# Patient Record
Sex: Female | Born: 1950
Health system: Southern US, Community
[De-identification: ages and names within clinical notes are randomized; demographics above are authoritative.]

## PROBLEM LIST (undated history)

## (undated) DIAGNOSIS — I10 Essential (primary) hypertension: Secondary | ICD-10-CM

## (undated) DIAGNOSIS — R0981 Nasal congestion: Secondary | ICD-10-CM

## (undated) DIAGNOSIS — E079 Disorder of thyroid, unspecified: Secondary | ICD-10-CM

## (undated) DIAGNOSIS — G20 Parkinson's disease: Secondary | ICD-10-CM

## (undated) DIAGNOSIS — G20A1 Parkinson's disease without dyskinesia, without mention of fluctuations: Secondary | ICD-10-CM

## (undated) DIAGNOSIS — G238 Other specified degenerative diseases of basal ganglia: Secondary | ICD-10-CM

## (undated) HISTORY — PX: TUBAL LIGATION: SHX77

## (undated) HISTORY — PX: CATARACT EXTRACTION, BILATERAL: SHX1313

## (undated) HISTORY — PX: NASAL SINUS SURGERY: SHX719

## (undated) HISTORY — DX: Nasal congestion: R09.81

---

## 1999-08-20 ENCOUNTER — Encounter: Payer: Self-pay | Admitting: Gynecology

## 1999-08-20 ENCOUNTER — Encounter: Admission: RE | Admit: 1999-08-20 | Discharge: 1999-08-20 | Payer: Self-pay | Admitting: Gynecology

## 1999-12-24 ENCOUNTER — Other Ambulatory Visit: Admission: RE | Admit: 1999-12-24 | Discharge: 1999-12-24 | Payer: Self-pay | Admitting: Gynecology

## 2000-11-08 ENCOUNTER — Encounter: Admission: RE | Admit: 2000-11-08 | Discharge: 2000-11-08 | Payer: Self-pay | Admitting: Gynecology

## 2000-11-08 ENCOUNTER — Encounter: Payer: Self-pay | Admitting: Gynecology

## 2001-01-04 ENCOUNTER — Other Ambulatory Visit: Admission: RE | Admit: 2001-01-04 | Discharge: 2001-01-04 | Payer: Self-pay | Admitting: Gynecology

## 2002-08-15 ENCOUNTER — Other Ambulatory Visit: Admission: RE | Admit: 2002-08-15 | Discharge: 2002-08-15 | Payer: Self-pay | Admitting: Gynecology

## 2003-08-01 ENCOUNTER — Encounter: Admission: RE | Admit: 2003-08-01 | Discharge: 2003-08-01 | Payer: Self-pay | Admitting: Gynecology

## 2003-08-19 ENCOUNTER — Other Ambulatory Visit: Admission: RE | Admit: 2003-08-19 | Discharge: 2003-08-19 | Payer: Self-pay | Admitting: Gynecology

## 2004-09-22 ENCOUNTER — Other Ambulatory Visit: Admission: RE | Admit: 2004-09-22 | Discharge: 2004-09-22 | Payer: Self-pay | Admitting: Gynecology

## 2005-07-27 ENCOUNTER — Encounter: Admission: RE | Admit: 2005-07-27 | Discharge: 2005-07-27 | Payer: Self-pay | Admitting: Gynecology

## 2005-08-12 ENCOUNTER — Encounter: Admission: RE | Admit: 2005-08-12 | Discharge: 2005-08-12 | Payer: Self-pay | Admitting: Gynecology

## 2005-08-25 ENCOUNTER — Encounter: Admission: RE | Admit: 2005-08-25 | Discharge: 2005-08-25 | Payer: Self-pay | Admitting: Gynecology

## 2005-10-05 ENCOUNTER — Other Ambulatory Visit: Admission: RE | Admit: 2005-10-05 | Discharge: 2005-10-05 | Payer: Self-pay | Admitting: Gynecology

## 2006-10-31 ENCOUNTER — Other Ambulatory Visit: Admission: RE | Admit: 2006-10-31 | Discharge: 2006-10-31 | Payer: Self-pay | Admitting: Gynecology

## 2007-11-03 ENCOUNTER — Encounter: Admission: RE | Admit: 2007-11-03 | Discharge: 2007-11-03 | Payer: Self-pay | Admitting: Gynecology

## 2009-01-17 ENCOUNTER — Encounter (INDEPENDENT_AMBULATORY_CARE_PROVIDER_SITE_OTHER): Payer: Self-pay | Admitting: *Deleted

## 2009-02-05 ENCOUNTER — Encounter: Admission: RE | Admit: 2009-02-05 | Discharge: 2009-02-05 | Payer: Self-pay | Admitting: Gynecology

## 2009-02-26 ENCOUNTER — Ambulatory Visit: Payer: Self-pay | Admitting: Internal Medicine

## 2009-02-26 DIAGNOSIS — R1031 Right lower quadrant pain: Secondary | ICD-10-CM | POA: Insufficient documentation

## 2009-02-26 DIAGNOSIS — J309 Allergic rhinitis, unspecified: Secondary | ICD-10-CM | POA: Insufficient documentation

## 2009-02-26 DIAGNOSIS — E559 Vitamin D deficiency, unspecified: Secondary | ICD-10-CM | POA: Insufficient documentation

## 2009-02-26 DIAGNOSIS — E785 Hyperlipidemia, unspecified: Secondary | ICD-10-CM | POA: Insufficient documentation

## 2009-02-26 DIAGNOSIS — M949 Disorder of cartilage, unspecified: Secondary | ICD-10-CM

## 2009-02-26 DIAGNOSIS — M899 Disorder of bone, unspecified: Secondary | ICD-10-CM | POA: Insufficient documentation

## 2009-03-03 ENCOUNTER — Ambulatory Visit: Payer: Self-pay | Admitting: Gastroenterology

## 2009-03-03 ENCOUNTER — Telehealth (INDEPENDENT_AMBULATORY_CARE_PROVIDER_SITE_OTHER): Payer: Self-pay | Admitting: *Deleted

## 2009-03-03 ENCOUNTER — Ambulatory Visit: Payer: Self-pay | Admitting: Internal Medicine

## 2009-03-03 ENCOUNTER — Encounter: Payer: Self-pay | Admitting: Family Medicine

## 2009-03-03 LAB — CONVERTED CEMR LAB
Ketones, urine, test strip: NEGATIVE
Protein, U semiquant: NEGATIVE
Urobilinogen, UA: 0.2
pH: 7.5

## 2009-03-05 ENCOUNTER — Encounter: Payer: Self-pay | Admitting: Internal Medicine

## 2009-03-05 LAB — CONVERTED CEMR LAB
BUN: 10 mg/dL (ref 6–23)
Basophils Relative: 0.3 % (ref 0.0–3.0)
Bilirubin, Direct: 0 mg/dL (ref 0.0–0.3)
Calcium: 9.2 mg/dL (ref 8.4–10.5)
Chloride: 100 meq/L (ref 96–112)
Eosinophils Absolute: 0.1 10*3/uL (ref 0.0–0.7)
GFR calc non Af Amer: 90.93 mL/min (ref >60)
Glucose, Bld: 81 mg/dL (ref 70–99)
HCT: 40.8 % (ref 36.0–46.0)
Lymphocytes Relative: 41.8 % (ref 12.0–46.0)
Lymphs Abs: 1.8 10*3/uL (ref 0.7–4.0)
MCHC: 33.7 g/dL (ref 30.0–36.0)
Monocytes Absolute: 0.4 10*3/uL (ref 0.1–1.0)
Neutro Abs: 2.1 10*3/uL (ref 1.4–7.7)
Neutrophils Relative %: 46.4 % (ref 43.0–77.0)
Platelets: 180 10*3/uL (ref 150.0–400.0)
Potassium: 3.8 meq/L (ref 3.5–5.1)
Total Bilirubin: 1 mg/dL (ref 0.3–1.2)
Total Protein: 7 g/dL (ref 6.0–8.3)
WBC: 4.4 10*3/uL — ABNORMAL LOW (ref 4.5–10.5)

## 2009-03-06 ENCOUNTER — Encounter (INDEPENDENT_AMBULATORY_CARE_PROVIDER_SITE_OTHER): Payer: Self-pay | Admitting: *Deleted

## 2009-03-13 ENCOUNTER — Telehealth (INDEPENDENT_AMBULATORY_CARE_PROVIDER_SITE_OTHER): Payer: Self-pay | Admitting: *Deleted

## 2009-03-19 ENCOUNTER — Ambulatory Visit: Payer: Self-pay | Admitting: Internal Medicine

## 2009-03-20 ENCOUNTER — Encounter: Payer: Self-pay | Admitting: Internal Medicine

## 2009-03-21 ENCOUNTER — Encounter (INDEPENDENT_AMBULATORY_CARE_PROVIDER_SITE_OTHER): Payer: Self-pay | Admitting: *Deleted

## 2009-03-21 LAB — CONVERTED CEMR LAB: Hep A IgM: NEGATIVE

## 2009-04-08 ENCOUNTER — Encounter (INDEPENDENT_AMBULATORY_CARE_PROVIDER_SITE_OTHER): Payer: Self-pay | Admitting: *Deleted

## 2009-04-09 ENCOUNTER — Ambulatory Visit: Payer: Self-pay | Admitting: Gastroenterology

## 2009-04-23 ENCOUNTER — Ambulatory Visit: Payer: Self-pay | Admitting: Gastroenterology

## 2009-06-17 ENCOUNTER — Encounter (INDEPENDENT_AMBULATORY_CARE_PROVIDER_SITE_OTHER): Payer: Self-pay | Admitting: *Deleted

## 2009-06-17 ENCOUNTER — Ambulatory Visit: Payer: Self-pay | Admitting: Gastroenterology

## 2009-06-23 ENCOUNTER — Ambulatory Visit (HOSPITAL_COMMUNITY): Admission: RE | Admit: 2009-06-23 | Discharge: 2009-06-23 | Payer: Self-pay | Admitting: Gastroenterology

## 2009-06-25 ENCOUNTER — Ambulatory Visit: Payer: Self-pay | Admitting: Gastroenterology

## 2009-06-25 DIAGNOSIS — K219 Gastro-esophageal reflux disease without esophagitis: Secondary | ICD-10-CM | POA: Insufficient documentation

## 2009-07-07 ENCOUNTER — Ambulatory Visit: Payer: Self-pay | Admitting: Internal Medicine

## 2009-07-07 DIAGNOSIS — J019 Acute sinusitis, unspecified: Secondary | ICD-10-CM | POA: Insufficient documentation

## 2009-07-16 ENCOUNTER — Telehealth: Payer: Self-pay | Admitting: Gastroenterology

## 2009-07-18 ENCOUNTER — Telehealth (INDEPENDENT_AMBULATORY_CARE_PROVIDER_SITE_OTHER): Payer: Self-pay | Admitting: *Deleted

## 2009-07-18 ENCOUNTER — Telehealth: Payer: Self-pay | Admitting: Family Medicine

## 2009-07-22 ENCOUNTER — Ambulatory Visit: Payer: Self-pay | Admitting: Gastroenterology

## 2009-07-22 ENCOUNTER — Telehealth: Payer: Self-pay | Admitting: Family Medicine

## 2009-07-28 ENCOUNTER — Telehealth: Payer: Self-pay | Admitting: Gastroenterology

## 2009-07-28 ENCOUNTER — Encounter: Payer: Self-pay | Admitting: Internal Medicine

## 2009-07-30 ENCOUNTER — Telehealth: Payer: Self-pay | Admitting: Gastroenterology

## 2009-08-07 ENCOUNTER — Telehealth: Payer: Self-pay | Admitting: Gastroenterology

## 2009-08-19 ENCOUNTER — Telehealth: Payer: Self-pay | Admitting: Gastroenterology

## 2009-09-11 ENCOUNTER — Encounter: Payer: Self-pay | Admitting: Internal Medicine

## 2010-06-14 ENCOUNTER — Encounter: Payer: Self-pay | Admitting: Gynecology

## 2010-06-23 NOTE — Assessment & Plan Note (Signed)
Summary: DRAINAGE IN HEAD/KDC   Vital Signs:  Patient profile:   60 year old female Weight:      157 pounds O2 Sat:      99 % on Room air Temp:     98.1 degrees F oral Pulse rate:   75 / minute Resp:     14 per minute BP sitting:   110 / 78  (left arm)  Vitals Entered By: Jeremy Johann CMA (July 07, 2009 12:51 PM)  O2 Flow:  Room air CC: head congestion,cough, greenish mucous, drainage x2weeks Comments REVIEWED MED LIST, PATIENT AGREED DOSE AND INSTRUCTION CORRECT    Primary Care Provider:  Marga Melnick, MD  CC:  head congestion, cough, greenish mucous, and drainage x2weeks.  History of Present Illness: Onset 06/27/2009 as head congestion with PNDr & ST. Cough with green secretions as of 02/11. Rx: Rx cough syrup (codeine / Robitussin), Mucinex D in am & Plain at bedtime   Allergies: 1)  ! Amoxicillin  Review of Systems General:  Denies chills, fever, and sweats; "Hot " @ night. ENT:  Complains of nasal congestion and sinus pressure; Frontal headache , facial pain & purulence. Resp:  Complains of sputum productive; denies chest pain with inspiration, shortness of breath, and wheezing.  Physical Exam  General:  well-nourished; alert,appropriate and cooperative throughout examination Ears:  External ear exam shows no significant lesions or deformities.  Otoscopic examination reveals clear canals, tympanic membranes are intact bilaterally without bulging, retraction, inflammation or discharge. Hearing is grossly normal bilaterally. Nose:  External nasal examination shows no deformity or inflammation. Nasal mucosa are pink and moist without lesions or exudates. Mouth:  Oral mucosa and oropharynx without lesions or exudates.  Teeth in good repair. Lungs:  Normal respiratory effort, chest expands symmetrically. Lungs are clear to auscultation, no crackles or wheezes. Skin:  Intact without suspicious lesions or rashes Cervical Nodes:  Shotty  LA on L Axillary Nodes:  No  palpable lymphadenopathy   Impression & Recommendations:  Problem # 1:  SINUSITIS- ACUTE-NOS (ICD-461.9)  Her updated medication list for this problem includes:    Clarithromycin 500 Mg Xr24h-tab (Clarithromycin) .Marland Kitchen... 2 once daily with food  Problem # 2:  BRONCHITIS-ACUTE (ICD-466.0)  Her updated medication list for this problem includes:    Clarithromycin 500 Mg Xr24h-tab (Clarithromycin) .Marland Kitchen... 2 once daily with food  Complete Medication List: 1)  Climara 0.0375 Mg/24hr Ptwk (Estradiol) .... Apply 1 patch weelky 2)  Prometrium 200 Mg Caps (Progesterone micronized) .Marland Kitchen.. 1 by mouth at bedtime days 1-2 of each month 3)  Multivitamins Tabs (Multiple vitamin) .Marland Kitchen.. 1 tablet by mouth once daily 4)  Vitamin D 2000 Unit Tabs (Cholecalciferol) .Marland Kitchen.. 1 tablet by mouth once daily 5)  Calcium 600 1500 Mg Tabs (Calcium carbonate) .Marland Kitchen.. 1 tablet by mouth two times a day 6)  Fish Oil 1000 Mg Caps (Omega-3 fatty acids) .Marland Kitchen.. 1 capsule by mouth two times a day 7)  Mastic Gum Tears Gum (Mastic) .Marland Kitchen.. 1 by mouth two times a day 8)  Golden Seal Root 500 Mg Caps (Goldenseal) .... Take with meals 9)  Zinc Gluconate 20 Mg Tabs (Zinc gluconate) .Marland Kitchen.. 1 by mouth once daily 10)  Niacin Cr 500 Mg Cr-caps (Niacin) .Marland Kitchen.. 1 tablet by mouth two times a day 11)  Dexilant 60 Mg Cpdr (Dexlansoprazole) .... Take 1 tab once daily 12)  Clarithromycin 500 Mg Xr24h-tab (Clarithromycin) .... 2 once daily with food  Patient Instructions: 1)  Avoid decongestants ; use Neti pot  once daily for congestion. Use "crossover technique" with nasal spray as discussed. 2)  Drink as much fluid as you can tolerate for the next few days. Prescriptions: CLARITHROMYCIN 500 MG XR24H-TAB (CLARITHROMYCIN) 2 once daily WITH FOOD  #20 x 0   Entered and Authorized by:   Marga Melnick MD   Signed by:   Marga Melnick MD on 07/07/2009   Method used:   Faxed to ...       [id number]       [address]       [place], [place]   [address]       Ph: [phone] or [phone]       Fax: [phone]   RxID:   [id number]

## 2010-06-23 NOTE — Procedures (Signed)
Summary: Upper Endoscopy  Patient: [name] Note: All result statuses are Final unless otherwise noted.  Tests: (1) Upper Endoscopy (EGD)   EGD Upper Endoscopy       DONE     [facility]     [address]     [place], [place]  [address]           ENDOSCOPY PROCEDURE REPORT           PATIENT:  Morgan, Reese  MR#:  [id number]     BIRTHDATE:  May 17, 1951, 58 yrs. old  GENDER:  female           ENDOSCOPIST:  Vania Rea. Jarold Motto, MD, [facility]     Referred by:           PROCEDURE DATE:  06/25/2009     PROCEDURE:  EGD with biopsy     ASA CLASS:  Class II     INDICATIONS:  abdominal pain           MEDICATIONS:   Fentanyl 50 mcg IV, Versed 4 mg IV     TOPICAL ANESTHETIC:  Exactacain Spray           DESCRIPTION OF PROCEDURE:   After the risks benefits and     alternatives of the procedure were thoroughly explained, informed     consent was obtained.  The LB GIF-H180 [id number] endoscope was     introduced through the mouth and advanced to the second portion of     the duodenum, without limitations.  The instrument was slowly     withdrawn as the mucosa was fully examined.     <<PROCEDUREIMAGES>>           A hiatal hernia was found. 5 CM. PROLAPSING HIATIAL HERNIA NOTED.     Normal duodenal folds were noted.  The stomach was entered and     closely examined. The antrum, angularis, and lesser curvature were     well visualized, including a retroflexed view of the cardia and     fundus. The stomach wall was normally distensable. The scope     passed easily through the pylorus into the duodenum. CLO BX. FOR     H.PYLORI DONE.  The esophagus and gastroesophageal junction were     completely normal in appearance.    Retroflexed views revealed a     hiatal hernia.    The scope was then withdrawn from the patient     and the procedure completed.           COMPLICATIONS:  None           ENDOSCOPIC IMPRESSION:     1) Hiatal hernia     2) Normal duodenal folds     3) Normal  stomach     4) Normal esophagus     PROBABLE RECURRENT GERD FROM MODERATE SIZED HH.     RECOMMENDATIONS:     1) anti-reflux regimen to be follow     2) follow-up: office 1 month(s)     TRIAL OF DEXILANT 60 MG./QAM.REFLUX REGIME.           REPEAT EXAM:  No           ______________________________     Vania Rea. Jarold Motto, MD, [facility]           CC:  Pecola Lawless, MD           n.     Rosalie DoctorMarland Kitchen   Vania Rea. Jarold Motto  at 06/25/2009 03:29 PM           Barry Brunner, [id number]  Note: An exclamation mark (!) indicates a result that was not dispersed into the flowsheet. Document Creation Date: 06/25/2009 3:30 PM _______________________________________________________________________  (1) Order result status: Final Collection or observation date-time: 06/25/2009 15:22 Requested date-time:  Receipt date-time:  Reported date-time:  Referring Physician:   Ordering Physician: Sheryn Bison [phone]) Specimen Source:  Source: Launa Grill Order Number: [id number] Lab site:

## 2010-06-23 NOTE — Progress Notes (Signed)
Summary: talk to nurse about a medicine she stop due to side effect  Phone Note Call from Patient Call back at Home Phone [phone]   Call For: Dr Jarold Motto Summary of Call: Wants to discuss the medicine that was giving her side effects. Was told to call back one week after not using it. Initial call taken by: Leanor Kail [facility],  August 19, 2009 10:52 AM  Follow-up for Phone Call        Pt states she feels better since stopping the omeprazole.  She will leave it off for now.  No problems with gerd at present time.  Pt wants to tell Dr. Jarold Motto "thank you"  for referring her to the ENT.  She will be having surgery on her sinus soon.   Follow-up by: Ashok Cordia RN,  August 20, 2009 8:52 AM  Additional Follow-up for Phone Call Additional follow up Details #1::        ok to stop Additional Follow-up by: Mardella Layman MD [facility],  August 20, 2009 9:05 AM

## 2010-06-23 NOTE — Op Note (Signed)
Summary: [facility]  Sinus Surgery/[facility]   Imported By: Lanelle Bal 10/01/2009 08:15:28  _____________________________________________________________________  External Attachment:    Type:   Image     Comment:   External Document

## 2010-06-23 NOTE — Assessment & Plan Note (Signed)
Summary: PAIN IN RIGHT SIDE CAME BACK/YF   History of Present Illness Visit Type: Follow-up Visit Primary GI MD: Sheryn Bison MD FACP FAGA Primary Provider: Marga Melnick, MD Requesting Provider: Marga Melnick, MD Chief Complaint: pain in rt. side with burning in upper abdomen  clearing throat alot  History of Present Illness:   This patient is a 60 year old Caucasian female who has a chronic recurrent right mid abdominal pain of unexplained etiology. Colonoscopy exam was unremarkable. She now complains of dyspepsia and periodic burning epigastric pain and occasional right upper quadrant pain. She's been using antacids with mild improvement. She is worried that she has H. pylori infection. She denies typical reflux symptoms, history peptic ulcer disease, or any hepatobiliary or lower gastrointestinal problems except for mild chronic constipation. She denies abuse of alcohol, cigarettes, or NSAIDs.   GI Review of Systems    Reports nausea.     Location of  Abdominal pain: generalized.    Denies abdominal pain, acid reflux, belching, bloating, chest pain, dysphagia with liquids, dysphagia with solids, heartburn, loss of appetite, vomiting, vomiting blood, weight loss, and  weight gain.      Reports constipation.     Denies anal fissure, black tarry stools, change in bowel habit, diarrhea, diverticulosis, fecal incontinence, heme positive stool, hemorrhoids, irritable bowel syndrome, jaundice, light color stool, liver problems, rectal bleeding, and  rectal pain.    Current Medications (verified): 1)  Climara 0.0375 Mg/24hr Ptwk (Estradiol) .... Apply 1 Patch Weelky 2)  Prometrium 200 Mg Caps (Progesterone Micronized) .Marland Kitchen.. 1 By Mouth At Bedtime Days 1-2 of Each Month 3)  Multivitamins  Tabs (Multiple Vitamin) .Marland Kitchen.. 1 Tablet By Mouth Once Daily 4)  Vitamin D 2000 Unit Tabs (Cholecalciferol) .Marland Kitchen.. 1 Tablet By Mouth Once Daily 5)  Calcium 600 1500 Mg Tabs (Calcium Carbonate) .Marland Kitchen.. 1 Tablet By  Mouth Two Times A Day 6)  Fish Oil 1000 Mg Caps (Omega-3 Fatty Acids) .Marland Kitchen.. 1 Capsule By Mouth Two Times A Day 7)  Align  Caps (Probiotic Product) .Marland Kitchen.. 1 Capsule By Mouth Once Daily 8)  Mastic Gum Tears  Gum (Mastic) .Marland Kitchen.. 1 By Mouth Two Times A Day 9)  Golden Seal Root 500 Mg Caps (Goldenseal) .... Take With Meals 10)  Zinc Gluconate 20 Mg Tabs (Zinc Gluconate) .Marland Kitchen.. 1 By Mouth Once Daily 11)  Niacin Cr 500 Mg Cr-Caps (Niacin) .Marland Kitchen.. 1 Tablet By Mouth Two Times A Day  Allergies (verified): 1)  ! Amoxicillin  Past History:  Past medical, surgical, family and social histories (including risk factors) reviewed for relevance to current acute and chronic problems.  Past Medical History: Reviewed history from 02/26/2009 and no changes required. Ulcerative Colitis Allergic rhinitis Osteopenia, BMD  by Dr Nicholas Lose Vitamin D deficiency Hyperlipidemia, Dr Nicholas Lose  Past Surgical History: Reviewed history from 02/26/2009 and no changes required. G2 P2; reconstructive surgery Tonsillectomy  Family History: Reviewed history from 03/03/2009 and no changes required. Father: Deceased, lung CA, Mental  Health Concerns; P Paternal Grandmother-insulin/DM,colon cancer; Mother: Living,Mental  Health (depression) Resp. problems,IBS;M  Aunts-Allergies & Arthritis Siblings: 2  sisters Schizophrenia; 1 sister Lupus; 1 sister colon polyp Family History of Diabetes: Paternal Grandmother  Social History: Reviewed history from 03/03/2009 and no changes required. Married  1 boy and 1 girl Never Smoked Alcohol use-yes: rare  Regular exercise-yes: walking or [employer] 5-6X/week Daily Caffeine Use   2 cups per day Illicit Drug Use - no  Review of Systems       The patient complains  of allergy/sinus and voice change.  The patient denies anemia, anxiety-new, arthritis/joint pain, back pain, blood in urine, breast changes/lumps, change in vision, confusion, cough, coughing up blood, depression-new, fainting, fatigue,  fever, headaches-new, hearing problems, heart murmur, heart rhythm changes, itching, muscle pains/cramps, night sweats, nosebleeds, shortness of breath, skin rash, sleeping problems, sore throat, swelling of feet/legs, swollen lymph glands, thirst - excessive, urination - excessive, urination changes/pain, urine leakage, and vision changes.    Vital Signs:  Patient profile:   60 year old female Height:      63 inches Weight:      156.38 pounds BMI:     27.80 Pulse rate:   88 / minute Pulse rhythm:   regular BP sitting:   110 / 68  (left arm)  Vitals Entered By: Milford Cage NCMA (June 17, 2009 3:44 PM)  Physical Exam  General:  Well developed, well nourished, no acute distress.healthy appearing.   Head:  Normocephalic and atraumatic. Eyes:  PERRLA, no icterus.exam deferred to patient's ophthalmologist.   Chest Wall:  Symmetrical;  no deformities or tenderness. Breasts:  No masses, tenderness or gynecomastia noted. Lungs:  Clear throughout to auscultation. Heart:  Regular rate and rhythm; no murmurs, rubs,  or bruits. Abdomen:  Soft, nontender and nondistended. No masses, hepatosplenomegaly or hernias noted. Normal bowel sounds. Extremities:  No clubbing, cyanosis, edema or deformities noted. Neurologic:  Alert and  oriented x4;  grossly normal neurologically. Inguinal Nodes:  No significant inguinal adenopathy. Psych:  Alert and cooperative. Normal mood and affect.   Impression & Recommendations:  Problem # 1:  ABDOMINAL PAIN, RIGHT LOWER QUADRANT (ICD-789.03) Assessment Unchanged To Complete Her Workup I have scheduled ultrasound and endoscopic exam with p.r.n. Pepcid a.c. use. We will check her for exploratory time of her endoscopic exam.  Problem # 2:  VITAMIN D DEFICIENCY (ICD-268.9) Assessment: Improved continue other multiple medications and mineral and vitamin supplements as per Dr. Alwyn Ren.  Patient Instructions: 1)  Copy sent to : Dr. Marga Melnick 2)  Please  continue current medications.  3)  Conscious Sedation brochure given.  4)  Upper Endoscopy brochure given.  5)  upper abdominal ultrasound exam 6)  P.r.n. Pepcid AC use 7)  The medication list was reviewed and reconciled.  All changed / newly prescribed medications were explained.  A complete medication list was provided to the patient / caregiver.  Appended Document: PAIN IN RIGHT SIDE CAME BACK/YF    Clinical Lists Changes  Orders: Added new Test order of EGD (EGD) - Signed Added new Test order of Ultrasound Abdomen (UAS) - Signed      Appended Document: PAIN IN RIGHT SIDE CAME BACK/YF PROBLEM #1.Marland KitchenMarland KitchenSHOULD READ.Marland Kitchen"WE WILL CHECK HER FOR H.PYLORI EXAM..Marland Kitchen"

## 2010-06-23 NOTE — Miscellaneous (Signed)
Summary: clotest  Clinical Lists Changes  Problems: Added new problem of GERD (ICD-530.81) Orders: Added new Test order of TLB-H Pylori Screen Gastric Biopsy (83013-CLOTEST) - Signed 

## 2010-06-23 NOTE — Progress Notes (Signed)
Summary: samples  Phone Note Call from Patient Call back at Home Phone [phone]   Caller: Patient Call For: Jarold Motto Reason for Call: Talk to Nurse Summary of Call: Patient states that she needs more samples of Dexilant until her appt 3-1 Initial call taken by: Tawni Levy,  July 16, 2009 9:13 AM  Follow-up for Phone Call        Samples given.  Pt notified.   Follow-up by: Ashok Cordia RN,  July 16, 2009 9:52 AM    New/Updated Medications: DEXILANT 60 MG CPDR (DEXLANSOPRAZOLE) take 1 tab once daily

## 2010-06-23 NOTE — Letter (Signed)
Summary: EGD Instructions  [facility]  [address] [place], [place] [address]   Phone: [phone]  Fax: [phone]       Morgan Reese    03-02-1951    MRN: [id number]       Procedure Day Dorna Bloom: Wednesday, 06/25/09     Arrival Time:  2:30     Procedure Time: 3:30     Location of Procedure:                    _X  _ [facility] (4th Floor)    PREPARATION FOR ENDOSCOPY   On 06/25/09 THE DAY OF THE PROCEDURE:  1.   No solid foods, milk or milk products are allowed after midnight the night before your procedure.  2.   Do not drink anything colored red or purple.  Avoid juices with pulp.  No orange juice.  3.  You may drink clear liquids until 1:30, which is 2 hours before your procedure.                                                                                                CLEAR LIQUIDS INCLUDE: Water Jello Ice Popsicles Tea (sugar ok, no milk/cream) Powdered fruit flavored drinks Coffee (sugar ok, no milk/cream) Gatorade Juice: apple, white grape, white cranberry  Lemonade Clear bullion, consomm, broth Carbonated beverages (any kind) Strained chicken noodle soup Hard Candy   MEDICATION INSTRUCTIONS  Unless otherwise instructed, you should take regular prescription medications with a small sip of water as early as possible the morning of your procedure.                     OTHER INSTRUCTIONS  You will need a responsible adult at least 61 years of age to accompany you and drive you home.   This person must remain in the waiting room during your procedure.  Wear loose fitting clothing that is easily removed.  Leave jewelry and other valuables at home.  However, you may wish to bring a book to read or an iPod/MP3 player to listen to music as you wait for your procedure to start.  Remove all body piercing jewelry and leave at home.  Total time from sign-in until discharge is approximately 2-3 hours.  You should  go home directly after your procedure and rest.  You can resume normal activities the day after your procedure.  The day of your procedure you should not:   Drive   Make legal decisions   Operate machinery   Drink alcohol   Return to work  You will receive specific instructions about eating, activities and medications before you leave.    The above instructions have been reviewed and explained to me by   _______________________    I fully understand and can verbalize these instructions _____________________________ Date _________

## 2010-06-23 NOTE — Consult Note (Signed)
Summary: Hermelinda Medicus MD ENT  Hermelinda Medicus MD ENT   Imported By: Lanelle Bal 08/11/2009 11:37:09  _____________________________________________________________________  External Attachment:    Type:   Image     Comment:   External Document

## 2010-06-23 NOTE — Progress Notes (Signed)
Summary: rx called in  Phone Note Call from Patient Call back at Home Phone [phone]   Caller: Patient Call For: Kyrin Gratz Reason for Call: Talk to Nurse Summary of Call: Patient needs an rx for Dex[facility]. Initial call taken by: Tawni Levy,  July 28, 2009 8:26 AM  Follow-up for Phone Call        Rx sent.    Prescriptions: DEXILANT 60 MG CPDR (DEXLANSOPRAZOLE) take 1 tab once daily  #30 x 6   Entered by:   Ashok Cordia RN   Authorized by:   Mardella Layman MD [facility]   Signed by:   Ashok Cordia RN on 07/28/2009   Method used:   Electronically to        [id number]       [address]       [place], [place]  [address]       Ph: [phone] or [phone]       Fax: [phone]   RxID:   [id number]

## 2010-06-23 NOTE — Assessment & Plan Note (Signed)
Summary: 2 week follow up/dfs   History of Present Illness Visit Type: Follow-up Visit Primary GI MD: Sheryn Bison MD FACP FAGA Primary Provider: Marga Melnick, MD Requesting Provider: Marga Melnick, MD Chief Complaint: abdominal pain has improved, patient taking antibiotic for sinus infection History of Present Illness:   Morgan Reese continues with severe sinusitis problems despite repeated courses of antibiotics. She desires ENT referral.  Her abdominal pain seemed to resolve with treatment for acid reflux. There is some question as to whether or not her ENT problems are related to GERD. In any case, endoscopy showed a prominent hiatal hernia she is onDexilant 60 mg a day. Upper abdominal ultrasound exam was unremarkable. She denies any specific hepatobiliary or lower GI complaints at this time.   GI Review of Systems    Reports abdominal pain.      Denies acid reflux, belching, bloating, chest pain, dysphagia with liquids, dysphagia with solids, heartburn, loss of appetite, nausea, vomiting, vomiting blood, weight loss, and  weight gain.        Denies anal fissure, black tarry stools, change in bowel habit, constipation, diarrhea, diverticulosis, fecal incontinence, heme positive stool, hemorrhoids, irritable bowel syndrome, jaundice, light color stool, liver problems, rectal bleeding, and  rectal pain.    Current Medications (verified): 1)  Climara 0.0375 Mg/24hr Ptwk (Estradiol) .... Apply 1 Patch Weelky 2)  Prometrium 200 Mg Caps (Progesterone Micronized) .Marland Kitchen.. 1 By Mouth At Bedtime Days 1-2 of Each Month 3)  Multivitamins  Tabs (Multiple Vitamin) .Marland Kitchen.. 1 Tablet By Mouth Once Daily 4)  Vitamin D 2000 Unit Tabs (Cholecalciferol) .Marland Kitchen.. 1 Tablet By Mouth Once Daily 5)  Calcium 600 1500 Mg Tabs (Calcium Carbonate) .Marland Kitchen.. 1 Tablet By Mouth Two Times A Day 6)  Fish Oil 1000 Mg Caps (Omega-3 Fatty Acids) .Marland Kitchen.. 1 Capsule By Mouth Two Times A Day 7)  Zinc Gluconate 20 Mg Tabs (Zinc Gluconate)  .Marland Kitchen.. 1 By Mouth Once Daily 8)  Niacin Cr 500 Mg Cr-Caps (Niacin) .Marland Kitchen.. 1 Tablet By Mouth Two Times A Day 9)  Dexilant 60 Mg Cpdr (Dexlansoprazole) .... Take 1 Tab Once Daily 10)  Avelox 400 Mg Tabs (Moxifloxacin Hcl) .... Take 1 By Mouth Once Daily  Allergies (verified): 1)  ! Amoxicillin  Past History:  Past medical, surgical, family and social histories (including risk factors) reviewed for relevance to current acute and chronic problems.  Past Medical History: Reviewed history from 02/26/2009 and no changes required. Ulcerative Colitis Allergic rhinitis Osteopenia, BMD  by Dr Nicholas Lose Vitamin D deficiency Hyperlipidemia, Dr Nicholas Lose  Past Surgical History: G2 P2; reconstructive surgery Tonsillectomy Tubal Ligation  Family History: Reviewed history from 03/03/2009 and no changes required. Father: Deceased, lung CA, Mental  Health Concerns; P Paternal Grandmother-insulin/DM,colon cancer; Mother: Living,Mental  Health (depression) Resp. problems,IBS;M  Aunts-Allergies & Arthritis Siblings: 2  sisters Schizophrenia; 1 sister Lupus; 1 sister colon polyp Family History of Diabetes: Paternal Grandmother  Social History: Reviewed history from 03/03/2009 and no changes required. Married  1 boy and 1 girl Never Smoked Alcohol use-yes: rare  Regular exercise-yes: walking or [employer] 5-6X/week Daily Caffeine Use   2 cups per day Illicit Drug Use - no  Review of Systems       The patient complains of allergy/sinus, cough, fever, headaches-new, shortness of breath, and sore throat.  The patient denies anemia, anxiety-new, arthritis/joint pain, back pain, blood in urine, breast changes/lumps, change in vision, confusion, coughing up blood, depression-new, fainting, fatigue, hearing problems, heart murmur, heart rhythm changes, itching, menstrual  pain, muscle pains/cramps, night sweats, nosebleeds, pregnancy symptoms, skin rash, sleeping problems, swelling of feet/legs, swollen lymph glands,  thirst - excessive , urination - excessive , urine leakage, vision changes, and voice change.         No history of drainage from them. ENT:  Complains of decreased hearing, nasal congestion, and hoarseness; denies earache, ear discharge, tinnitus, loss of smell, nosebleeds, sore throat, and difficulty swallowing.  Vital Signs:  Patient profile:   60 year old female Height:      63 inches Weight:      155.13 pounds BMI:     27.58 Pulse rate:   84 / minute Pulse rhythm:   regular BP sitting:   132 / 76  (left arm) Cuff size:   regular  Vitals Entered By: June McMurray CMA Duncan Dull) (July 22, 2009 2:05 PM)  Physical Exam  General:  Well developed, well nourished, no acute distress.healthy appearing.   Head:  Normocephalic and atraumatic. Eyes:  PERRLA, no icterus.exam deferred to patient's ophthalmologist.   Nose:  No deformity, discharge,  or lesions.Tenderness noted over frontal and maxillary sinuses with some nasal congestion. Abdomen:  Soft, nontender and nondistended. No masses, hepatosplenomegaly or hernias noted. Normal bowel sounds. Psych:  Alert and cooperative. Normal mood and affect.   Impression & Recommendations:  Problem # 1:  SINUSITIS- ACUTE-NOS (ICD-461.9) Assessment Deteriorated Her sinusitis continues to worsen and I have referred her to Dr. Hermelinda Medicus in ENT for exam and perhaps CT scan and/or allergy testing. I have not prescribed additional antibiotics at this time.  Problem # 2:  GERD (ICD-530.81) Assessment: Improved Continue reflux maneuvers and daily PPI therapy.  Problem # 3:  FM HX MALIGNANT NEOPLASM GASTROINTESTINAL TRACT (ICD-V16.0) Assessment: Unchanged  Patient Instructions: 1)  Copy sent to : Dr. Hermelinda Medicus in ENT and Dr. Marga Melnick 2)  Please continue current medications.  3)  Avoid foods high in acid content ( tomatoes, citrus juices, spicy foods) . Avoid eating within 3 to 4 hours of lying down or before exercising. Do not over  eat; try smaller more frequent meals. Elevate head of bed four inches when sleeping.  4)  Please schedule a follow-up appointment in 1 month.  5)  The medication list was reviewed and reconciled.  All changed / newly prescribed medications were explained.  A complete medication list was provided to the patient / caregiver.  Appended Document: 2 week follow up/dfs    Clinical Lists Changes  Orders: Added new Test order of Misc. Referral (Misc. Ref) - Signed

## 2010-06-23 NOTE — Progress Notes (Signed)
Summary: Samples of medication  Phone Note Call from Patient Call back at Home Phone [phone]   Caller: Patient Call For: Dr. Jarold Motto Reason for Call: Refill Medication Summary of Call: Pt.'s Ins. is needing prior auth. for her Dexilant and she wants to know if have any samples until it is approved Initial call taken by: Karna Christmas,  July 28, 2009 4:01 PM  Follow-up for Phone Call        samples given.  Lm for pt. Follow-up by: Ashok Cordia RN,  July 28, 2009 4:20 PM

## 2010-06-23 NOTE — Progress Notes (Signed)
Summary: Dexilant prior auth  Phone Note Outgoing Call   Call placed by: Ashok Cordia RN,  July 30, 2009 9:57 AM Summary of Call: Dexilant requires prior auth.  [facility]ed [facility].  Pt must try a generic, either omeprazole, pantoprazole or lansoprazole for 30 days  before insurance will consider paying for dexilant. Initial call taken by: Ashok Cordia RN,  July 30, 2009 9:58 AM  Follow-up for Phone Call        LM for pt to call. Lupita Leash Surface RN  July 30, 2009 10:01 AM  Talked with pt.  She is willing to try on of the generic meds.  Which one does Dr. Jarold Motto suggest? Also pt asks if it is OK to take digestive enzymes along with the PPI?  Pt gets these enzymes from the health food store and she takes them as needed when she ests something she thinks will be hard to digest. Follow-up by: Ashok Cordia RN,  July 30, 2009 10:13 AM  Additional Follow-up for Phone Call Additional follow up Details #1::        ANY ARE OK...NO IDEA PER OTHER REQUEST... Additional Follow-up by: Mardella Layman MD Percell Belt 10:35 AM    Additional Follow-up for Phone Call Additional follow up Details #2::    Pt notified. Follow-up by: Ashok Cordia RN,  July 30, 2009 10:57 AM  New/Updated Medications: OMEPRAZOLE 20 MG  CPDR (OMEPRAZOLE) 1 each day 30 minutes before meal Prescriptions: OMEPRAZOLE 20 MG  CPDR (OMEPRAZOLE) 1 each day 30 minutes before meal  #30 x 6   Entered by:   Ashok Cordia RN   Authorized by:   Mardella Layman MD [facility]   Signed by:   Ashok Cordia RN on 07/30/2009   Method used:   [facility]ronically to        [id number]       [address]       [place], [place]  [address]       Ph: [phone] or [phone]       Fax: [phone]   RxID:   [id number]

## 2010-06-23 NOTE — Progress Notes (Signed)
Summary: no better  Phone Note Call from Patient   Caller: Patient Summary of Call: pt still c/o drainage, itchy throat, ear discomfort, lymph nodes swollen, chest congestion and cough. pt denies fever, SOB. [facility] advise in absent of dr hopp.............Marland KitchenFelecia Deloach CMA  July 18, 2009 9:38 AM   Follow-up for Phone Call        avelox 1 by mouth once daily #10  ---ov if no better after that Follow-up by: Loreen Freud DO,  July 18, 2009 10:05 AM  Additional Follow-up for Phone Call Additional follow up Details #1::        pt aware rx sent to pharmacy.............Marland KitchenFelecia Deloach CMA  July 18, 2009 10:20 AM     New/Updated Medications: AVELOX 400 MG TABS (MOXIFLOXACIN HCL) Take 1 by mouth once daily Prescriptions: AVELOX 400 MG TABS (MOXIFLOXACIN HCL) Take 1 by mouth once daily  #10 x 0   Entered by:   Jeremy Johann CMA   Authorized by:   Loreen Freud DO   Signed by:   Jeremy Johann CMA on 07/18/2009   Method used:   Faxed to ...       [id number]       [address]       [place], [place]  [address]       Ph: [phone] or [phone]       Fax: [phone]   RxID:   [id number]

## 2010-06-23 NOTE — Progress Notes (Signed)
Summary: side effect?  Phone Note Call from Patient   Caller: Patient Call For: Dr. Jarold Motto Reason for Call: Talk to Nurse Summary of Call: pt thinks she is having a reaction to Omeprazole... "spine feels funny" Initial call taken by: Vallarie Mare,  August 07, 2009 1:53 PM  Follow-up for Phone Call        Pt c/o pain in back and ribs at night.  wakes up and notices the discomfort.  Does not bother her during the day.  Has been told that she has osteopenia.  She wonders if omeprazole is causing a problem with absorbtion of calcium and vit d.  Pt would like to leave off the omeprazole for a while and see how symptoms change.  the upper abd pain that she was having has subsided.  Pt will report back in one week re symptoms off omeprazole. Follow-up by: Ashok Cordia RN,  August 07, 2009 2:19 PM

## 2010-06-23 NOTE — Progress Notes (Signed)
  Phone Note Call from Patient   Summary of Call: Pt states that she took 6 Avelox on accident. Poision Control told them that there were no adverse effects up to 10 grams. She states that she is feeling fine. Spoke with Dr.Lowne and informed the pt that she may experience diarrhea and vomitting. If this happens and she experiences any SOB or CP to please go to the ER. Pt is aware and understood. Army Fossa CMA  July 18, 2009 2:01 PM

## 2010-06-23 NOTE — Progress Notes (Signed)
Summary: not enough med  Phone Note Call from Patient Call back at Home Phone [phone]   Caller: Patient Summary of Call: Pt was rx avelox 1 by mouth once daily #10 but she took 6 on first day so she will not have enough med to last the full 10 days as rx. pt would like to know if she needs to get more of the med to last 10 day or will the 4 days of med plus the 6 on first day be enough to get rid of the sinus infection.pls advise............Marland KitchenFelecia Deloach CMA  July 22, 2009 10:24 AM   Follow-up for Phone Call        She may not need anymore.  How is she feeling? Follow-up by: Loreen Freud DO,  July 22, 2009 12:56 PM  Additional Follow-up for Phone Call Additional follow up Details #1::        PT STATES THAT SHE IS FEELING BETTER BUT WHEN SHE USED [facility] TODAY SHE STILL IS SEEING YELLOW MUCOUS. PT DID HAVE FEVER OF 100 ON FRI,SAT, AND SUN. PT STILL C/O HEAD CONGESTION.PT DENIES ANY COUGH, FEVER,SOB. [facility]................Marland KitchenFelecia Deloach CMA  July 22, 2009 1:06 PM     Additional Follow-up for Phone Call Additional follow up Details #2::    6 more sent to pharmacy Follow-up by: Loreen Freud DO,  July 22, 2009 2:31 PM  Additional Follow-up for Phone Call Additional follow up Details #3:: Details for Additional Follow-up Action Taken: left pt detail message rx sent to pharmacy..............Marland KitchenFelecia Deloach CMA  July 22, 2009 2:51 PM   Prescriptions: AVELOX 400 MG TABS (MOXIFLOXACIN HCL) Take 1 by mouth once daily  #6 x 0   Entered and Authorized by:   Loreen Freud DO   Signed by:   Loreen Freud DO on 07/22/2009   Method used:   [phone]ail[facility]       [address]       [place], [place]  [address]       Ph: [phone] or [phone]       Fax: [phone]   RxID:   [id number]

## 2010-09-17 IMAGING — US US ABDOMEN COMPLETE
1 series · 14 of 25 positions shown · non-contrast
Comparison: None.

CLINICAL DATA: Right upper quadrant pain

COMPLETE ABDOMINAL ULTRASOUND

[Series 1: us abdomen complete · 0.30mm/px · 14 of 66 slices shown]
[im 1/66]
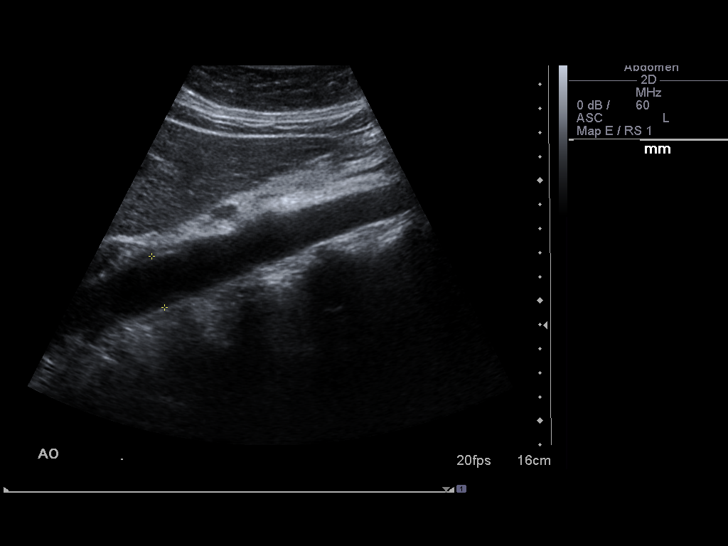
[im 6/66]
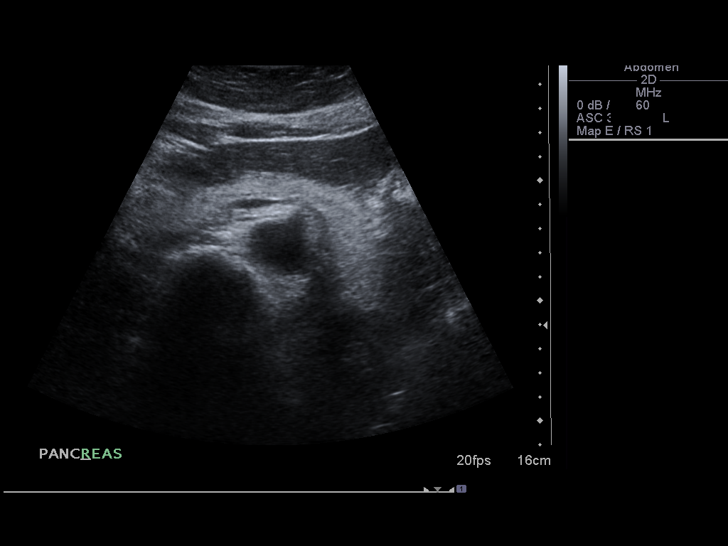
[im 11/66]
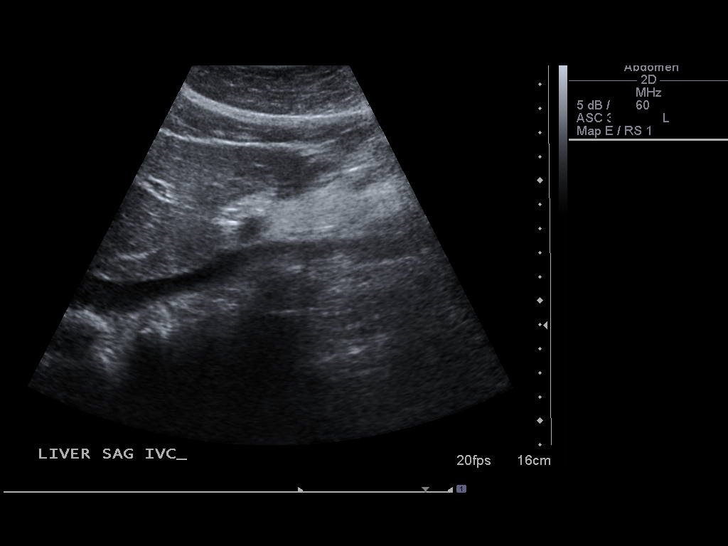
[im 17/66]
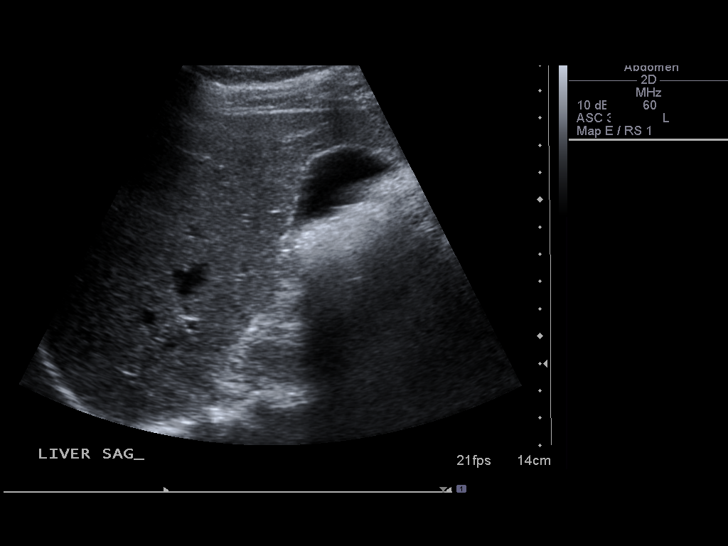
[im 22/66]
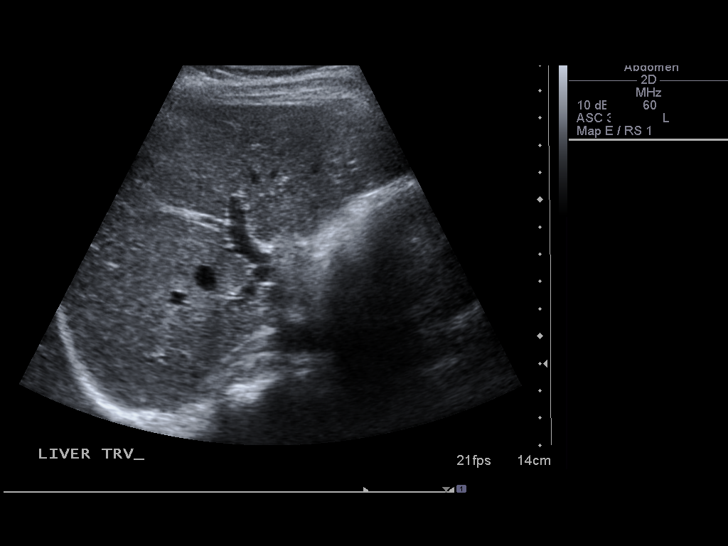
[im 25/66]
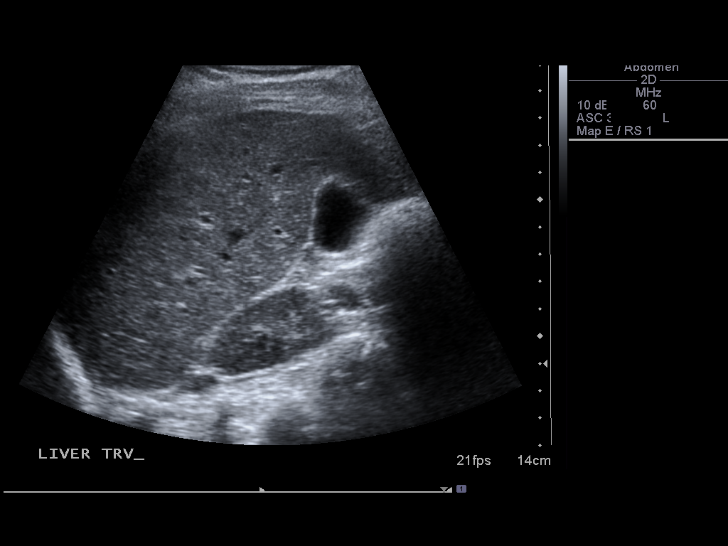
[im 30/66]
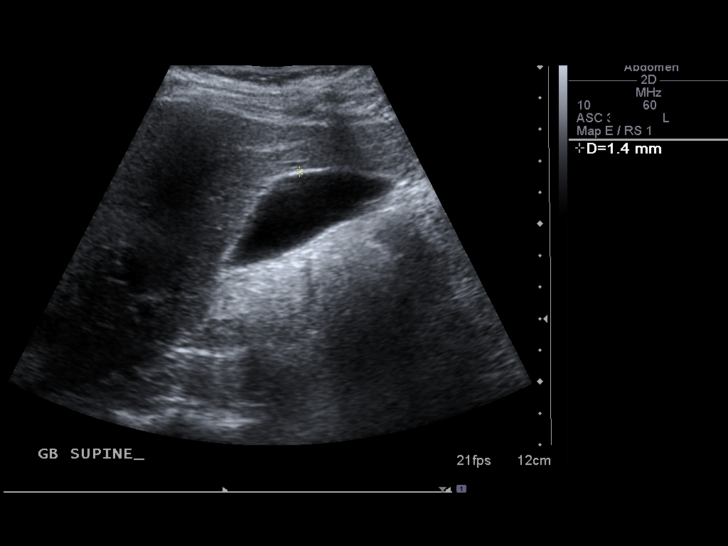
[im 36/66]
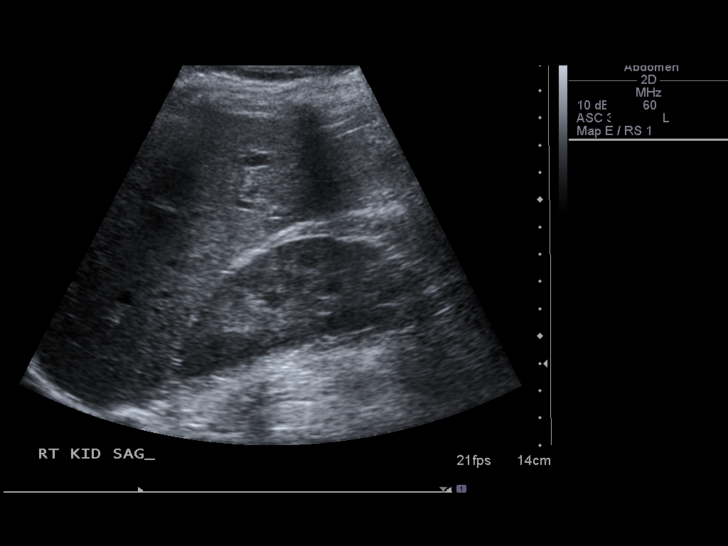
[im 41/66]
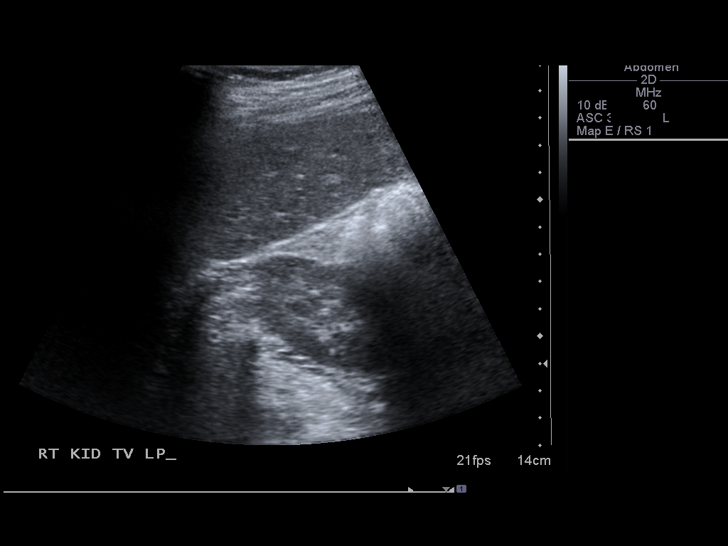
[im 44/66]
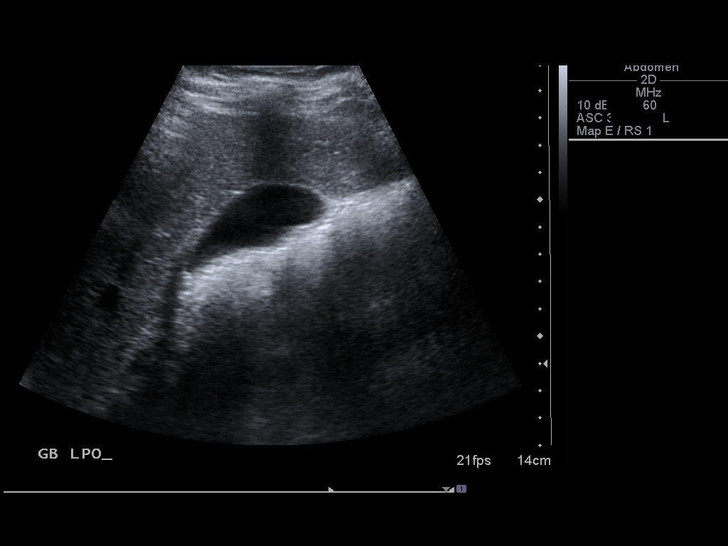
[im 49/66]
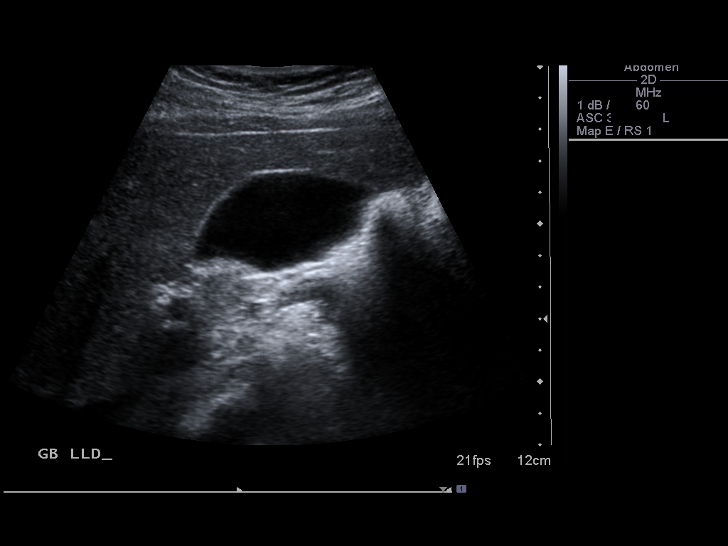
[im 55/66]
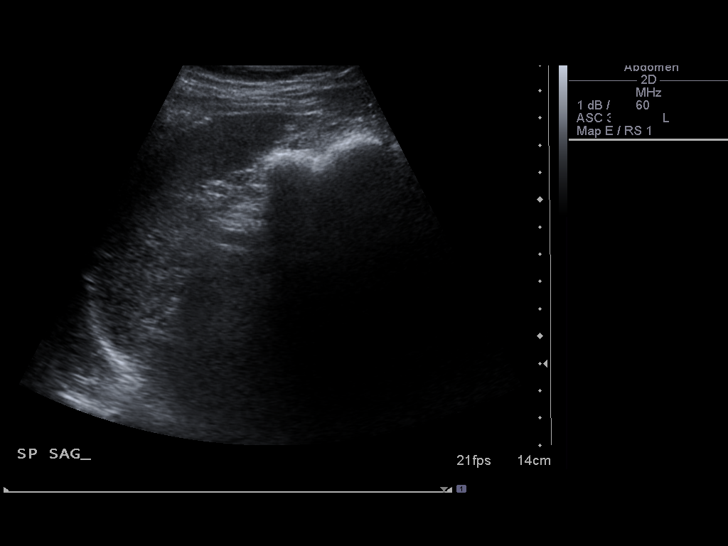
[im 60/66]
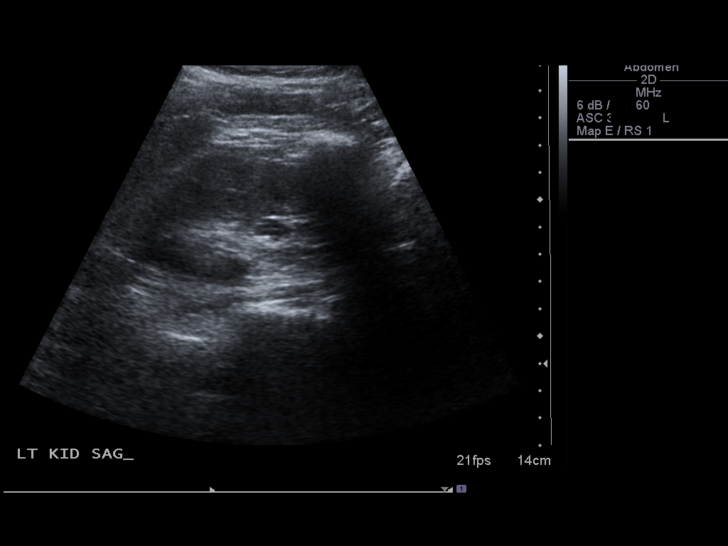
[im 66/66]
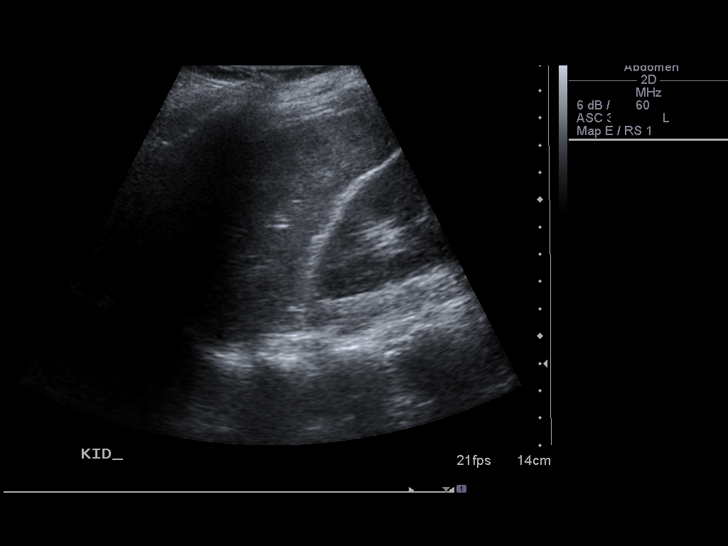

[14 of 25 positions shown; findings below may reference images not displayed]

FINDINGS: Gallbladder:  No gallstones, gallbladder wall thickening, or
pericholecystic fluid. There is no sonographic Murphy's sign.

Common bile duct:  2.7 mm in diameter within normal limits.

Liver:  No focal lesion identified.  Within normal limits in
parenchymal echogenicity.

IVC:  Appears normal.

Pancreas:  No focal abnormality seen.

Spleen:  Measures 5.6 cm in length.  Normal echogenicity.

Right Kidney:  Measures 10.6 cm in length.  No hydronephrosis or
diagnostic renal calculus

Left Kidney:  Measures 10.5 cm in length.  No hydronephrosis or
diagnostic renal calculus

Abdominal aorta:  No aneurysm identified. Measures up to 2.2 cm in
diameter.
IMPRESSION: Negative abdominal ultrasound.

## 2011-01-11 ENCOUNTER — Other Ambulatory Visit: Payer: Self-pay | Admitting: Gynecology

## 2011-01-11 ENCOUNTER — Other Ambulatory Visit: Payer: Self-pay | Admitting: Family Medicine

## 2011-01-11 DIAGNOSIS — Z1231 Encounter for screening mammogram for malignant neoplasm of breast: Secondary | ICD-10-CM

## 2011-01-28 ENCOUNTER — Ambulatory Visit: Payer: Self-pay

## 2011-01-28 ENCOUNTER — Ambulatory Visit
Admission: RE | Admit: 2011-01-28 | Discharge: 2011-01-28 | Disposition: A | Discharge status: Home / Self Care | Payer: BC Managed Care – PPO | Source: Ambulatory Visit | Attending: Family Medicine | Admitting: Family Medicine

## 2011-01-28 DIAGNOSIS — Z1231 Encounter for screening mammogram for malignant neoplasm of breast: Secondary | ICD-10-CM

## 2011-02-09 ENCOUNTER — Other Ambulatory Visit: Payer: Self-pay | Admitting: Internal Medicine

## 2011-02-09 NOTE — Telephone Encounter (Signed)
Last OV 07/07/2009(No pending appointment), Dr.Hopper please advise

## 2011-02-10 NOTE — Telephone Encounter (Signed)
This should be Rxed by Gyn as it's a specialized therapy

## 2011-02-10 NOTE — Telephone Encounter (Signed)
Patient states the pharmacy sent to the wrong office and she will contact them

## 2014-10-07 ENCOUNTER — Encounter: Payer: Self-pay | Admitting: Sports Medicine

## 2014-10-07 ENCOUNTER — Ambulatory Visit (INDEPENDENT_AMBULATORY_CARE_PROVIDER_SITE_OTHER): Payer: BC Managed Care – PPO | Admitting: Sports Medicine

## 2014-10-07 VITALS — BP 153/90 | HR 84 | Ht 63.5 in | Wt 151.0 lb

## 2014-10-07 DIAGNOSIS — E039 Hypothyroidism, unspecified: Secondary | ICD-10-CM | POA: Diagnosis not present

## 2014-10-07 DIAGNOSIS — Z418 Encounter for other procedures for purposes other than remedying health state: Secondary | ICD-10-CM

## 2014-10-07 DIAGNOSIS — Z299 Encounter for prophylactic measures, unspecified: Secondary | ICD-10-CM | POA: Insufficient documentation

## 2014-10-07 MED ORDER — THYROID 81.25 MG PO TABS: 1.0000 | ORAL_TABLET | Freq: Two times a day (BID) | ORAL | Status: DC

## 2014-10-07 NOTE — Assessment & Plan Note (Addendum)
Healthy female, up-to-date on colonoscopy, cervical cancer screening, due for mammogram. Desires minimalistic approach. She is amenable to check some other routine blood work at the next visit. Tetanus vaccine at the next visit, some hesitance regarding shingles vaccine, I think she would be amenable to do the vaccination if we can improve that her immune system is sufficiently active with immunoglobulin levels, we did get blood work in about 6 months we can consider checking IgG, IgA, IgE, and IgM levels.

## 2014-10-07 NOTE — Assessment & Plan Note (Signed)
Continue nature-throid. At the next check we will probably do a T3, T4, and TSH.

## 2014-10-07 NOTE — Progress Notes (Signed)
  Subjective:    CC: Establish care.   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 She currently sees a complement. An alternative doctor, she tells me she did lose her trust in modern medicine. She for the most part desires a fairly minimalistic approach.  Hyperlipidemia: Does not desire treatment but is amenable to have labs checked at a future visit, she does have some blood work that will be ordered by her complementary and alternative physician.  Hypothyroidism: Currently doing some nature thyroid medication. Thinks that the TSH is not an important test for the treatment and management of hypothyroidism.  MHTFR mutation: Patient thinks that this makes her unable to eliminate any toxins. She is also worried that her immune system may be deficient.  Past medical history, Surgical history, Family history not pertinant except as noted below, Social history, Allergies, and medications have been entered into the medical record, reviewed, and no changes needed.   Review of Systems: No headache, visual changes, nausea, vomiting, diarrhea, constipation, dizziness, abdominal pain, skin rash, fevers, chills, night sweats, swollen lymph nodes, weight loss, chest pain, body aches, joint swelling, muscle aches, shortness of breath, mood changes, visual or auditory hallucinations.  Objective:    General: Well Developed, well nourished, and in no acute distress.  Neuro: Alert and oriented x3, extra-ocular muscles intact, sensation grossly intact.  HEENT: Normocephalic, atraumatic, pupils equal round reactive to light, neck supple, no masses, no lymphadenopathy, thyroid nonpalpable.  Skin: Warm and dry, no rashes noted.  Cardiac: Regular rate and rhythm, no murmurs rubs or gallops.  Respiratory: Clear to auscultation bilaterally. Not using accessory muscles, speaking in full sentences.  Abdominal: Soft, nontender, nondistended, positive bowel  sounds, no masses, no organomegaly.  Musculoskeletal: Shoulder, elbow, wrist, hip, knee, ankle stable, and with full range of motion.  Impression and Recommendations:    The patient was counselled, risk factors were discussed, anticipatory guidance given.  I spent 60 minutes with this patient, greater than 50% was face-to-face time counseling regarding the above diagnoses as well as discussing the benefits and limitations of current and recommended screening modalities.

## 2014-12-27 ENCOUNTER — Encounter: Payer: Self-pay | Admitting: Gastroenterology

## 2015-11-27 ENCOUNTER — Other Ambulatory Visit: Payer: Self-pay | Admitting: Sports Medicine

## 2019-04-25 ENCOUNTER — Encounter: Payer: Self-pay | Admitting: Gastroenterology

## 2021-01-01 ENCOUNTER — Emergency Department (HOSPITAL_COMMUNITY): Payer: Medicare PPO

## 2021-01-01 ENCOUNTER — Emergency Department (HOSPITAL_COMMUNITY)
Admission: EM | Admit: 2021-01-01 | Discharge: 2021-01-01 | Disposition: A | Discharge status: Home / Self Care | Payer: Medicare PPO | Attending: Emergency Medicine | Admitting: Emergency Medicine

## 2021-01-01 ENCOUNTER — Other Ambulatory Visit: Payer: Self-pay

## 2021-01-01 DIAGNOSIS — R002 Palpitations: Secondary | ICD-10-CM | POA: Diagnosis not present

## 2021-01-01 DIAGNOSIS — Z8616 Personal history of COVID-19: Secondary | ICD-10-CM | POA: Diagnosis not present

## 2021-01-01 DIAGNOSIS — E039 Hypothyroidism, unspecified: Secondary | ICD-10-CM | POA: Insufficient documentation

## 2021-01-01 DIAGNOSIS — E0590 Thyrotoxicosis, unspecified without thyrotoxic crisis or storm: Secondary | ICD-10-CM

## 2021-01-01 DIAGNOSIS — R000 Tachycardia, unspecified: Secondary | ICD-10-CM

## 2021-01-01 LAB — COMPREHENSIVE METABOLIC PANEL
ALT: 22 U/L (ref 0–44)
AST: 22 U/L (ref 15–41)
Albumin: 3.8 g/dL (ref 3.5–5.0)
Alkaline Phosphatase: 70 U/L (ref 38–126)
Anion gap: 9 (ref 5–15)
BUN: 17 mg/dL (ref 8–23)
CO2: 24 mmol/L (ref 22–32)
Calcium: 9.2 mg/dL (ref 8.9–10.3)
Chloride: 97 mmol/L — ABNORMAL LOW (ref 98–111)
Creatinine, Ser: 0.52 mg/dL (ref 0.44–1.00)
GFR, Estimated: >60 mL/min (ref >60)
Glucose, Bld: 106 mg/dL — ABNORMAL HIGH (ref 70–99)
Potassium: 3.5 mmol/L (ref 3.5–5.1)
Sodium: 130 mmol/L — ABNORMAL LOW (ref 135–145)
Total Bilirubin: 0.8 mg/dL (ref 0.3–1.2)
Total Protein: 6.4 g/dL — ABNORMAL LOW (ref 6.5–8.1)

## 2021-01-01 LAB — CBC WITH DIFFERENTIAL/PLATELET
Abs Immature Granulocytes: 0.01 10*3/uL (ref 0.00–0.07)
Basophils Absolute: 0 10*3/uL (ref 0.0–0.1)
Basophils Relative: 1 %
Eosinophils Absolute: 0 10*3/uL (ref 0.0–0.5)
Eosinophils Relative: 1 %
HCT: 41.5 % (ref 36.0–46.0)
Hemoglobin: 13.7 g/dL (ref 12.0–15.0)
Immature Granulocytes: 0 %
Lymphocytes Relative: 19 %
Lymphs Abs: 0.9 10*3/uL (ref 0.7–4.0)
MCH: 29.7 pg (ref 26.0–34.0)
MCHC: 33 g/dL (ref 30.0–36.0)
MCV: 89.8 fL (ref 80.0–100.0)
Monocytes Absolute: 0.4 10*3/uL (ref 0.1–1.0)
Monocytes Relative: 8 %
Neutro Abs: 3.4 10*3/uL (ref 1.7–7.7)
Neutrophils Relative %: 71 %
Platelets: 197 10*3/uL (ref 150–400)
RBC: 4.62 MIL/uL (ref 3.87–5.11)
RDW: 12.4 % (ref 11.5–15.5)
WBC: 4.7 10*3/uL (ref 4.0–10.5)
nRBC: 0 % (ref 0.0–0.2)

## 2021-01-01 LAB — T4, FREE: Free T4: 1.31 ng/dL — ABNORMAL HIGH (ref 0.61–1.12)

## 2021-01-01 LAB — TROPONIN I (HIGH SENSITIVITY)
Troponin I (High Sensitivity): 8 ng/L (ref <18)
Troponin I (High Sensitivity): 13 ng/L (ref <18)

## 2021-01-01 LAB — D-DIMER, QUANTITATIVE: D-Dimer, Quant: 0.32 ug/mL-FEU (ref 0.00–0.50)

## 2021-01-01 LAB — TSH: TSH: <0.01 u[IU]/mL — ABNORMAL LOW (ref 0.350–4.500)

## 2021-01-01 MED ORDER — METOPROLOL TARTRATE 5 MG/5ML IV SOLN
10.0000 mg | Freq: Once | INTRAVENOUS | Status: AC
  Administered 2021-01-01: 10 mg via INTRAVENOUS
  Filled 2021-01-01: qty 10

## 2021-01-01 MED ORDER — METOPROLOL TARTRATE 25 MG PO TABS: 25.0000 mg | ORAL_TABLET | Freq: Every day | ORAL | 0 refills | Status: DC | PRN

## 2021-01-01 MED ORDER — SODIUM CHLORIDE 0.9 % IV BOLUS
1000.0000 mL | Freq: Once | INTRAVENOUS | Status: AC
  Administered 2021-01-01: 1000 mL via INTRAVENOUS

## 2021-01-01 MED ORDER — METOPROLOL SUCCINATE ER 25 MG PO TB24: 25.0000 mg | ORAL_TABLET | Freq: Every day | ORAL | 0 refills | Status: DC | PRN

## 2021-01-01 NOTE — ED Provider Notes (Signed)
[facility] EMERGENCY DEPARTMENT Provider Note   CSN: [id number] Arrival date & time: 01/01/21  1755     History Chief Complaint  Patient presents with   Tachycardia    Morgan Reese is a 70 y.o. female history of hypothyroidism here presenting with elevated heart rate.  Patient states that she has a Fitbit and it shows that her heart rate occasionally goes up above 100 for the last 3 to 4 days.  She states that she also has some palpitations and feels shaky when that happens.  She went to urgent care today and had a EKG that showed 1 PVC and also sinus tachycardia. Patient denies any chest pain or shortness of breath.  Patient had COVID about a month ago.  Patient states that she has had both thyroidism and is on natural Synthroid.   The history is provided by the patient.      Past Medical History:  Diagnosis Date   Sinus congestion     Patient Active Problem List   Diagnosis Date Noted   Preventive measure 10/07/2014   Hypothyroidism 10/07/2014    Past Surgical History:  Procedure Laterality Date   NASAL SINUS SURGERY       OB History   No obstetric history on file.     Family History  Problem Relation Age of Onset   Depression Mother    Cancer Father    Stroke Paternal Uncle    Diabetes Paternal Grandmother     Social History   Tobacco Use   Smoking status: Never    Home Medications Prior to Admission medications   Medication Sig Start Date End Date Taking? Authorizing Provider  estradiol (ESTRACE) 0.1 MG/GM vaginal cream Place 1 Applicatorful vaginally at bedtime.    [provider]  NATURE-THROID 81.25 MG TABS TAKE 1 TABLET BY MOUTH TWICE A DAY 11/27/15   Monica Becton, MD  progesterone (PROMETRIUM) 200 MG capsule Take 200 mg by mouth daily.    [provider]  pyridOXINE (B-6) 50 MG tablet Take 50 mg by mouth daily.    [provider]  Vitamin D, Ergocalciferol, (DRISDOL) 50000 UNITS CAPS  capsule Take 50,000 Units by mouth every 7 (seven) days.    [provider]  zinc gluconate 50 MG tablet Take 50 mg by mouth daily.    [provider]    Allergies    Amoxicillin  Review of Systems   Review of Systems  Cardiovascular:  Positive for palpitations.  All other systems reviewed and are negative.  Physical Exam Updated Vital Signs BP 138/79   Pulse 98   Temp 98.1 F (36.7 C) (Oral)   Resp (!) 21   Ht 5' 3.5" (1.613 m)   Wt 68.5 kg   SpO2 99%   BMI 26.33 kg/m   Physical Exam Vitals and nursing note reviewed.  Constitutional:      Comments: Slightly shaky and anxious  HENT:     Head: Normocephalic.     Nose: Nose normal.     Mouth/Throat:     Mouth: Mucous membranes are moist.  Eyes:     Extraocular Movements: Extraocular movements intact.     Pupils: Pupils are equal, round, and reactive to light.  Cardiovascular:     Rate and Rhythm: Regular rhythm. Tachycardia present.     Pulses: Normal pulses.     Heart sounds: Normal heart sounds.  Pulmonary:     Effort: Pulmonary effort is normal.  Breath sounds: Normal breath sounds.  Abdominal:     General: Abdomen is flat.     Palpations: Abdomen is soft.  Musculoskeletal:        General: Normal range of motion.     Cervical back: Normal range of motion and neck supple.  Skin:    General: Skin is warm.     Capillary Refill: Capillary refill takes less than 2 seconds.  Neurological:     General: No focal deficit present.     Mental Status: She is alert and oriented to person, place, and time.  Psychiatric:        Mood and Affect: Mood normal.        Behavior: Behavior normal.    ED Results / Procedures / Treatments   Labs (all labs ordered are listed, but only abnormal results are displayed) Labs Reviewed  COMPREHENSIVE METABOLIC PANEL - Abnormal; Notable for the following components:      Result Value   Sodium 130 (*)    Chloride 97 (*)    Glucose, Bld 106 (*)    Total  Protein 6.4 (*)    All other components within normal limits  TSH - Abnormal; Notable for the following components:   TSH <0.010 (*)    All other components within normal limits  CBC WITH DIFFERENTIAL/PLATELET  D-DIMER, QUANTITATIVE  T4, FREE  T3  TROPONIN I (HIGH SENSITIVITY)  TROPONIN I (HIGH SENSITIVITY)    EKG EKG Interpretation  Date/Time:  Thursday January 01 2021 18:28:01 EDT Ventricular Rate:  114 PR Interval:  83 QRS Duration: 81 QT Interval:  395 QTC Calculation: 544 R Axis:   57 Text Interpretation: Sinus tachycardia Nonspecific repol abnormality, inferior leads Prolonged QT interval No previous ECGs available Confirmed by Richardean Canal [phone]) on 01/01/2021 7:21:00 PM  Radiology DG Chest 2 View  Result Date: 01/01/2021 CLINICAL DATA:  Palpitation EXAM: CHEST - 2 VIEW COMPARISON:  None. FINDINGS: The heart size and mediastinal contours are within normal limits. Aortic atherosclerosis. Both lungs are clear. The visualized skeletal structures are unremarkable. IMPRESSION: No active cardiopulmonary disease. Electronically Signed   By: Jasmine Pang M.D.   On: 01/01/2021 19:34    Procedures Procedures   Medications Ordered in ED Medications  sodium chloride 0.9 % bolus 1,000 mL (0 mLs Intravenous Stopped 01/01/21 2043)  metoprolol tartrate (LOPRESSOR) injection 10 mg (10 mg Intravenous Given 01/01/21 2105)    ED Course  I have reviewed the triage vital signs and the nursing notes.  Pertinent labs & imaging results that were available during my care of the patient were reviewed by me and considered in my medical decision making (see chart for details).    MDM Rules/Calculators/A&P                          Morgan Reese is a 70 y.o. female here with palpitations.  Patient is natural synthroid.  Consider hyperthyroidism versus electrolyte abnormalities versus PE.  Plan to get CBC and CMP and TSH and D-dimer and troponin.  Will hydrate and reassess   9:37  PM Heart rate down to the 90s after Lopressor.  Patient's TSH is low.  T3 and T4 are pending.  At this point, I would recommend decreasing dose of her thyroid hormone.  She will see a new primary care doctor on Monday.  T3 and T4 should be back by then.  We will also give metoprolol as needed for palpitations  Final Clinical Impression(s) / ED Diagnoses Final diagnoses:  None    Rx / DC Orders ED Discharge Orders     None        Charlynne Pander, MD 01/01/21 2137

## 2021-01-01 NOTE — ED Notes (Signed)
Pt ambulatory to restroom

## 2021-01-01 NOTE — ED Triage Notes (Signed)
Pt BIB EMS due to tachycardia. Pt was at PCP and did an EKG and read it was sinus tachycardia. This is a new onset for pt and pt is having a new onset of tremors. EKG showed multiple PVC's as well. Pt is axox4.

## 2021-01-01 NOTE — ED Notes (Signed)
Pt transported to Xray. 

## 2021-01-01 NOTE — Discharge Instructions (Addendum)
Please decrease the dose of your NP thyroid (1 grain in AM and 1 grain in PM)  Take metoprolol daily as needed if your heart rate is consistently greater than 100  See your primary care doctor Monday as scheduled and check your MyChart for your T4 and T3 results  Return to ER if you have persistent palpitations, chest pain, trouble breathing

## 2021-01-01 NOTE — ED Notes (Signed)
Dr. Yao at bedside. 

## 2021-01-01 NOTE — ED Notes (Signed)
Patient verbalizes understanding of discharge instructions. Opportunity for questioning and answers were provided. Armband removed by staff, pt discharged from ED ambulatory.   

## 2021-01-01 NOTE — ED Notes (Signed)
Pt reports having a high HR ever since having covid in July. Pt reports she feels shaky and has a tremor when her HR is high.

## 2021-01-03 LAB — T3: T3, Total: 211 ng/dL — ABNORMAL HIGH (ref 71–180)

## 2021-03-01 ENCOUNTER — Emergency Department (HOSPITAL_BASED_OUTPATIENT_CLINIC_OR_DEPARTMENT_OTHER)
Admission: EM | Admit: 2021-03-01 | Discharge: 2021-03-01 | Disposition: A | Discharge status: Home / Self Care | Payer: Medicare PPO | Attending: Student | Admitting: Student

## 2021-03-01 ENCOUNTER — Encounter (HOSPITAL_BASED_OUTPATIENT_CLINIC_OR_DEPARTMENT_OTHER): Payer: Self-pay | Admitting: Emergency Medicine

## 2021-03-01 ENCOUNTER — Emergency Department (HOSPITAL_BASED_OUTPATIENT_CLINIC_OR_DEPARTMENT_OTHER): Payer: Medicare PPO | Admitting: Radiology

## 2021-03-01 DIAGNOSIS — Z20822 Contact with and (suspected) exposure to covid-19: Secondary | ICD-10-CM | POA: Diagnosis not present

## 2021-03-01 DIAGNOSIS — E039 Hypothyroidism, unspecified: Secondary | ICD-10-CM | POA: Insufficient documentation

## 2021-03-01 DIAGNOSIS — Z79899 Other long term (current) drug therapy: Secondary | ICD-10-CM | POA: Insufficient documentation

## 2021-03-01 DIAGNOSIS — R000 Tachycardia, unspecified: Secondary | ICD-10-CM | POA: Diagnosis not present

## 2021-03-01 DIAGNOSIS — I10 Essential (primary) hypertension: Secondary | ICD-10-CM | POA: Diagnosis not present

## 2021-03-01 DIAGNOSIS — F419 Anxiety disorder, unspecified: Secondary | ICD-10-CM | POA: Insufficient documentation

## 2021-03-01 DIAGNOSIS — R457 State of emotional shock and stress, unspecified: Secondary | ICD-10-CM | POA: Diagnosis not present

## 2021-03-01 DIAGNOSIS — R251 Tremor, unspecified: Secondary | ICD-10-CM | POA: Insufficient documentation

## 2021-03-01 DIAGNOSIS — R0602 Shortness of breath: Secondary | ICD-10-CM | POA: Insufficient documentation

## 2021-03-01 HISTORY — DX: Essential (primary) hypertension: I10

## 2021-03-01 HISTORY — DX: Disorder of thyroid, unspecified: E07.9

## 2021-03-01 LAB — CBC WITH DIFFERENTIAL/PLATELET
Abs Immature Granulocytes: 0.04 10*3/uL (ref 0.00–0.07)
Basophils Absolute: 0 10*3/uL (ref 0.0–0.1)
Basophils Relative: 1 %
Eosinophils Absolute: 0 10*3/uL (ref 0.0–0.5)
Eosinophils Relative: 0 %
HCT: 40.8 % (ref 36.0–46.0)
Hemoglobin: 14.2 g/dL (ref 12.0–15.0)
Immature Granulocytes: 1 %
Lymphocytes Relative: 11 %
Lymphs Abs: 0.9 10*3/uL (ref 0.7–4.0)
MCH: 30.1 pg (ref 26.0–34.0)
MCHC: 34.8 g/dL (ref 30.0–36.0)
MCV: 86.6 fL (ref 80.0–100.0)
Monocytes Absolute: 0.5 10*3/uL (ref 0.1–1.0)
Monocytes Relative: 7 %
Neutro Abs: 6.3 10*3/uL (ref 1.7–7.7)
Neutrophils Relative %: 80 %
Platelets: 222 10*3/uL (ref 150–400)
RBC: 4.71 MIL/uL (ref 3.87–5.11)
RDW: 13.2 % (ref 11.5–15.5)
WBC: 7.8 10*3/uL (ref 4.0–10.5)
nRBC: 0 % (ref 0.0–0.2)

## 2021-03-01 LAB — COMPREHENSIVE METABOLIC PANEL
ALT: 18 U/L (ref 0–44)
AST: 20 U/L (ref 15–41)
Albumin: 4.4 g/dL (ref 3.5–5.0)
Alkaline Phosphatase: 55 U/L (ref 38–126)
Anion gap: 10 (ref 5–15)
BUN: 10 mg/dL (ref 8–23)
CO2: 23 mmol/L (ref 22–32)
Calcium: 8.8 mg/dL — ABNORMAL LOW (ref 8.9–10.3)
Chloride: 95 mmol/L — ABNORMAL LOW (ref 98–111)
Creatinine, Ser: 0.48 mg/dL (ref 0.44–1.00)
GFR, Estimated: >60 mL/min (ref >60)
Glucose, Bld: 119 mg/dL — ABNORMAL HIGH (ref 70–99)
Potassium: 3.6 mmol/L (ref 3.5–5.1)
Sodium: 128 mmol/L — ABNORMAL LOW (ref 135–145)
Total Bilirubin: 0.8 mg/dL (ref 0.3–1.2)
Total Protein: 6.9 g/dL (ref 6.5–8.1)

## 2021-03-01 LAB — RESP PANEL BY RT-PCR (FLU A&B, COVID) ARPGX2
Influenza A by PCR: NEGATIVE
Influenza B by PCR: NEGATIVE
SARS Coronavirus 2 by RT PCR: NEGATIVE

## 2021-03-01 LAB — TROPONIN I (HIGH SENSITIVITY)
Troponin I (High Sensitivity): 4 ng/L (ref <18)
Troponin I (High Sensitivity): 5 ng/L (ref <18)

## 2021-03-01 LAB — TSH: TSH: 1.064 u[IU]/mL (ref 0.350–4.500)

## 2021-03-01 LAB — BRAIN NATRIURETIC PEPTIDE: B Natriuretic Peptide: 262.8 pg/mL — ABNORMAL HIGH (ref 0.0–100.0)

## 2021-03-01 LAB — T4, FREE: Free T4: 1.31 ng/dL — ABNORMAL HIGH (ref 0.61–1.12)

## 2021-03-01 MED ORDER — HYDROXYZINE HCL 25 MG PO TABS: 25.0000 mg | ORAL_TABLET | Freq: Four times a day (QID) | ORAL | 0 refills | Status: DC | PRN

## 2021-03-01 MED ORDER — SODIUM CHLORIDE 0.9 % IV BOLUS: 1000.0000 mL | Freq: Once | INTRAVENOUS | Status: DC

## 2021-03-01 NOTE — ED Triage Notes (Signed)
Pt reports having her thyroid medications adjusted recently and states that she "feels off" since, with more noticeable tremors and anxiety.  Pt also reports SOB with walking upstairs last night and continuing this morning, which prompted her to come to ED today.

## 2021-03-01 NOTE — ED Provider Notes (Signed)
[facility] EMERGENCY DEPT Provider Note   CSN: [id number] Arrival date & time: 03/01/21  1149     History Chief Complaint  Patient presents with   Shortness of Breath    Morgan Reese is a 70 y.o. female.  The history is provided by the patient and medical records. No language interpreter was used.  Shortness of Breath  70 year old-year-old female significant history of hypothyroidism currently on Nature-throid presenting today with complaint tremors anxiety.  Patient states she has a prolonged history of hypothyroidism however in August of this year she was In the hospital after she was diagnosed with elevated thyroid.  She had her medication switched over to Synthroid.  Since then she states she has not been feeling herself.  She endorsed occasional tremors and bouts of anxiety however within the past week she noticed increased trouble with shortness of breath, feeling anxious, having tremors, dry mouth, irritation in the neck and overall not her normal self.  She feels that her blood pressure is higher than usual despite taking metoprolol.  She gets out of breath even with short exertion.  She is concerned that her thyroid level is abnormal thus prompting this ER visit.  She has not had prior thyroidectomy.  She mention her symptoms started after she developed COVID infection in July.  Past Medical History:  Diagnosis Date   Hypertension    Sinus congestion    Thyroid disease     Patient Active Problem List   Diagnosis Date Noted   Preventive measure 10/07/2014   Hypothyroidism 10/07/2014    Past Surgical History:  Procedure Laterality Date   NASAL SINUS SURGERY     TUBAL LIGATION       OB History   No obstetric history on file.     Family History  Problem Relation Age of Onset   Depression Mother    Cancer Father    Stroke Paternal Uncle    Diabetes Paternal Grandmother     Social History   Tobacco Use   Smoking status: Never   Smokeless  tobacco: Never  Vaping Use   Vaping Use: Never used  Substance Use Topics   Alcohol use: Never   Drug use: Never    Home Medications Prior to Admission medications   Medication Sig Start Date End Date Taking? Authorizing Provider  estradiol (ESTRACE) 0.1 MG/GM vaginal cream Place 1 Applicatorful vaginally at bedtime.    [provider]  metoprolol tartrate (LOPRESSOR) 25 MG tablet Take 1 tablet (25 mg total) by mouth daily as needed. 01/01/21   Charlynne Pander, MD  NATURE-THROID 81.25 MG TABS TAKE 1 TABLET BY MOUTH TWICE A DAY 11/27/15   Monica Becton, MD  progesterone (PROMETRIUM) 200 MG capsule Take 200 mg by mouth daily.    [provider]  pyridOXINE (B-6) 50 MG tablet Take 50 mg by mouth daily.    [provider]  Vitamin D, Ergocalciferol, (DRISDOL) 50000 UNITS CAPS capsule Take 50,000 Units by mouth every 7 (seven) days.    [provider]  zinc gluconate 50 MG tablet Take 50 mg by mouth daily.    [provider]    Allergies    Amoxicillin  Review of Systems   Review of Systems  Respiratory:  Positive for shortness of breath.   All other systems reviewed and are negative.  Physical Exam Updated Vital Signs BP (!) 150/83 (BP Location: Right Arm)   Pulse 89   Temp 98.4 F (36.9 C) (  Temporal)   Resp 18   Ht 5\' 3"  (1.6 m)   Wt 61.2 kg   SpO2 98%   BMI 23.91 kg/m   Physical Exam Vitals and nursing note reviewed.  Constitutional:      General: She is not in acute distress.    Appearance: She is well-developed.     Comments: Appears anxious, has slight tremors to both hands  HENT:     Head: Atraumatic.  Eyes:     Conjunctiva/sclera: Conjunctivae normal.     Comments: No exophthalmos  Cardiovascular:     Rate and Rhythm: Normal rate and regular rhythm.  Pulmonary:     Effort: Pulmonary effort is normal. No accessory muscle usage.     Breath sounds: No decreased breath sounds, wheezing, rhonchi or rales.   Chest:     Chest wall: No tenderness.  Abdominal:     Palpations: Abdomen is soft.     Tenderness: There is no abdominal tenderness.  Musculoskeletal:     Cervical back: Neck supple.     Right lower leg: No edema.     Left lower leg: No edema.  Skin:    Findings: No rash.  Neurological:     Mental Status: She is alert and oriented to person, place, and time.  Psychiatric:        Mood and Affect: Mood normal.    ED Results / Procedures / Treatments   Labs (all labs ordered are listed, but only abnormal results are displayed) Labs Reviewed  COMPREHENSIVE METABOLIC PANEL - Abnormal; Notable for the following components:      Result Value   Sodium 128 (*)    Chloride 95 (*)    Glucose, Bld 119 (*)    Calcium 8.8 (*)    All other components within normal limits  BRAIN NATRIURETIC PEPTIDE - Abnormal; Notable for the following components:   B Natriuretic Peptide 262.8 (*)    All other components within normal limits  T4, FREE - Abnormal; Notable for the following components:   Free T4 1.31 (*)    All other components within normal limits  RESP PANEL BY RT-PCR (FLU A&B, COVID) ARPGX2  CBC WITH DIFFERENTIAL/PLATELET  TSH  T3, FREE  TROPONIN I (HIGH SENSITIVITY)  TROPONIN I (HIGH SENSITIVITY)    EKG EKG Interpretation  Date/Time:  Sunday March 01 2021 12:44:05 EDT Ventricular Rate:  87 PR Interval:  102 QRS Duration: 84 QT Interval:  328 QTC Calculation: 394 R Axis:   62 Text Interpretation: Sinus rhythm with short PR Nonspecific T wave abnormality Abnormal ECG Since last tracing rate slower Confirmed by 02-07-1997 [phone]) on 03/01/2021 5:18:19 PM ED ECG REPORT   Date: 03/01/2021  Rate: 87  Rhythm: normal sinus rhythm  QRS Axis: normal  Intervals: normal  ST/T Wave abnormalities: nonspecific T wave changes  Conduction Disutrbances:none  Narrative Interpretation:   Old EKG Reviewed: unchanged  I have personally reviewed the EKG tracing and agree with the  computerized printout as noted.   Radiology DG Chest 2 View  Result Date: 03/01/2021 CLINICAL DATA:  Shortness of breath. EXAM: CHEST - 2 VIEW COMPARISON:  January 01, 2021 FINDINGS: The heart size and mediastinal contours are within normal limits. Both lungs are clear. The visualized skeletal structures are unremarkable. IMPRESSION: No active cardiopulmonary disease. Electronically Signed   By: January 03, 2021 M.D.   On: 03/01/2021 13:33    Procedures Procedures   Medications Ordered in ED Medications - No data to display  ED Course  I have reviewed the triage vital signs and the nursing notes.  Pertinent labs & imaging results that were available during my care of the patient were reviewed by me and considered in my medical decision making (see chart for details).    MDM Rules/Calculators/A&P                         BP (!) 154/80   Pulse 82   Temp 98.4 F (36.9 C) (Temporal)   Resp 18   Ht 5\' 3"  (1.6 m)   Wt 61.2 kg   SpO2 100%   BMI 23.91 kg/m    BP 112/73   Pulse 96   Temp 98.4 F (36.9 C) (Temporal)   Resp 16   Ht 5\' 3"  (1.6 m)   Wt 61.2 kg   SpO2 98%   BMI 23.91 kg/m   Final Clinical Impression(s) / ED Diagnoses Final diagnoses:  Anxiety  Occasional tremors    Rx / DC Orders ED Discharge Orders          Ordered    hydrOXYzine (ATARAX/VISTARIL) 25 MG tablet  Every 6 hours PRN        03/01/21 2125           1:15 PM Patient with history of hypothyroidism who was taking natural Synthroid however she developed heart palpitation and increased tremors back in August of this year.  She was diagnosed with hypothyroidism, and she came off from taking her Synthroid and switch over to levothyroxine as well as a beta-blocker.  Since then she have not been feeling herself and reported increased anxieties, tremors, shortness of breath and elevated blood pressure.  Symptoms concerning for hyperthyroidism.  Low suspicion for PE or ACS.  Work-up initiated.  4:41  PM Labs shows a sodium of 128.  It was 130 approximately month ago.  We will give IV fluid.  Patient does have an elevated BNP of 262 have a chest x-ray did not shows any signs of pleural effusion and EKG without evidence of A. fib. 9:28 PM Fortunately TSH is within normal limit.  Elevated free T3-4 were noted but unchanged since August.  Chest x-ray unremarkable.  Vital signs stable.  At this time encouraged patient to follow-up closely with her provider for further management of her condition.  I have prescribed hydroxyzine as needed for anxiety.  Return precaution given   September, September 03/01/21 2130    05/01/21, MD 03/02/21 [phone]

## 2021-03-01 NOTE — ED Notes (Signed)
Patient transported to X-ray 

## 2021-03-01 NOTE — ED Notes (Signed)
Pt verbalizes understanding of discharge instructions. Opportunity for questioning and answers were provided. Armand removed by staff, pt discharged from ED to home. Educated to pick up RX 

## 2021-03-01 NOTE — Discharge Instructions (Addendum)
You have been evaluated for your symptoms.  Fortunately your TSH is within normal limit.  Please continue taking medication as prescribed.  You may take Vistaril as needed for anxiety.  Follow-up closely with your primary care doctor for further management.  You may check your lab results through MyChart, link below.

## 2021-03-02 LAB — T3, FREE: T3, Free: 2 pg/mL (ref 2.0–4.4)

## 2021-03-10 ENCOUNTER — Encounter (HOSPITAL_BASED_OUTPATIENT_CLINIC_OR_DEPARTMENT_OTHER): Payer: Self-pay

## 2021-03-10 ENCOUNTER — Other Ambulatory Visit: Payer: Self-pay

## 2021-03-10 ENCOUNTER — Inpatient Hospital Stay (HOSPITAL_BASED_OUTPATIENT_CLINIC_OR_DEPARTMENT_OTHER)
Admission: EM | Admit: 2021-03-10 | Discharge: 2021-03-12 | DRG: 645 | Disposition: A | Discharge status: Home / Self Care | Payer: Medicare Other | Attending: Internal Medicine | Admitting: Internal Medicine

## 2021-03-10 ENCOUNTER — Ambulatory Visit: Payer: BC Managed Care – PPO | Admitting: Internal Medicine

## 2021-03-10 DIAGNOSIS — Z8616 Personal history of COVID-19: Secondary | ICD-10-CM | POA: Diagnosis not present

## 2021-03-10 DIAGNOSIS — Z809 Family history of malignant neoplasm, unspecified: Secondary | ICD-10-CM

## 2021-03-10 DIAGNOSIS — Z818 Family history of other mental and behavioral disorders: Secondary | ICD-10-CM

## 2021-03-10 DIAGNOSIS — R0789 Other chest pain: Secondary | ICD-10-CM | POA: Diagnosis not present

## 2021-03-10 DIAGNOSIS — Z79890 Hormone replacement therapy (postmenopausal): Secondary | ICD-10-CM | POA: Diagnosis not present

## 2021-03-10 DIAGNOSIS — F410 Panic disorder [episodic paroxysmal anxiety] without agoraphobia: Secondary | ICD-10-CM | POA: Diagnosis not present

## 2021-03-10 DIAGNOSIS — Z833 Family history of diabetes mellitus: Secondary | ICD-10-CM

## 2021-03-10 DIAGNOSIS — I10 Essential (primary) hypertension: Secondary | ICD-10-CM | POA: Diagnosis present

## 2021-03-10 DIAGNOSIS — E222 Syndrome of inappropriate secretion of antidiuretic hormone: Principal | ICD-10-CM | POA: Diagnosis present

## 2021-03-10 DIAGNOSIS — E039 Hypothyroidism, unspecified: Secondary | ICD-10-CM | POA: Diagnosis not present

## 2021-03-10 DIAGNOSIS — Z823 Family history of stroke: Secondary | ICD-10-CM

## 2021-03-10 DIAGNOSIS — E871 Hypo-osmolality and hyponatremia: Secondary | ICD-10-CM | POA: Diagnosis not present

## 2021-03-10 DIAGNOSIS — Z79899 Other long term (current) drug therapy: Secondary | ICD-10-CM

## 2021-03-10 DIAGNOSIS — Z20822 Contact with and (suspected) exposure to covid-19: Secondary | ICD-10-CM | POA: Diagnosis present

## 2021-03-10 LAB — RENAL FUNCTION PANEL
Albumin: 3.8 g/dL (ref 3.5–5.0)
Albumin: 3.6 g/dL (ref 3.5–5.0)
Anion gap: 8 (ref 5–15)
Anion gap: 9 (ref 5–15)
BUN: 9 mg/dL (ref 8–23)
BUN: 12 mg/dL (ref 8–23)
CO2: 22 mmol/L (ref 22–32)
CO2: 19 mmol/L — ABNORMAL LOW (ref 22–32)
Calcium: 8.9 mg/dL (ref 8.9–10.3)
Calcium: 8.7 mg/dL — ABNORMAL LOW (ref 8.9–10.3)
Chloride: 92 mmol/L — ABNORMAL LOW (ref 98–111)
Chloride: 99 mmol/L (ref 98–111)
Creatinine, Ser: 0.43 mg/dL — ABNORMAL LOW (ref 0.44–1.00)
Creatinine, Ser: 0.46 mg/dL (ref 0.44–1.00)
GFR, Estimated: >60 mL/min (ref >60)
GFR, Estimated: >60 mL/min (ref >60)
Glucose, Bld: 107 mg/dL — ABNORMAL HIGH (ref 70–99)
Glucose, Bld: 94 mg/dL (ref 70–99)
Phosphorus: 3.1 mg/dL (ref 2.5–4.6)
Phosphorus: 3.8 mg/dL (ref 2.5–4.6)
Potassium: 3.6 mmol/L (ref 3.5–5.1)
Potassium: 3.9 mmol/L (ref 3.5–5.1)
Sodium: 122 mmol/L — ABNORMAL LOW (ref 135–145)
Sodium: 127 mmol/L — ABNORMAL LOW (ref 135–145)

## 2021-03-10 LAB — T4, FREE: Free T4: 1.34 ng/dL — ABNORMAL HIGH (ref 0.61–1.12)

## 2021-03-10 LAB — URINALYSIS, ROUTINE W REFLEX MICROSCOPIC
Bilirubin Urine: NEGATIVE
Glucose, UA: NEGATIVE mg/dL
Hgb urine dipstick: NEGATIVE
Ketones, ur: 40 mg/dL — AB
Leukocytes,Ua: NEGATIVE
Nitrite: NEGATIVE
Protein, ur: NEGATIVE mg/dL
Specific Gravity, Urine: 1.012 (ref 1.005–1.030)
pH: 7.5 (ref 5.0–8.0)

## 2021-03-10 LAB — CBC
HCT: 37.3 % (ref 36.0–46.0)
Hemoglobin: 13.3 g/dL (ref 12.0–15.0)
MCH: 30.4 pg (ref 26.0–34.0)
MCHC: 35.7 g/dL (ref 30.0–36.0)
MCV: 85.4 fL (ref 80.0–100.0)
Platelets: 221 10*3/uL (ref 150–400)
RBC: 4.37 MIL/uL (ref 3.87–5.11)
RDW: 12.7 % (ref 11.5–15.5)
WBC: 7.8 10*3/uL (ref 4.0–10.5)
nRBC: 0 % (ref 0.0–0.2)

## 2021-03-10 LAB — COMPREHENSIVE METABOLIC PANEL
ALT: 18 U/L (ref 0–44)
AST: 23 U/L (ref 15–41)
Albumin: 4 g/dL (ref 3.5–5.0)
Alkaline Phosphatase: 56 U/L (ref 38–126)
Anion gap: 10 (ref 5–15)
BUN: 8 mg/dL (ref 8–23)
CO2: 22 mmol/L (ref 22–32)
Calcium: 8.7 mg/dL — ABNORMAL LOW (ref 8.9–10.3)
Chloride: 87 mmol/L — ABNORMAL LOW (ref 98–111)
Creatinine, Ser: 0.31 mg/dL — ABNORMAL LOW (ref 0.44–1.00)
GFR, Estimated: >60 mL/min (ref >60)
Glucose, Bld: 116 mg/dL — ABNORMAL HIGH (ref 70–99)
Potassium: 3.5 mmol/L (ref 3.5–5.1)
Sodium: 119 mmol/L — CL (ref 135–145)
Total Bilirubin: 1 mg/dL (ref 0.3–1.2)
Total Protein: 6.2 g/dL — ABNORMAL LOW (ref 6.5–8.1)

## 2021-03-10 LAB — NA AND K (SODIUM & POTASSIUM), RAND UR
Potassium Urine: 51 mmol/L
Sodium, Ur: 116 mmol/L

## 2021-03-10 LAB — BASIC METABOLIC PANEL
Anion gap: 8 (ref 5–15)
BUN: 8 mg/dL (ref 8–23)
CO2: 23 mmol/L (ref 22–32)
Calcium: 8.5 mg/dL — ABNORMAL LOW (ref 8.9–10.3)
Chloride: 87 mmol/L — ABNORMAL LOW (ref 98–111)
Creatinine, Ser: 0.31 mg/dL — ABNORMAL LOW (ref 0.44–1.00)
GFR, Estimated: >60 mL/min (ref >60)
Glucose, Bld: 111 mg/dL — ABNORMAL HIGH (ref 70–99)
Potassium: 3.6 mmol/L (ref 3.5–5.1)
Sodium: 118 mmol/L — CL (ref 135–145)

## 2021-03-10 LAB — OSMOLALITY, URINE: Osmolality, Ur: 472 mOsm/kg (ref 300–900)

## 2021-03-10 LAB — RESP PANEL BY RT-PCR (FLU A&B, COVID) ARPGX2
Influenza A by PCR: NEGATIVE
Influenza B by PCR: NEGATIVE
SARS Coronavirus 2 by RT PCR: NEGATIVE

## 2021-03-10 LAB — OSMOLALITY: Osmolality: 244 mOsm/kg — CL (ref 275–295)

## 2021-03-10 LAB — TSH: TSH: 1.071 u[IU]/mL (ref 0.350–4.500)

## 2021-03-10 LAB — TROPONIN I (HIGH SENSITIVITY): Troponin I (High Sensitivity): 6 ng/L (ref <18)

## 2021-03-10 LAB — MAGNESIUM: Magnesium: 1.7 mg/dL (ref 1.7–2.4)

## 2021-03-10 MED ORDER — COSYNTROPIN 0.25 MG IJ SOLR
0.2500 mg | Freq: Once | INTRAMUSCULAR | Status: AC
Start: 2021-03-11 — End: 2021-03-11
  Administered 2021-03-11: 0.25 mg via INTRAVENOUS
  Filled 2021-03-10: qty 0.25

## 2021-03-10 MED ORDER — ZINC GLUCONATE 50 MG PO TABS: 50.0000 mg | ORAL_TABLET | Freq: Every day | ORAL | Status: DC

## 2021-03-10 MED ORDER — SODIUM CHLORIDE 1 G PO TABS
2.0000 g | ORAL_TABLET | Freq: Three times a day (TID) | ORAL | Status: DC
  Administered 2021-03-10 – 2021-03-11 (×3): 2 g via ORAL
  Filled 2021-03-10 (×3): qty 2

## 2021-03-10 MED ORDER — LEVOTHYROXINE SODIUM 50 MCG PO TABS
50.0000 ug | ORAL_TABLET | Freq: Every day | ORAL | Status: DC
  Administered 2021-03-11 – 2021-03-12 (×2): 50 ug via ORAL
  Filled 2021-03-10 (×2): qty 1

## 2021-03-10 MED ORDER — VITAMIN B-6 50 MG PO TABS
50.0000 mg | ORAL_TABLET | Freq: Every day | ORAL | Status: DC
  Administered 2021-03-10 – 2021-03-11 (×2): 50 mg via ORAL
  Filled 2021-03-10 (×3): qty 1

## 2021-03-10 MED ORDER — HYDROXYZINE HCL 25 MG PO TABS: 25.0000 mg | ORAL_TABLET | Freq: Four times a day (QID) | ORAL | Status: DC | PRN

## 2021-03-10 MED ORDER — ACETAMINOPHEN 325 MG PO TABS: 650.0000 mg | ORAL_TABLET | Freq: Four times a day (QID) | ORAL | Status: DC | PRN

## 2021-03-10 MED ORDER — LORAZEPAM 1 MG PO TABS
1.0000 mg | ORAL_TABLET | Freq: Once | ORAL | Status: AC
  Administered 2021-03-10: 1 mg via ORAL
  Filled 2021-03-10: qty 1

## 2021-03-10 MED ORDER — ACETAMINOPHEN 650 MG RE SUPP: 650.0000 mg | Freq: Four times a day (QID) | RECTAL | Status: DC | PRN

## 2021-03-10 MED ORDER — SODIUM CHLORIDE 0.9 % IV SOLN: Freq: Once | INTRAVENOUS | Status: AC

## 2021-03-10 MED ORDER — FUROSEMIDE 20 MG PO TABS
10.0000 mg | ORAL_TABLET | Freq: Two times a day (BID) | ORAL | Status: DC
  Administered 2021-03-10 – 2021-03-11 (×2): 10 mg via ORAL
  Filled 2021-03-10 (×2): qty 1

## 2021-03-10 MED ORDER — ESTRADIOL 0.1 MG/GM VA CREA
1.0000 | TOPICAL_CREAM | Freq: Every day | VAGINAL | Status: DC
  Filled 2021-03-10: qty 42.5

## 2021-03-10 MED ORDER — PROGESTERONE MICRONIZED 100 MG PO CAPS
200.0000 mg | ORAL_CAPSULE | Freq: Every day | ORAL | Status: DC
  Administered 2021-03-11: 200 mg via ORAL
  Filled 2021-03-10 (×2): qty 2

## 2021-03-10 MED ORDER — DILTIAZEM HCL ER BEADS 120 MG PO CP24
120.0000 mg | ORAL_CAPSULE | Freq: Every day | ORAL | Status: DC
Start: 2021-03-10 — End: 2021-03-11
  Administered 2021-03-10: 120 mg via ORAL
  Filled 2021-03-10 (×3): qty 1

## 2021-03-10 NOTE — ED Notes (Signed)
Urince culture sent with UA

## 2021-03-10 NOTE — ED Provider Notes (Signed)
[facility]EMERGENCY DEPT [facility] Emergency Department Provider Note MRN:  [id number]  Arrival date & time: 03/11/21     Chief Complaint   Hypertension   History of Present Illness   Morgan Reese is a 70 y.o. year-old female with a history of hypertension, thyroid disease presenting to the ED with chief complaint of hypertension.  Restless sleep throughout the night, check blood pressure and found it to be elevated, here for evaluation.  Very concerned about the blood pressure.  Also concerned about her recent thyroid issues.  Denies any headache or vision change, no chest pain or shortness of breath, had some abdominal discomfort yesterday but was relieved with bowel movement.  No numbness or weakness to the arms or legs.   Review of Systems  A complete 10 system review of systems was obtained and all systems are negative except as noted in the HPI and PMH.   Patient's Health History    Past Medical History:  Diagnosis Date   Hypertension    Sinus congestion    Thyroid disease     Past Surgical History:  Procedure Laterality Date   NASAL SINUS SURGERY     TUBAL LIGATION      Family History  Problem Relation Age of Onset   Depression Mother    Cancer Father    Stroke Paternal Uncle    Diabetes Paternal Grandmother     Social History   Socioeconomic History   Marital status: Married    Spouse name: Not on file   Number of children: Not on file   Years of education: Not on file   Highest education level: Not on file  Occupational History   Not on file  Tobacco Use   Smoking status: Never   Smokeless tobacco: Never  Vaping Use   Vaping Use: Never used  Substance and Sexual Activity   Alcohol use: Never   Drug use: Never   Sexual activity: Not on file  Other Topics Concern   Not on file  Social History Narrative   Not on file   Social Determinants of Health   Financial Resource Strain: Not on file  Food Insecurity: Not on file  Transportation  Needs: Not on file  Physical Activity: Not on file  Stress: Not on file  Social Connections: Not on file  Intimate Partner Violence: Not on file     Physical Exam   Vitals:   03/10/21 2258 03/11/21 0332  BP: 92/63 120/74  Pulse: 73 80  Resp: 18 20  Temp: 97.9 F (36.6 C) 98.5 F (36.9 C)  SpO2: 96% 96%    CONSTITUTIONAL: Well-appearing, anxious and jittery NEURO:  Alert and oriented x 3, no focal deficits EYES:  eyes equal and reactive ENT/NECK:  no LAD, no JVD CARDIO: Regular rate, well-perfused, normal S1 and S2 PULM:  CTAB no wheezing or rhonchi GI/GU:  normal bowel sounds, non-distended, non-tender MSK/SPINE:  No gross deformities, no edema SKIN:  no rash, atraumatic PSYCH:  Appropriate speech and behavior  *Additional and/or pertinent findings included in MDM below  Diagnostic and Interventional Summary    EKG Interpretation  Date/Time:  Tuesday March 10 2021 06:40:33 EDT Ventricular Rate:  92 PR Interval:  100 QRS Duration: 92 QT Interval:  337 QTC Calculation: 417 R Axis:   71 Text Interpretation: Sinus rhythm Short PR interval Nonspecific repol abnormality, lateral leads Confirmed by Kennis Carina [id number]) on 03/10/2021 6:44:59 AM       Labs Reviewed  COMPREHENSIVE METABOLIC  PANEL - Abnormal; Notable for the following components:      Result Value   Sodium 119 (*)    Chloride 87 (*)    Glucose, Bld 116 (*)    Creatinine, Ser 0.31 (*)    Calcium 8.7 (*)    Total Protein 6.2 (*)    All other components within normal limits  T4, FREE - Abnormal; Notable for the following components:   Free T4 1.34 (*)    All other components within normal limits  OSMOLALITY - Abnormal; Notable for the following components:   Osmolality 244 (*)    All other components within normal limits  URINALYSIS, ROUTINE W REFLEX MICROSCOPIC - Abnormal; Notable for the following components:   APPearance CLOUDY (*)    Ketones, ur 40 (*)    Bacteria, UA RARE (*)    All other  components within normal limits  BASIC METABOLIC PANEL - Abnormal; Notable for the following components:   Sodium 118 (*)    Chloride 87 (*)    Glucose, Bld 111 (*)    Creatinine, Ser 0.31 (*)    Calcium 8.5 (*)    All other components within normal limits  RENAL FUNCTION PANEL - Abnormal; Notable for the following components:   Sodium 122 (*)    Chloride 92 (*)    Glucose, Bld 107 (*)    Creatinine, Ser 0.43 (*)    All other components within normal limits  RENAL FUNCTION PANEL - Abnormal; Notable for the following components:   Sodium 127 (*)    CO2 19 (*)    Calcium 8.7 (*)    All other components within normal limits  RENAL FUNCTION PANEL - Abnormal; Notable for the following components:   Sodium 130 (*)    Calcium 8.7 (*)    All other components within normal limits  RESP PANEL BY RT-PCR (FLU A&B, COVID) ARPGX2  CBC  MAGNESIUM  TSH  NA AND K (SODIUM & POTASSIUM), RAND UR  OSMOLALITY, URINE  CBC  T3, FREE  ACTH STIMULATION, 3 TIME POINTS  CORTISOL-AM, BLOOD  RENAL FUNCTION PANEL  TROPONIN I (HIGH SENSITIVITY)    No orders to display    Medications  sodium chloride tablet 2 g (2 g Oral Given 03/11/21 0741)  furosemide (LASIX) tablet 10 mg (10 mg Oral Given 03/11/21 0741)  hydrOXYzine (ATARAX/VISTARIL) tablet 25 mg (has no administration in time range)  levothyroxine (SYNTHROID) tablet 50 mcg (50 mcg Oral Given 03/11/21 0545)  progesterone (PROMETRIUM) capsule 200 mg (has no administration in time range)  estradiol (ESTRACE) vaginal cream 1 Applicatorful (has no administration in time range)  pyridOXINE (VITAMIN B-6) tablet 50 mg (50 mg Oral Given 03/10/21 1720)  acetaminophen (TYLENOL) tablet 650 mg (has no administration in time range)    Or  acetaminophen (TYLENOL) suppository 650 mg (has no administration in time range)  diltiazem (CARDIZEM CD) 24 hr capsule 120 mg (has no administration in time range)  LORazepam (ATIVAN) tablet 1 mg (1 mg Oral Given 03/10/21  0631)  0.9 %  sodium chloride infusion (0 mLs Intravenous Stopped 03/10/21 1426)  cosyntropin (CORTROSYN) injection 0.25 mg (0.25 mg Intravenous Given 03/11/21 1610)     Procedures  /  Critical Care Procedures  ED Course and Medical Decision Making  I have reviewed the triage vital signs, the nursing notes, and pertinent available records from the EMR.  Listed above are laboratory and imaging tests that I personally ordered, reviewed, and interpreted and then considered in my  medical decision making (see below for details).  Patient seems extremely anxious with jitteriness, suspect a repeat visit for anxiety.  Doubt hyperthyroid state or storm especially given patient's recent T3 and T4 levels and how she was recently decreased on her levothyroxine.  Vital signs are overall reassuring, does have hypertension 170/80.  Obtaining screening EKG, providing dose of Ativan and will reassess.     Patient remains tremulous, feeling generally unwell after Ativan, blood pressure improving somewhat.  Patient did have some hyponatremia during last ED visit, will check again to ensure no worsening.  Patient also now commenting on some chest pressure that she felt earlier today, will check troponin.  Signed out to oncoming provider at shift change.  Elmer Sow. Pilar Plate, MD [facility] Emergency Medicine [facility] [email or web address]  Final Clinical Impressions(s) / ED Diagnoses     ICD-10-CM   1. Hyponatremia  E87.1       ED Discharge Orders     None        Discharge Instructions Discussed with and Provided to Patient:   Discharge Instructions   None       Sabas Sous, MD 03/11/21 [phone]

## 2021-03-10 NOTE — Progress Notes (Signed)
CRITICAL VALUE STICKER  CRITICAL VALUE: Serum Osmolarity 266  RECEIVER (on-site recipient of call): Shelda Jakes, RN  DATE & TIME NOTIFIED:  03/10/2021 at 1500  MESSENGER (representative from lab): Lab personal   MD NOTIFIED: Dr. Ronaldo Miyamoto [facility] page)  TIME OF NOTIFICATION: 03/10/2021 at 1502  RESPONSE: Continue to monitor, lab noted by MD

## 2021-03-10 NOTE — ED Notes (Signed)
Handoff report given to [facility] RN on 5E at [employer]

## 2021-03-10 NOTE — ED Notes (Signed)
[facility] at bedside 

## 2021-03-10 NOTE — Progress Notes (Signed)
Patient arrived to room 1516 via carelink. AMION page sent to admitting to page / assign attending.

## 2021-03-10 NOTE — ED Provider Notes (Signed)
Patient signed out to me at 7 AM.  Here with concern for high blood pressure.  Basic labs have been ordered.  Ativan given for some anxiety.  Recently started on antidepressant.  Supposed to follow-up with primary care doctor this morning.  Lab work is pending at time of handoff.  Sodium is 119.  This is significantly decreased from about a week or 2 ago where sodium was about 128.  Has been slowly downtrending.  She denies any heavy alcohol use.  Medication list does not seem to be likely the cause.  She states that she started to notice increased fatigue yesterday unable to get through her normal workout yesterday and had to leave early.  She has felt rundown and has had some decreased p.o. intake.  Overall lab work is otherwise unremarkable.  We will add some sodium studies including urine osmole, serum osmole, urine sodium.  Suspect that this is a hypovolemic hyponatremia.  We will start some IV maintenance fluids.  EKG is reassuring.  Will admit to medicine given significant drop in sodium now with likely symptoms.  No seizure activity.  Appears neurologically intact.  Admitted to medicine for further care.  This chart was dictated using voice recognition software.  Despite best efforts to proofread,  errors can occur which can change the documentation meaning.  .Critical Care Performed by: Virgina Norfolk, DO Authorized by: Virgina Norfolk, DO   Critical care provider statement:    Critical care time (minutes):  35   Critical care was necessary to treat or prevent imminent or life-threatening deterioration of the following conditions:  Metabolic crisis   Critical care was time spent personally by me on the following activities:  Blood draw for specimens, discussions with primary provider, development of treatment plan with patient or surrogate, obtaining history from patient or surrogate, ordering and performing treatments and interventions, ordering and review of laboratory studies, ordering and  review of radiographic studies, pulse oximetry, re-evaluation of patient's condition and review of old charts   Care discussed with: admitting provider      Virgina Norfolk, DO 03/10/21 0831

## 2021-03-10 NOTE — ED Notes (Signed)
Attempt report.  Floor RN unavailable.

## 2021-03-10 NOTE — H&P (Signed)
History and Physical    Morgan Reese SEG:[id number] DOB: 10-07-1950 DOA: 03/10/2021  PCP: Jarrett Soho, PA-C  Patient coming from: Home  Chief Complaint: panic attack  HPI: Morgan Reese is a 70 y.o. female with medical history significant of hypothyroidism, HTN. Presenting with anxiety. Per husband, she started feeling anxious and aggitated yesterday afternoon. He thought she was having a panic attack, but they thought it would pass. He noticed that her heart rate was up as well as BP. They held off as long as they could, but her anxiety became so severe by the evening she needed to come to the ED. Of note, she reports that she has been recently adjusting her thyroid medication with her PCP/endocrinologist. She denies any other aggravating or alleviating factors.    ED Course: It was noted that her Na+ was 119. She was asymptomatic. Urine studies were ordered. [facility] was called for admission.  Review of Systems: Review of systems is otherwise negative for all not mentioned in HPI.   PMHx Past Medical History:  Diagnosis Date   Hypertension    Sinus congestion    Thyroid disease     PSHx Past Surgical History:  Procedure Laterality Date   NASAL SINUS SURGERY     TUBAL LIGATION      SocHx  reports that she has never smoked. She has never used smokeless tobacco. She reports that she does not drink alcohol and does not use drugs.  Allergies  Allergen Reactions   Amoxicillin     REACTION: Head felt over-heated    FamHx Family History  Problem Relation Age of Onset   Depression Mother    Cancer Father    Stroke Paternal Uncle    Diabetes Paternal Grandmother     Prior to Admission medications   Medication Sig Start Date End Date Taking? Authorizing Provider  diltiazem (TIAZAC) 120 MG 24 hr capsule Take 120 mg by mouth daily.   Yes [provider]  estradiol (ESTRACE) 0.1 MG/GM vaginal cream Place 1 Applicatorful vaginally at bedtime.   Yes [provider]  hydrOXYzine (ATARAX/VISTARIL) 25 MG tablet Take 1 tablet (25 mg total) by mouth every 6 (six) hours as needed for anxiety. 03/01/21  Yes Jacalyn Lefevre, MD  levothyroxine (SYNTHROID) 50 MCG tablet Take 50 mcg by mouth daily before breakfast.   Yes [provider]  metoprolol tartrate (LOPRESSOR) 25 MG tablet Take 1 tablet (25 mg total) by mouth daily as needed. 01/01/21  Yes Charlynne Pander, MD  progesterone (PROMETRIUM) 200 MG capsule Take 200 mg by mouth daily.   Yes [provider]  pyridOXINE (B-6) 50 MG tablet Take 50 mg by mouth daily.   Yes [provider]  Vitamin D, Ergocalciferol, (DRISDOL) 50000 UNITS CAPS capsule Take 50,000 Units by mouth every 7 (seven) days.   Yes [provider]  zinc gluconate 50 MG tablet Take 50 mg by mouth daily.   Yes [provider]  NATURE-THROID 81.25 MG TABS TAKE 1 TABLET BY MOUTH TWICE A DAY 11/27/15   Monica Becton, MD    Physical Exam: Vitals:   03/10/21 0930 03/10/21 1011 03/10/21 1030 03/10/21 1156  BP: 138/76 131/67 117/66 138/80  Pulse: 87 94 81 87  Resp: 20 (!) 25 19 16   Temp:    98.2 F (36.8 C)  TempSrc:    Oral  SpO2: 95% 98% 96% 99%  Weight:      Height:  General: 70 y.o. female resting in bed in NAD Eyes: PERRL, normal sclera ENMT: Nares patent w/o discharge, orophaynx clear, dentition normal, ears w/o discharge/lesions/ulcers Neck: Supple, trachea midline Cardiovascular: RRR, +S1, S2, no m/g/r, equal pulses throughout Respiratory: CTABL, no w/r/r, normal WOB GI: BS+, NDNT, no masses noted, no organomegaly noted MSK: No e/c/c Neuro: A&O x 3, no focal deficits Psyc: Appropriate interaction, somewhat pressured speech, cooperative  Labs on Admission: I have personally reviewed following labs and imaging studies  CBC: Recent Labs  Lab 03/10/21 0720  WBC 7.8  HGB 13.3  HCT 37.3  MCV 85.4  PLT 221   Basic Metabolic Panel: Recent Labs  Lab  03/10/21 0720 03/10/21 0925  NA 119* 118*  K 3.5 3.6  CL 87* 87*  CO2 22 23  GLUCOSE 116* 111*  BUN 8 8  CREATININE 0.31* 0.31*  CALCIUM 8.7* 8.5*  MG 1.7  --    GFR: Estimated Creatinine Clearance: 53.4 mL/min (A) (by C-G formula based on SCr of 0.31 mg/dL (L)). Liver Function Tests: Recent Labs  Lab 03/10/21 0720  AST 23  ALT 18  ALKPHOS 56  BILITOT 1.0  PROT 6.2*  ALBUMIN 4.0   No results for input(s): LIPASE, AMYLASE in the last 168 hours. No results for input(s): AMMONIA in the last 168 hours. Coagulation Profile: No results for input(s): INR, PROTIME in the last 168 hours. Cardiac Enzymes: No results for input(s): CKTOTAL, CKMB, CKMBINDEX, TROPONINI in the last 168 hours. BNP (last 3 results) No results for input(s): PROBNP in the last 8760 hours. HbA1C: No results for input(s): HGBA1C in the last 72 hours. CBG: No results for input(s): GLUCAP in the last 168 hours. Lipid Profile: No results for input(s): CHOL, HDL, LDLCALC, TRIG, CHOLHDL, LDLDIRECT in the last 72 hours. Thyroid Function Tests: Recent Labs    03/10/21 0720  TSH 1.071  FREET4 1.34*   Anemia Panel: No results for input(s): VITAMINB12, FOLATE, FERRITIN, TIBC, IRON, RETICCTPCT in the last 72 hours. Urine analysis:    Component Value Date/Time   COLORURINE YELLOW 03/10/2021 1005   APPEARANCEUR CLOUDY (A) 03/10/2021 1005   LABSPEC 1.012 03/10/2021 1005   PHURINE 7.5 03/10/2021 1005   GLUCOSEU NEGATIVE 03/10/2021 1005   HGBUR NEGATIVE 03/10/2021 1005   HGBUR negative 03/03/2009 0856   BILIRUBINUR NEGATIVE 03/10/2021 1005   KETONESUR 40 (A) 03/10/2021 1005   PROTEINUR NEGATIVE 03/10/2021 1005   UROBILINOGEN 0.2 03/03/2009 0856   NITRITE NEGATIVE 03/10/2021 1005   LEUKOCYTESUR NEGATIVE 03/10/2021 1005    Radiological Exams on Admission: No results found.  EKG: Independently reviewed. Sinus, no st elevations  Assessment/Plan Hyponatremia     - admitted to inpt, tele     - looks  like SIADH; low serum osm, high urine osm, high urine sodium     - fluid restriction to 800cc, add Na+ tablets, add low dose lasix     - she's asymptomatic, so no hot salts or samsca for right now     - FT4 is slightly up; can check ACTH, but this looks more SIADH right now     - nephro consulted, appreciate assistance     - q6h renal function test; limit Na+ correction to 8pts/day  Hypothyroidism     - currently undergoing thyroid replacement medication adjustment with her endocrinologist; continue outpt follow up  Anxiety     - PCP has attempted to start her on SSRIs; however, she does not like they way they make her feel. She  is not currently taking any     - follow up with PCP     - will have PRN available for agitation  HTN     - resume home regimen  DVT prophylaxis: SCDs  Code Status: FULL  Family Communication: w/ husband at bedside  Consults called: None   Status is: Inpatient  Remains inpatient appropriate because: severity  Gloris Shiroma A Ernestene Coover DO [facility]  If 7PM-7AM, please contact night-coverage [email or web address]  03/10/2021, 2:34 PM

## 2021-03-10 NOTE — ED Triage Notes (Signed)
Pt. States she has been taking a new medication and she got up in the middle of the night and took her blood pressure and it was elevated. Decided to come in this morning.

## 2021-03-10 NOTE — Progress Notes (Signed)
Notified by EDP of need for admission d/t hyponatremia. [facility] accepts patient to tele bed at [facility]. EDP is to remain responsible for orders/medical decisions while patient is holding at [facility]. Upon arrival to [facility], [facility] will assume care. Nursing staff will call patient placement to notify them of patient's arrival so that the proper [facility] member may receive the patient. Thank you.

## 2021-03-10 NOTE — ED Notes (Signed)
Handoff report given to [facility] 

## 2021-03-11 DIAGNOSIS — E039 Hypothyroidism, unspecified: Secondary | ICD-10-CM | POA: Diagnosis not present

## 2021-03-11 DIAGNOSIS — F419 Anxiety disorder, unspecified: Secondary | ICD-10-CM

## 2021-03-11 DIAGNOSIS — Z818 Family history of other mental and behavioral disorders: Secondary | ICD-10-CM | POA: Diagnosis not present

## 2021-03-11 DIAGNOSIS — F410 Panic disorder [episodic paroxysmal anxiety] without agoraphobia: Secondary | ICD-10-CM | POA: Diagnosis not present

## 2021-03-11 DIAGNOSIS — Z20822 Contact with and (suspected) exposure to covid-19: Secondary | ICD-10-CM | POA: Diagnosis not present

## 2021-03-11 DIAGNOSIS — E222 Syndrome of inappropriate secretion of antidiuretic hormone: Secondary | ICD-10-CM | POA: Diagnosis not present

## 2021-03-11 DIAGNOSIS — Z823 Family history of stroke: Secondary | ICD-10-CM | POA: Diagnosis not present

## 2021-03-11 DIAGNOSIS — Z809 Family history of malignant neoplasm, unspecified: Secondary | ICD-10-CM | POA: Diagnosis not present

## 2021-03-11 DIAGNOSIS — I10 Essential (primary) hypertension: Secondary | ICD-10-CM

## 2021-03-11 DIAGNOSIS — Z8616 Personal history of COVID-19: Secondary | ICD-10-CM | POA: Diagnosis not present

## 2021-03-11 DIAGNOSIS — E871 Hypo-osmolality and hyponatremia: Secondary | ICD-10-CM | POA: Diagnosis not present

## 2021-03-11 LAB — BASIC METABOLIC PANEL
Anion gap: 7 (ref 5–15)
Anion gap: 6 (ref 5–15)
BUN: 14 mg/dL (ref 8–23)
BUN: 17 mg/dL (ref 8–23)
CO2: 24 mmol/L (ref 22–32)
CO2: 24 mmol/L (ref 22–32)
Calcium: 8.9 mg/dL (ref 8.9–10.3)
Calcium: 8.8 mg/dL — ABNORMAL LOW (ref 8.9–10.3)
Chloride: 101 mmol/L (ref 98–111)
Chloride: 101 mmol/L (ref 98–111)
Creatinine, Ser: 0.54 mg/dL (ref 0.44–1.00)
Creatinine, Ser: 0.64 mg/dL (ref 0.44–1.00)
GFR, Estimated: >60 mL/min (ref >60)
GFR, Estimated: >60 mL/min (ref >60)
Glucose, Bld: 114 mg/dL — ABNORMAL HIGH (ref 70–99)
Glucose, Bld: 136 mg/dL — ABNORMAL HIGH (ref 70–99)
Potassium: 4 mmol/L (ref 3.5–5.1)
Potassium: 3.9 mmol/L (ref 3.5–5.1)
Sodium: 132 mmol/L — ABNORMAL LOW (ref 135–145)
Sodium: 131 mmol/L — ABNORMAL LOW (ref 135–145)

## 2021-03-11 LAB — RENAL FUNCTION PANEL
Albumin: 3.5 g/dL (ref 3.5–5.0)
Albumin: 3.8 g/dL (ref 3.5–5.0)
Anion gap: 6 (ref 5–15)
Anion gap: 6 (ref 5–15)
BUN: 12 mg/dL (ref 8–23)
BUN: 13 mg/dL (ref 8–23)
CO2: 24 mmol/L (ref 22–32)
CO2: 24 mmol/L (ref 22–32)
Calcium: 8.7 mg/dL — ABNORMAL LOW (ref 8.9–10.3)
Calcium: 8.7 mg/dL — ABNORMAL LOW (ref 8.9–10.3)
Chloride: 100 mmol/L (ref 98–111)
Chloride: 100 mmol/L (ref 98–111)
Creatinine, Ser: 0.57 mg/dL (ref 0.44–1.00)
Creatinine, Ser: 0.54 mg/dL (ref 0.44–1.00)
GFR, Estimated: >60 mL/min (ref >60)
GFR, Estimated: >60 mL/min (ref >60)
Glucose, Bld: 90 mg/dL (ref 70–99)
Glucose, Bld: 95 mg/dL (ref 70–99)
Phosphorus: 3.8 mg/dL (ref 2.5–4.6)
Phosphorus: 3.8 mg/dL (ref 2.5–4.6)
Potassium: 3.7 mmol/L (ref 3.5–5.1)
Potassium: 3.7 mmol/L (ref 3.5–5.1)
Sodium: 130 mmol/L — ABNORMAL LOW (ref 135–145)
Sodium: 130 mmol/L — ABNORMAL LOW (ref 135–145)

## 2021-03-11 LAB — CBC
HCT: 38.1 % (ref 36.0–46.0)
Hemoglobin: 13.2 g/dL (ref 12.0–15.0)
MCH: 30.8 pg (ref 26.0–34.0)
MCHC: 34.6 g/dL (ref 30.0–36.0)
MCV: 89 fL (ref 80.0–100.0)
Platelets: 211 10*3/uL (ref 150–400)
RBC: 4.28 MIL/uL (ref 3.87–5.11)
RDW: 13.2 % (ref 11.5–15.5)
WBC: 4.8 10*3/uL (ref 4.0–10.5)
nRBC: 0 % (ref 0.0–0.2)

## 2021-03-11 LAB — ACTH STIMULATION, 3 TIME POINTS
Cortisol, 30 Min: 42.4 ug/dL
Cortisol, 60 Min: 52 ug/dL
Cortisol, Base: 14.8 ug/dL

## 2021-03-11 LAB — T3, FREE: T3, Free: 1.8 pg/mL — ABNORMAL LOW (ref 2.0–4.4)

## 2021-03-11 LAB — CORTISOL-AM, BLOOD: Cortisol - AM: 15.9 ug/dL (ref 6.7–22.6)

## 2021-03-11 MED ORDER — DILTIAZEM HCL ER COATED BEADS 120 MG PO CP24
120.0000 mg | ORAL_CAPSULE | Freq: Every day | ORAL | Status: DC
  Administered 2021-03-11: 120 mg via ORAL
  Filled 2021-03-11: qty 1

## 2021-03-11 NOTE — Progress Notes (Signed)
0530 cosyntropin injection rescheduled to 0730, Crystal Salmons from lab said this particular test cannot be collected and ran until after 0700 due to staffing. Will update on-coming nursing staff.

## 2021-03-11 NOTE — Progress Notes (Signed)
PROGRESS NOTE  Morgan Reese:[id number] DOB: Nov 26, 1950 DOA: 03/10/2021 PCP: Jarrett Soho, PA-C   LOS: 1 day   Brief narrative: Morgan Reese is a 70 y.o. female with medical history significant of hypothyroidism, essential HTN pointedly presented to the hospital with feeling of anxiousness and agitation.  Per her husband he thought that she was probably having a panic attack and that she might pass out.  She had elevated heart rate and blood pressure.  Primary care had been adjusting her thyroid medications as outpatient.  However in the ED patient was noted to have sodium level of 119.  Urine and serum studies were sent and patient was admitted to hospital for further evaluation..  Assessment/Plan:  Active Problems:   Hyponatremia  Significant hyponatremia. Patient states that she has been drinking a lot of water at home.  No recent use of diuretics or SSRI.  Patient had low serum osmolality high urine osmolality and high urinary sodium.  Thought to be secondary to SIADH but has improved after fluid restriction and salt tablet and low-dose Lasix.  Sodium has significantly improved today compared to yesterday.  We will discontinue Lasix and salt tablets.  We will liberalize fluid restriction to 1200 MLS per day.  Will discuss with nephrology.   Hypothyroidism TSH within normal range.  T4 slightly elevated.  PCP adjusting Synthroid as outpatient   Anxiety PCP had attempted SSRI but is currently not taking it because of side effects.   Essential hypertension    Cardizem CD at home.  We will continue.  Continue to monitor blood pressure.  DVT prophylaxis: SCDs Start: 03/10/21 1551   Code Status: Full code  Family Communication: None  Status is: Inpatient  Remains inpatient appropriate because: After significant hyponatremia requiring closer follow-up.    Consultants: Nephrology notified  Procedures: None  Anti-infectives:  None  Anti-infectives (From  admission, onward)    None      Subjective: Today, patient was seen and examined at bedside.  Patient feels a lot better today.  Denies any dizziness, lightheadedness headache, nausea or vomiting.  Objective: Vitals:   03/11/21 0332 03/11/21 1038  BP: 120/74 119/85  Pulse: 80 89  Resp: 20   Temp: 98.5 F (36.9 C)   SpO2: 96%     Intake/Output Summary (Last 24 hours) at 03/11/2021 1205 Last data filed at 03/11/2021 1004 Gross per 24 hour  Intake 727.1 ml  Output 4 ml  Net 723.1 ml   Filed Weights   03/10/21 0617  Weight: 61.2 kg   Body mass index is 23.91 kg/m.   Physical Exam: GENERAL: Patient is alert awake and oriented. Not in obvious distress. HENT: No scleral pallor or icterus. Pupils equally reactive to light. Oral mucosa is moist NECK: is supple, no gross swelling noted. CHEST: Clear to auscultation. No crackles or wheezes.  Diminished breath sounds bilaterally. CVS: S1 and S2 heard, no murmur. Regular rate and rhythm.  ABDOMEN: Soft, non-tender, bowel sounds are present. EXTREMITIES: No edema. CNS: Cranial nerves are intact. No focal motor deficits. SKIN: warm and dry without rashes.  Skin turgor okay.  Data Review: I have personally reviewed the following laboratory data and studies,  CBC: Recent Labs  Lab 03/10/21 0720 03/11/21 0316  WBC 7.8 4.8  HGB 13.3 13.2  HCT 37.3 38.1  MCV 85.4 89.0  PLT 221 211   Basic Metabolic Panel: Recent Labs  Lab 03/10/21 0720 03/10/21 0925 03/10/21 1453 03/10/21 2044 03/11/21 0316 03/11/21 0842  NA  119* 118* 122* 127* 130* 130*  K 3.5 3.6 3.6 3.9 3.7 3.7  CL 87* 87* 92* 99 100 100  CO2 22 23 22  19* 24 24  GLUCOSE 116* 111* 107* 94 90 95  BUN 8 8 9 12 12 13   CREATININE 0.31* 0.31* 0.43* 0.46 0.57 0.54  CALCIUM 8.7* 8.5* 8.9 8.7* 8.7* 8.7*  MG 1.7  --   --   --   --   --   PHOS  --   --  3.1 3.8 3.8 3.8   Liver Function Tests: Recent Labs  Lab 03/10/21 0720 03/10/21 1453 03/10/21 2044  03/11/21 0316 03/11/21 0842  AST 23  --   --   --   --   ALT 18  --   --   --   --   ALKPHOS 56  --   --   --   --   BILITOT 1.0  --   --   --   --   PROT 6.2*  --   --   --   --   ALBUMIN 4.0 3.8 3.6 3.5 3.8   No results for input(s): LIPASE, AMYLASE in the last 168 hours. No results for input(s): AMMONIA in the last 168 hours. Cardiac Enzymes: No results for input(s): CKTOTAL, CKMB, CKMBINDEX, TROPONINI in the last 168 hours. BNP (last 3 results) Recent Labs    03/01/21 1318  BNP 262.8*    ProBNP (last 3 results) No results for input(s): PROBNP in the last 8760 hours.  CBG: No results for input(s): GLUCAP in the last 168 hours. Recent Results (from the past 240 hour(s))  Resp Panel by RT-PCR (Flu A&B, Covid) Nasopharyngeal Swab     Status: None   Collection Time: 03/01/21  1:18 PM   Specimen: Nasopharyngeal Swab; Nasopharyngeal(NP) swabs in vial transport medium  Result Value Ref Range Status   SARS Coronavirus 2 by RT PCR NEGATIVE NEGATIVE Final    Comment: (NOTE) SARS-CoV-2 target nucleic acids are NOT DETECTED.  The SARS-CoV-2 RNA is generally detectable in upper respiratory specimens during the acute phase of infection. The lowest concentration of SARS-CoV-2 viral copies this assay can detect is 138 copies/mL. A negative result does not preclude SARS-Cov-2 infection and should not be used as the sole basis for treatment or other patient management decisions. A negative result may occur with  improper specimen collection/handling, submission of specimen other than nasopharyngeal swab, presence of viral mutation(s) within the areas targeted by this assay, and inadequate number of viral copies(<138 copies/mL). A negative result must be combined with clinical observations, patient history, and epidemiological information. The expected result is Negative.  Fact Sheet for Patients:  05/01/21  Fact Sheet for Healthcare Providers:   05/01/21  This test is no t yet approved or cleared by the [employer] FDA and  has been authorized for detection and/or diagnosis of SARS-CoV-2 by FDA under an Emergency Use Authorization (EUA). This EUA will remain  in effect (meaning this test can be used) for the duration of the COVID-19 declaration under Section 564(b)(1) of the Act, 21 U.S.C.section 360bbb-3(b)(1), unless the authorization is terminated  or revoked sooner.       Influenza A by PCR NEGATIVE NEGATIVE Final   Influenza B by PCR NEGATIVE NEGATIVE Final    Comment: (NOTE) The Xpert Xpress SARS-CoV-2/FLU/RSV plus assay is intended as an aid in the diagnosis of influenza from Nasopharyngeal swab specimens and should not be used as a sole basis for  treatment. Nasal washings and aspirates are unacceptable for Xpert Xpress SARS-CoV-2/FLU/RSV testing.  Fact Sheet for Patients: [employer]gerCourse.com  Fact Sheet for Healthcare Providers: SeriousBroker.it  This test is not yet approved or cleared by the [place] FDA and has been authorized for detection and/or diagnosis of SARS-CoV-2 by FDA under an Emergency Use Authorization (EUA). This EUA will remain in effect (meaning this test can be used) for the duration of the COVID-19 declaration under Section 564(b)(1) of the Act, 21 U.S.C. section 360bbb-3(b)(1), unless the authorization is terminated or revoked.  Performed at [facility], [address], [place], [place] [address]   Resp Panel by RT-PCR (Flu A&B, Covid) Nasopharyngeal Swab     Status: None   Collection Time: 03/10/21  9:25 AM   Specimen: Nasopharyngeal Swab; Nasopharyngeal(NP) swabs in vial transport medium  Result Value Ref Range Status   SARS Coronavirus 2 by RT PCR NEGATIVE NEGATIVE Final    Comment: (NOTE) SARS-CoV-2 target nucleic acids are NOT DETECTED.  The SARS-CoV-2 RNA is generally  detectable in upper respiratory specimens during the acute phase of infection. The lowest concentration of SARS-CoV-2 viral copies this assay can detect is 138 copies/mL. A negative result does not preclude SARS-Cov-2 infection and should not be used as the sole basis for treatment or other patient management decisions. A negative result may occur with  improper specimen collection/handling, submission of specimen other than nasopharyngeal swab, presence of viral mutation(s) within the areas targeted by this assay, and inadequate number of viral copies(<138 copies/mL). A negative result must be combined with clinical observations, patient history, and epidemiological information. The expected result is Negative.  Fact Sheet for Patients:  BloggerCourse.com  Fact Sheet for Healthcare Providers:  SeriousBroker.it  This test is no t yet approved or cleared by the [place] FDA and  has been authorized for detection and/or diagnosis of SARS-CoV-2 by FDA under an Emergency Use Authorization (EUA). This EUA will remain  in effect (meaning this test can be used) for the duration of the COVID-19 declaration under Section 564(b)(1) of the Act, 21 U.S.C.section 360bbb-3(b)(1), unless the authorization is terminated  or revoked sooner.       Influenza A by PCR NEGATIVE NEGATIVE Final   Influenza B by PCR NEGATIVE NEGATIVE Final    Comment: (NOTE) The Xpert Xpress SARS-CoV-2/FLU/RSV plus assay is intended as an aid in the diagnosis of influenza from Nasopharyngeal swab specimens and should not be used as a sole basis for treatment. Nasal washings and aspirates are unacceptable for Xpert Xpress SARS-CoV-2/FLU/RSV testing.  Fact Sheet for Patients: BloggerCourse.com  Fact Sheet for Healthcare Providers: SeriousBroker.it  This test is not yet approved or cleared by the [place] FDA  and has been authorized for detection and/or diagnosis of SARS-CoV-2 by FDA under an Emergency Use Authorization (EUA). This EUA will remain in effect (meaning this test can be used) for the duration of the COVID-19 declaration under Section 564(b)(1) of the Act, 21 U.S.C. section 360bbb-3(b)(1), unless the authorization is terminated or revoked.  Performed at [facility], [address] [place], [place] [address]      Studies: No results found.   Joycelyn Das, MD  [facility] 03/11/2021  If 7PM-7AM, please contact night-coverage

## 2021-03-11 NOTE — Consult Note (Signed)
Renal Service Consult Note [facility]  Morgan Reese 03/11/2021 [name], MD Requesting Physician: Dr. Tyson Babinski  Reason for Consult: Hyponatremia HPI: The patient is a 70 y.o. year-old w/ hx of HTN, thyroid disease presented to ED on 10/18 yesterday concerned about her BP being high and also concerned about recent thyroid issues. In ED VS were stable, Na+ was 119, other labs were wnl. Pt was admitted and started on salt tabs w/ po lasix low dose and fluid restriction. Today Na+ is up to 130. Asked to see for hyponatremia.   Pt seen in room.  States that she has been taking thryoid supplement for some time, but after she had COVID illness recently, that her "thyroid got better" and she did testing showing she now was having "high thyroid". She was very anxious and there was question of panic attack, vs just symptoms of hyperthyroid.  She was given prozac, took one dose and had loose stools which was "very bad".  She was told then to switch to celexa, but didn't fill that Rx.    Denies hx of heavy beer intake, is not a skimpy eater they eat 3 meals a day, she has no hx of heart/ renal / liver failure, no hx adrenal disease.     ROS - denies CP, no joint pain, no HA, no blurry vision, no rash, no diarrhea, no nausea/ vomiting, no dysuria, no difficulty voiding   Past Medical History  Past Medical History:  Diagnosis Date   Hypertension    Sinus congestion    Thyroid disease    Past Surgical History  Past Surgical History:  Procedure Laterality Date   NASAL SINUS SURGERY     TUBAL LIGATION     Family History  Family History  Problem Relation Age of Onset   Depression Mother    Cancer Father    Stroke Paternal Uncle    Diabetes Paternal Grandmother    Social History  reports that she has never smoked. She has never used smokeless tobacco. She reports that she does not drink alcohol and does not use drugs. Allergies  Allergies  Allergen Reactions    Amoxicillin     REACTION: Head felt over-heated   Home medications Prior to Admission medications   Medication Sig Start Date End Date Taking? Authorizing Provider  diltiazem (TIAZAC) 120 MG 24 hr capsule Take 120 mg by mouth daily.   Yes [provider]  estradiol (ESTRACE) 0.1 MG/GM vaginal cream Place 1 Applicatorful vaginally at bedtime.   Yes [provider]  hydrOXYzine (ATARAX/VISTARIL) 25 MG tablet Take 1 tablet (25 mg total) by mouth every 6 (six) hours as needed for anxiety. 03/01/21  Yes Jacalyn Lefevre, MD  levothyroxine (SYNTHROID) 50 MCG tablet Take 50 mcg by mouth daily before breakfast.   Yes [provider]  metoprolol tartrate (LOPRESSOR) 25 MG tablet Take 1 tablet (25 mg total) by mouth daily as needed. 01/01/21  Yes Charlynne Pander, MD  progesterone (PROMETRIUM) 200 MG capsule Take 200 mg by mouth daily.   Yes [provider]  pyridOXINE (B-6) 50 MG tablet Take 50 mg by mouth daily.   Yes [provider]  Vitamin D, Ergocalciferol, (DRISDOL) 50000 UNITS CAPS capsule Take 50,000 Units by mouth every 7 (seven) days.   Yes [provider]  zinc gluconate 50 MG tablet Take 50 mg by mouth daily.   Yes [provider]  NATURE-THROID 81.25 MG TABS TAKE 1 TABLET BY MOUTH  TWICE A DAY 11/27/15   Monica Becton, MD     Vitals:   03/10/21 2258 03/11/21 0332 03/11/21 1038 03/11/21 1322  BP: 92/63 120/74 119/85 120/70  Pulse: 73 80 89 84  Resp: 18 20  20   Temp: 97.9 F (36.6 C) 98.5 F (36.9 C)  97.7 F (36.5 C)  TempSrc: Axillary Oral  Oral  SpO2: 96% 96%  95%  Weight:      Height:       Exam Gen alert, no distress No rash, cyanosis or gangrene Sclera anicteric, throat clear  No jvd or bruits Chest clear bilat to bases, no rales/ wheezing RRR no MRG Abd soft ntnd no mass or ascites +bs GU defer MS no joint effusions or deformity Ext no LE or UE edema, no wounds or ulcers Neuro is alert, Ox 3 ,  nf         Home meds include - diltiazem 120, estradiol, synthroid, lopressor, progesterone, B-6, vit D, zinc, nature thyroid 91.25 qd, prn's    UNa 116,  UOsm 472,  51   Na today = 132, K 4 CO2 24 , BUN 14  Creat 0.54    Am cortisol = 15.9 (6- 23), stim test is wnl   Assessment/ Plan: Hyponatremia - pt is euvolemic on exam, Na 119 on admit, no prior hx. No evidence of heart or liver failure, no renal failure and no adrenal failure. Has known thyroid disease.  Hx does not fit tea and toast syndrome. Suspect this is SIADH due to SSRI, vs anxiety/ panic attack triggering ADH release in pt who has high baseline water intake. Very much improved overnight w/ fluid restriction/ salt tabs and low dose po lasix. Salt and lasix on hold for now. Assuming Na remains up (> 129), would be okay for dc on fluid restriction (32 oz per day) at home until she can get f/u labs w/ her PCP in 1-2 weeks. If Na+ drops again here, call and I will give other rec's for treatment. She should avoid SSRI's.  No other suggestions, will sign off.  HTN Thyroid disease - per pmd    [name]  MD 03/11/2021, 3:30 PM  Recent Labs  Lab 03/10/21 0720 03/11/21 0316  WBC 7.8 4.8  HGB 13.3 13.2   Recent Labs  Lab 03/11/21 0316 03/11/21 0842 03/11/21 1330  K 3.7 3.7 4.0  BUN 12 13 14   CREATININE 0.57 0.54 0.54  CALCIUM 8.7* 8.7* 8.9  PHOS 3.8 3.8  --

## 2021-03-12 DIAGNOSIS — E222 Syndrome of inappropriate secretion of antidiuretic hormone: Secondary | ICD-10-CM | POA: Diagnosis not present

## 2021-03-12 DIAGNOSIS — F419 Anxiety disorder, unspecified: Secondary | ICD-10-CM | POA: Diagnosis not present

## 2021-03-12 DIAGNOSIS — E871 Hypo-osmolality and hyponatremia: Secondary | ICD-10-CM | POA: Diagnosis not present

## 2021-03-12 DIAGNOSIS — E039 Hypothyroidism, unspecified: Secondary | ICD-10-CM | POA: Diagnosis not present

## 2021-03-12 DIAGNOSIS — I10 Essential (primary) hypertension: Secondary | ICD-10-CM | POA: Diagnosis not present

## 2021-03-12 NOTE — Discharge Summary (Signed)
Physician Discharge Summary  Morgan Reese:[id number] DOB: Jul 05, 1950 DOA: 03/10/2021  PCP: Morgan Soho, PA-C  Admit date: 03/10/2021 Discharge date: 03/12/2021  Admitted From: Home  Discharge disposition: Home   Recommendations for Outpatient Follow-Up:   Follow up with your primary care provider in one week.  Check CBC, BMP, magnesium in the next visit Patient had significant hyponatremia on presentation.  Patient has been advised 32 ounces fluid restriction as per nephrology.  Please do not use SSRI.  Please check BMP in the next visit.  If sodium levels are still low despite fluid restriction might benefit from sodium tablets as outpatient  Discharge Diagnosis:   Active Problems:   Hyponatremia Hypothyroidism Essential hypertension  Discharge Condition: Improved.  Diet recommendation: Low sodium, heart healthy.    Wound care: None.  Code status: Full.   History of Present Illness:   Morgan Reese is a 70 y.o. female with medical history significant of hypothyroidism, essential HTN  presented to the hospital with feeling of anxiousness and agitation.  Per her husband he thought that she was probably having a panic attack and that she might pass out.  She had elevated heart rate and blood pressure.  Primary care had been adjusting her thyroid medications as outpatient.  However in the ED, patient was noted to have sodium level of 119.  Urine and serum studies were sent and patient was admitted to hospital for further evaluation.Marland Kitchen  Hospital Course:   Following conditions were addressed during hospitalization as listed below,  Significant hyponatremia. Patient presented with sodium levels of 119.  Patient stated that she had been drinking a lot of water at home.  No recent use of diuretics or SSRI.  Patient had low serum osmolality high urine osmolality and high urinary sodium.  Thought to be secondary to SIADH due to anxiety coupled with excessive  water ingestion.  Sodium level has  improved after fluid restriction, salt tablet and low-dose Lasix.  Sodium will improve so subsequently was liberalized on fluid restriction.  Patient will be going home with 32 ounces fluid restriction.  Will need BMP in the next visit.  If hyponatremia persists, she might benefit from sodium tablets as outpatient.  Nephrology also followed the patient during hospitalization.  Hypothyroidism TSH within normal range.  T4 slightly elevated.  T3 slightly low.  Patient wishes endocrinology follow-up.  Ambulatory referral has been made to Morgan Reese.    Anxiety PCP had attempted SSRI but is currently not taking it because of side effects.  She was advised not to take it due to risk for severe hyponatremia.  On Atarax as outpatient.   Essential hypertension Patient is on Cardizem CD at home.  We will continue on discharge..  Disposition.  At this time, patient is stable for disposition home with outpatient PCP/endocrinology follow-up.  Medical Consultants:   Nephrology  Procedures:    None Subjective:   Today, patient was seen and examined at bedside.  Patient complains of mild anxiety.  Denies any chest pain, dizziness, lightheadedness or shortness of breath  Discharge Exam:   Vitals:   03/11/21 2105 03/12/21 0345  BP: 126/67 130/72  Pulse: 84 88  Resp: 20 20  Temp: 97.8 F (36.6 C) 98.4 F (36.9 C)  SpO2: 94% 96%   Vitals:   03/11/21 1038 03/11/21 1322 03/11/21 2105 03/12/21 0345  BP: 119/85 120/70 126/67 130/72  Pulse: 89 84 84 88  Resp:  20 20 20   Temp:  97.7 F (36.5  C) 97.8 F (36.6 C) 98.4 F (36.9 C)  TempSrc:  Oral Oral Oral  SpO2:  95% 94% 96%  Weight:      Height:       General: Alert awake, not in obvious distress HENT: pupils equally reacting to light,  No scleral pallor or icterus noted. Oral mucosa is moist.  Chest:  Clear breath sounds.  Diminished breath sounds bilaterally. No crackles or wheezes.  CVS: S1 &S2 heard.  No murmur.  Regular rate and rhythm. Abdomen: Soft, nontender, nondistended.  Bowel sounds are heard.   Extremities: No cyanosis, clubbing or edema.  Peripheral pulses are palpable. Psych: Alert, awake and oriented, mildly anxious mood CNS:  No cranial nerve deficits.  Power equal in all extremities.   Skin: Warm and dry.  No rashes noted.  The results of significant diagnostics from this hospitalization (including imaging, microbiology, ancillary and laboratory) are listed below for reference.     Diagnostic Studies:   No results found.   Labs:   Basic Metabolic Panel: Recent Labs  Lab 03/10/21 0720 03/10/21 0925 03/10/21 1453 03/10/21 2044 03/11/21 0316 03/11/21 0842 03/11/21 1330 03/11/21 1819  NA 119*   < > 122* 127* 130* 130* 132* 131*  K 3.5   < > 3.6 3.9 3.7 3.7 4.0 3.9  CL 87*   < > 92* 99 100 100 101 101  CO2 22   < > 22 19* 24 24 24 24   GLUCOSE 116*   < > 107* 94 90 95 114* 136*  BUN 8   < > 9 12 12 13 14 17   CREATININE 0.31*   < > 0.43* 0.46 0.57 0.54 0.54 0.64  CALCIUM 8.7*   < > 8.9 8.7* 8.7* 8.7* 8.9 8.8*  MG 1.7  --   --   --   --   --   --   --   PHOS  --   --  3.1 3.8 3.8 3.8  --   --    < > = values in this interval not displayed.   GFR Estimated Creatinine Clearance: 53.4 mL/min (by C-G formula based on SCr of 0.64 mg/dL). Liver Function Tests: Recent Labs  Lab 03/10/21 0720 03/10/21 1453 03/10/21 2044 03/11/21 0316 03/11/21 0842  AST 23  --   --   --   --   ALT 18  --   --   --   --   ALKPHOS 56  --   --   --   --   BILITOT 1.0  --   --   --   --   PROT 6.2*  --   --   --   --   ALBUMIN 4.0 3.8 3.6 3.5 3.8   No results for input(s): LIPASE, AMYLASE in the last 168 hours. No results for input(s): AMMONIA in the last 168 hours. Coagulation profile No results for input(s): INR, PROTIME in the last 168 hours.  CBC: Recent Labs  Lab 03/10/21 0720 03/11/21 0316  WBC 7.8 4.8  HGB 13.3 13.2  HCT 37.3 38.1  MCV 85.4 89.0  PLT 221 211    Cardiac Enzymes: No results for input(s): CKTOTAL, CKMB, CKMBINDEX, TROPONINI in the last 168 hours. BNP: Invalid input(s): POCBNP CBG: No results for input(s): GLUCAP in the last 168 hours. D-Dimer No results for input(s): DDIMER in the last 72 hours. Hgb A1c No results for input(s): HGBA1C in the last 72 hours. Lipid Profile No results for input(s): CHOL,  HDL, LDLCALC, TRIG, CHOLHDL, LDLDIRECT in the last 72 hours. Thyroid function studies Recent Labs    03/10/21 0720  TSH 1.071  T3FREE 1.8*   Anemia work up No results for input(s): VITAMINB12, FOLATE, FERRITIN, TIBC, IRON, RETICCTPCT in the last 72 hours. Microbiology Recent Results (from the past 240 hour(s))  Resp Panel by RT-PCR (Flu A&B, Covid) Nasopharyngeal Swab     Status: None   Collection Time: 03/10/21  9:25 AM   Specimen: Nasopharyngeal Swab; Nasopharyngeal(NP) swabs in vial transport medium  Result Value Ref Range Status   SARS Coronavirus 2 by RT PCR NEGATIVE NEGATIVE Final    Comment: (NOTE) SARS-CoV-2 target nucleic acids are NOT DETECTED.  The SARS-CoV-2 RNA is generally detectable in upper respiratory specimens during the acute phase of infection. The lowest concentration of SARS-CoV-2 viral copies this assay can detect is 138 copies/mL. A negative result does not preclude SARS-Cov-2 infection and should not be used as the sole basis for treatment or other patient management decisions. A negative result may occur with  improper specimen collection/handling, submission of specimen other than nasopharyngeal swab, presence of viral mutation(s) within the areas targeted by this assay, and inadequate number of viral copies(<138 copies/mL). A negative result must be combined with clinical observations, patient history, and epidemiological information. The expected result is Negative.  Fact Sheet for Patients:  BloggerCourse.com  Fact Sheet for Healthcare Providers:   SeriousBroker.it  This test is no t yet approved or cleared by the [place] FDA and  has been authorized for detection and/or diagnosis of SARS-CoV-2 by FDA under an Emergency Use Authorization (EUA). This EUA will remain  in effect (meaning this test can be used) for the duration of the COVID-19 declaration under Section 564(b)(1) of the Act, 21 U.S.C.section 360bbb-3(b)(1), unless the authorization is terminated  or revoked sooner.       Influenza A by PCR NEGATIVE NEGATIVE Final   Influenza B by PCR NEGATIVE NEGATIVE Final    Comment: (NOTE) The Xpert Xpress SARS-CoV-2/FLU/RSV plus assay is intended as an aid in the diagnosis of influenza from Nasopharyngeal swab specimens and should not be used as a sole basis for treatment. Nasal washings and aspirates are unacceptable for Xpert Xpress SARS-CoV-2/FLU/RSV testing.  Fact Sheet for Patients: BloggerCourse.com  Fact Sheet for Healthcare Providers: SeriousBroker.it  This test is not yet approved or cleared by the [place] FDA and has been authorized for detection and/or diagnosis of SARS-CoV-2 by FDA under an Emergency Use Authorization (EUA). This EUA will remain in effect (meaning this test can be used) for the duration of the COVID-19 declaration under Section 564(b)(1) of the Act, 21 U.S.C. section 360bbb-3(b)(1), unless the authorization is terminated or revoked.  Performed at [facility], [address], [place], [place] [address]      Discharge Instructions:   Discharge Instructions     Ambulatory referral to Endocrinology   Complete by: As directed    hypothyroidism   Diet - low sodium heart healthy   Complete by: As directed    Discharge instructions   Complete by: As directed    Fluid restriction 32 ounces in 24 hours.  Please follow-up with your family physician within 1 week and check blood work at  that time.  Seek medical attention for worsening symptoms. Do not take prozac, talk to your family doctor about other options for anxiety that doesn't cause low sodium if needed. Follow up with your endocrinology doctor about your thyroid problem.  Increase activity slowly   Complete by: As directed       Allergies as of 03/12/2021       Reactions   Amoxicillin    REACTION: Head felt over-heated        Medication List     STOP taking these medications    Nature-Throid 81.25 MG Tabs Generic drug: Thyroid       TAKE these medications    diltiazem 120 MG 24 hr capsule Commonly known as: TIAZAC Take 120 mg by mouth daily.   estradiol 0.1 MG/GM vaginal cream Commonly known as: ESTRACE Place 1 Applicatorful vaginally at bedtime.   hydrOXYzine 25 MG tablet Commonly known as: ATARAX/VISTARIL Take 1 tablet (25 mg total) by mouth every 6 (six) hours as needed for anxiety.   levothyroxine 50 MCG tablet Commonly known as: SYNTHROID Take 50 mcg by mouth daily before breakfast.   metoprolol tartrate 25 MG tablet Commonly known as: LOPRESSOR Take 1 tablet (25 mg total) by mouth daily as needed.   progesterone 200 MG capsule Commonly known as: PROMETRIUM Take 200 mg by mouth daily.   pyridOXINE 50 MG tablet Commonly known as: B-6 Take 50 mg by mouth daily.   Vitamin D (Ergocalciferol) 1.25 MG (50000 UNIT) Caps capsule Commonly known as: DRISDOL Take 50,000 Units by mouth every 7 (seven) days.   zinc gluconate 50 MG tablet Take 50 mg by mouth daily.        Follow-up Information     Dorisann Frames, MD. Schedule an appointment as soon as possible for a visit in 1 week(s).   Specialty: Endocrinology Contact information: [address] [place] [address] [phone]         Morgan Soho, PA-C. Schedule an appointment as soon as possible for a visit in 1 week(s).   Specialty: Family Medicine Why: for blood work Contact  information: [address] [place] [address] [phone]                Time coordinating discharge: 39 minutes  Signed:  Conn Trombetta  [facility] 03/12/2021, 9:01 AM

## 2021-03-18 DIAGNOSIS — E871 Hypo-osmolality and hyponatremia: Secondary | ICD-10-CM | POA: Diagnosis not present

## 2021-03-18 DIAGNOSIS — R251 Tremor, unspecified: Secondary | ICD-10-CM | POA: Diagnosis not present

## 2021-03-24 DIAGNOSIS — E871 Hypo-osmolality and hyponatremia: Secondary | ICD-10-CM | POA: Diagnosis not present

## 2021-03-24 DIAGNOSIS — E039 Hypothyroidism, unspecified: Secondary | ICD-10-CM | POA: Diagnosis not present

## 2021-03-24 DIAGNOSIS — I10 Essential (primary) hypertension: Secondary | ICD-10-CM | POA: Diagnosis not present

## 2021-04-21 DIAGNOSIS — R300 Dysuria: Secondary | ICD-10-CM | POA: Diagnosis not present

## 2021-04-21 DIAGNOSIS — E039 Hypothyroidism, unspecified: Secondary | ICD-10-CM | POA: Diagnosis not present

## 2021-04-21 DIAGNOSIS — E871 Hypo-osmolality and hyponatremia: Secondary | ICD-10-CM | POA: Diagnosis not present

## 2021-04-22 ENCOUNTER — Other Ambulatory Visit: Payer: Self-pay

## 2021-04-22 ENCOUNTER — Ambulatory Visit: Payer: Medicare PPO | Admitting: Cardiovascular Disease

## 2021-04-22 ENCOUNTER — Encounter: Payer: Self-pay | Admitting: Cardiovascular Disease

## 2021-04-22 VITALS — BP 125/74 | HR 86 | Ht 61.0 in | Wt 136.4 lb

## 2021-04-22 DIAGNOSIS — E871 Hypo-osmolality and hyponatremia: Secondary | ICD-10-CM | POA: Diagnosis not present

## 2021-04-22 DIAGNOSIS — R9431 Abnormal electrocardiogram [ECG] [EKG]: Secondary | ICD-10-CM

## 2021-04-22 DIAGNOSIS — R0602 Shortness of breath: Secondary | ICD-10-CM

## 2021-04-22 DIAGNOSIS — E039 Hypothyroidism, unspecified: Secondary | ICD-10-CM | POA: Diagnosis not present

## 2021-04-22 NOTE — Progress Notes (Signed)
Cardiology Office Note:    Date:  04/26/2021   ID:  Morgan Reese, DOB 08/31/1950, MRN [id number]  PCP:  Jarrett Soho, PA-C   [facility] Providers Cardiologist:  Thurmon Fair, MD     Referring MD: Jarrett Soho, PA-C   Chief Complaint  Patient presents with   Palpitations  Morgan Reese is a 70 y.o. female who is being seen today for the evaluation of abnormal electrocardiogram and palpitations at the request of Jarrett Soho, [place].   History of Present Illness:    Morgan Reese is a 70 y.o. female with a hx of generally good health, treated essential hypertension and acquired hypothyroidism presenting with complaints of palpitations and chest tightness.  These have been going on now for few months.  Her exercise tolerance has diminished.  She becomes out of breath walking the route in [facility] that used to previously be easy for her.  She now has to "stop at every bench".  She does not become short of breath with activities of personal care or light household chores.  In July she had COVID infection.  August she was seen in the clinic with complaints of anxiety palpitations and she was tachycardic.  She was found to have a low TSH and her thyroid supplement dose was decreased.  In mid October, she had problems with severe hyponatremia (sodium 119) that appears to be partly related her thyroid disorder and partly use of an SSRI.  Her thyroid dosage was changed, SSRI was stopped and she has had normal TSH values for the last 2 months.  Despite this the palpitations and the shortness of breath persist.  She denies lower extremity edema, orthopnea, PND, cough, hemoptysis, fever, chills, focal neurological complaints, intermittent claudication, dizziness or syncope  Repeated electrocardiograms show mild sinus tachycardia, often with a very short PR interval, but no evidence of preexcitation.  She typically has nonspecific ST-T segment changes most  obvious in leads V5, V6, III and aVF, with a normal QT.  The tracing from August showed a single PVC.  Past Medical History:  Diagnosis Date   Hypertension    Sinus congestion    Thyroid disease     Past Surgical History:  Procedure Laterality Date   NASAL SINUS SURGERY     TUBAL LIGATION      Current Medications: Current Meds  Medication Sig   diltiazem (TIAZAC) 120 MG 24 hr capsule Take 120 mg by mouth daily.   estradiol (ESTRACE) 0.1 MG/GM vaginal cream Place 1 Applicatorful vaginally at bedtime.   hydrOXYzine (ATARAX/VISTARIL) 25 MG tablet Take 1 tablet (25 mg total) by mouth every 6 (six) hours as needed for anxiety.   levothyroxine (SYNTHROID) 50 MCG tablet Take 50 mcg by mouth daily before breakfast.   metoprolol tartrate (LOPRESSOR) 25 MG tablet Take 1 tablet (25 mg total) by mouth daily as needed. (Patient taking differently: Take 25 mg by mouth 2 (two) times daily. 50 MG TOTAL)   progesterone (PROMETRIUM) 200 MG capsule Take 200 mg by mouth daily.   pyridOXINE (B-6) 50 MG tablet Take 50 mg by mouth daily.   zinc gluconate 50 MG tablet Take 50 mg by mouth daily.     Allergies:   Amoxicillin   Social History   Socioeconomic History   Marital status: Married    Spouse name: Not on file   Number of children: Not on file   Years of education: Not on file   Highest education level: Not  on file  Occupational History   Not on file  Tobacco Use   Smoking status: Never   Smokeless tobacco: Never  Vaping Use   Vaping Use: Never used  Substance and Sexual Activity   Alcohol use: Never   Drug use: Never   Sexual activity: Not on file  Other Topics Concern   Not on file  Social History Narrative   Not on file   Social Determinants of Health   Financial Resource Strain: Not on file  Food Insecurity: Not on file  Transportation Needs: Not on file  Physical Activity: Not on file  Stress: Not on file  Social Connections: Not on file     Family History: The  patient's family history includes Cancer in her father; Depression in her mother; Diabetes in her paternal grandmother; Stroke in her paternal uncle.  ROS:   Please see the history of present illness.     All other systems reviewed and are negative.  EKGs/Labs/Other Studies Reviewed:    The following studies were reviewed today: Initial ECG from August 15, ECG from hospitalization October and multiple labs and notes from that admission.  EKG:  EKG is ordered today.  The ekg ordered today demonstrates sinus rhythm with a short PR interval of 104 ms, but without evidence of preexcitation nonspecific ST-T changes in leads V5, V6, 3, aVF, QTC 387 ms  Recent Labs: 03/01/2021: B Natriuretic Peptide 262.8 03/10/2021: ALT 18; Magnesium 1.7; TSH 1.071 03/11/2021: BUN 17; Creatinine, Ser 0.64; Hemoglobin 13.2; Platelets 211; Potassium 3.9; Sodium 131  Recent Lipid Panel No results found for: CHOL, TRIG, HDL, CHOLHDL, VLDL, LDLCALC, LDLDIRECT   Risk Assessment/Calculations:           Physical Exam:    VS:  BP 125/74   Pulse 86   Ht 5\' 1"  (1.549 m)   Wt 136 lb 6.4 oz (61.9 kg)   SpO2 96%   BMI 25.77 kg/m     Wt Readings from Last 3 Encounters:  04/22/21 136 lb 6.4 oz (61.9 kg)  03/10/21 135 lb (61.2 kg)  03/01/21 135 lb (61.2 kg)     GEN: Lean, well nourished, well developed in no acute distress HEENT: Normal NECK: No JVD; No carotid bruits LYMPHATICS: No lymphadenopathy CARDIAC:  RRR, no murmurs, rubs, gallops RESPIRATORY:  Clear to auscultation without rales, wheezing or rhonchi  ABDOMEN: Soft, non-tender, non-distended MUSCULOSKELETAL:  No edema; No deformity  SKIN: Warm and dry NEUROLOGIC:  Alert and oriented x 3 PSYCHIATRIC:  Normal affect   ASSESSMENT:    1. Shortness of breath   2. Nonspecific abnormal electrocardiogram (ECG) (EKG)   3. Acquired hypothyroidism   4. Hyponatremia    PLAN:    In order of problems listed above:  Exertional dyspnea: There are no  clear cardiac abnormalities on physical exam.  We will schedule for an echocardiogram.  The symptoms are not typical for coronary insufficiency, but this is possible at her age.  She really does not have other coronary risk factors.  Note that her BNP was mildly elevated at 263 during her hospital admission with hyponatremia.  There are no other older values available for comparison. Abnormal EKG: Despite the short PR, she is never really had true arrhythmia other than scattered PVCs.  There is no evidence of preexcitation on the electrocardiogram. History of hypothyroidism and iatrogenic hyperthyroidism: TSH has now been in normal range for the last 2 monthly assays and its unlikely that her thyroid condition still has bearing  on her symptoms at this point.  Her endocrinologist is Dr. Talmage Nap. Hyponatremia: Attributed to her abnormal thyroid function and use of SSRI.  Sodium level was significantly improved, but not normal by the time of the discharge in October.  I do not see any repeat assay since hospital discharge.           Medication Adjustments/Labs and Tests Ordered: Current medicines are reviewed at length with the patient today.  Concerns regarding medicines are outlined above.  Orders Placed This Encounter  Procedures   EKG 12-Lead   ECHOCARDIOGRAM COMPLETE   No orders of the defined types were placed in this encounter.   Patient Instructions  Medication Instructions:  No changes *If you need a refill on your cardiac medications before your next appointment, please call your pharmacy*   Lab Work: None ordered If you have labs (blood work) drawn today and your tests are completely normal, you will receive your results only by: MyChart Message (if you have MyChart) OR A paper copy in the mail If you have any lab test that is abnormal or we need to change your treatment, we will call you to review the results.   Testing/Procedures: Your physician has requested that you have  an echocardiogram. Echocardiography is a painless test that uses sound waves to create images of your heart. It provides your doctor with information about the size and shape of your heart and how well your heart's chambers and valves are working. You may receive an ultrasound enhancing agent through an IV if needed to better visualize your heart during the echo.This procedure takes approximately one hour. There are no restrictions for this procedure. This will take place at the [address].    Follow-Up: At [facility], you and your health needs are our priority.  As part of our continuing mission to provide you with exceptional heart care, we have created designated Provider Care Teams.  These Care Teams include your primary Cardiologist (physician) and Advanced Practice Providers (APPs -  Physician Assistants and Nurse Practitioners) who all work together to provide you with the care you need, when you need it.  We recommend signing up for the patient portal called "MyChart".  Sign up information is provided on this After Visit Summary.  MyChart is used to connect with patients for Virtual Visits (Telemedicine).  Patients are able to view lab/test results, encounter notes, upcoming appointments, etc.  Non-urgent messages can be sent to your provider as well.   To learn more about what you can do with MyChart, go to [email or web address].    Your next appointment:   3 month(s)  The format for your next appointment:   In Person  Provider:   None      Signed, Thurmon Fair, MD  04/26/2021 4:28 PM    [facility]

## 2021-04-22 NOTE — Patient Instructions (Signed)
Medication Instructions:  No changes *If you need a refill on your cardiac medications before your next appointment, please call your pharmacy*   Lab Work: None ordered If you have labs (blood work) drawn today and your tests are completely normal, you will receive your results only by: MyChart Message (if you have MyChart) OR A paper copy in the mail If you have any lab test that is abnormal or we need to change your treatment, we will call you to review the results.   Testing/Procedures: Your physician has requested that you have an echocardiogram. Echocardiography is a painless test that uses sound waves to create images of your heart. It provides your doctor with information about the size and shape of your heart and how well your heart's chambers and valves are working. You may receive an ultrasound enhancing agent through an IV if needed to better visualize your heart during the echo.This procedure takes approximately one hour. There are no restrictions for this procedure. This will take place at the [address], [address].    Follow-Up: At [facility], you and your health needs are our priority.  As part of our continuing mission to provide you with exceptional heart care, we have created designated Provider Care Teams.  These Care Teams include your primary Cardiologist (physician) and Advanced Practice Providers (APPs -  Physician Assistants and Nurse Practitioners) who all work together to provide you with the care you need, when you need it.  We recommend signing up for the patient portal called "MyChart".  Sign up information is provided on this After Visit Summary.  MyChart is used to connect with patients for Virtual Visits (Telemedicine).  Patients are able to view lab/test results, encounter notes, upcoming appointments, etc.  Non-urgent messages can be sent to your provider as well.   To learn more about what you can do with MyChart, go to [email or web address].     Your next appointment:   3 month(s)  The format for your next appointment:   In Person  Provider:   None

## 2021-04-25 DIAGNOSIS — N3000 Acute cystitis without hematuria: Secondary | ICD-10-CM | POA: Diagnosis not present

## 2021-04-25 DIAGNOSIS — R3915 Urgency of urination: Secondary | ICD-10-CM | POA: Diagnosis not present

## 2021-04-26 ENCOUNTER — Encounter: Payer: Self-pay | Admitting: Cardiovascular Disease

## 2021-05-02 DIAGNOSIS — N949 Unspecified condition associated with female genital organs and menstrual cycle: Secondary | ICD-10-CM | POA: Diagnosis not present

## 2021-05-02 DIAGNOSIS — N898 Other specified noninflammatory disorders of vagina: Secondary | ICD-10-CM | POA: Diagnosis not present

## 2021-05-02 DIAGNOSIS — N8110 Cystocele, unspecified: Secondary | ICD-10-CM | POA: Diagnosis not present

## 2021-05-02 DIAGNOSIS — B3731 Acute candidiasis of vulva and vagina: Secondary | ICD-10-CM | POA: Diagnosis not present

## 2021-05-05 DIAGNOSIS — N94818 Other vulvodynia: Secondary | ICD-10-CM | POA: Diagnosis not present

## 2021-05-05 DIAGNOSIS — E039 Hypothyroidism, unspecified: Secondary | ICD-10-CM | POA: Diagnosis not present

## 2021-05-05 DIAGNOSIS — R350 Frequency of micturition: Secondary | ICD-10-CM | POA: Diagnosis not present

## 2021-05-05 DIAGNOSIS — R351 Nocturia: Secondary | ICD-10-CM | POA: Diagnosis not present

## 2021-05-05 DIAGNOSIS — N907 Vulvar cyst: Secondary | ICD-10-CM | POA: Diagnosis not present

## 2021-05-05 DIAGNOSIS — N812 Incomplete uterovaginal prolapse: Secondary | ICD-10-CM | POA: Diagnosis not present

## 2021-05-05 DIAGNOSIS — R82998 Other abnormal findings in urine: Secondary | ICD-10-CM | POA: Diagnosis not present

## 2021-05-11 ENCOUNTER — Other Ambulatory Visit: Payer: Self-pay

## 2021-05-11 ENCOUNTER — Ambulatory Visit (HOSPITAL_COMMUNITY): Payer: Medicare PPO | Attending: Cardiovascular Disease

## 2021-05-11 DIAGNOSIS — R0602 Shortness of breath: Secondary | ICD-10-CM | POA: Diagnosis not present

## 2021-05-11 LAB — ECHOCARDIOGRAM COMPLETE
Area-P 1/2: 3.88 cm2
P 1/2 time: 336 msec
S' Lateral: 2.5 cm

## 2021-05-12 DIAGNOSIS — H2512 Age-related nuclear cataract, left eye: Secondary | ICD-10-CM | POA: Diagnosis not present

## 2021-05-29 DIAGNOSIS — H2512 Age-related nuclear cataract, left eye: Secondary | ICD-10-CM | POA: Diagnosis not present

## 2021-05-29 DIAGNOSIS — H2513 Age-related nuclear cataract, bilateral: Secondary | ICD-10-CM | POA: Diagnosis not present

## 2021-06-05 ENCOUNTER — Other Ambulatory Visit: Payer: Self-pay

## 2021-06-05 ENCOUNTER — Ambulatory Visit: Payer: Medicare PPO | Admitting: Podiatry

## 2021-06-05 DIAGNOSIS — M21861 Other specified acquired deformities of right lower leg: Secondary | ICD-10-CM | POA: Diagnosis not present

## 2021-06-09 NOTE — Progress Notes (Signed)
Subjective:  Patient ID: Morgan Reese, female    DOB: 11-28-1950,  MRN: [id number]  Chief Complaint  Patient presents with   Foot Pain    PT stated that she would like to have her feet examined     71 y.o. female presents with the above complaint.  Patient presents with complaint of right tight Achilles tendon.  Patient states that she would like to get it examined.  She is does not she states that there is tightness of back of the calf and Achilles when she is ambulating.  Does not cause her pain she wanted get it evaluated she denies any other acute complaints.  She has not seen anyone as per to see me.  0 out of 10 pain scale.  No pain with ambulation.  Ambulates in regular shoes.   Review of Systems: Negative except as noted in the HPI. Denies N/V/F/Ch.  Past Medical History:  Diagnosis Date   Hypertension    Sinus congestion    Thyroid disease     Current Outpatient Medications:    diltiazem (TIAZAC) 120 MG 24 hr capsule, Take 120 mg by mouth daily., Disp: , Rfl:    estradiol (ESTRACE) 0.1 MG/GM vaginal cream, Place 1 Applicatorful vaginally at bedtime., Disp: , Rfl:    hydrOXYzine (ATARAX/VISTARIL) 25 MG tablet, Take 1 tablet (25 mg total) by mouth every 6 (six) hours as needed for anxiety., Disp: 20 tablet, Rfl: 0   levothyroxine (SYNTHROID) 50 MCG tablet, Take 50 mcg by mouth daily before breakfast., Disp: , Rfl:    metoprolol tartrate (LOPRESSOR) 25 MG tablet, Take 1 tablet (25 mg total) by mouth daily as needed. (Patient taking differently: Take 25 mg by mouth 2 (two) times daily. 50 MG TOTAL), Disp: 10 tablet, Rfl: 0   progesterone (PROMETRIUM) 200 MG capsule, Take 200 mg by mouth daily., Disp: , Rfl:    pyridOXINE (B-6) 50 MG tablet, Take 50 mg by mouth daily., Disp: , Rfl:    Vitamin D, Ergocalciferol, (DRISDOL) 50000 UNITS CAPS capsule, Take 50,000 Units by mouth every 7 (seven) days. (Patient not taking: Reported on 04/22/2021), Disp: , Rfl:    zinc gluconate 50 MG  tablet, Take 50 mg by mouth daily., Disp: , Rfl:   Social History   Tobacco Use  Smoking Status Never  Smokeless Tobacco Never    Allergies  Allergen Reactions   Amoxicillin     REACTION: Head felt over-heated   Objective:  There were no vitals filed for this visit. There is no height or weight on file to calculate BMI. Constitutional Well developed. Well nourished.  Vascular Dorsalis pedis pulses palpable bilaterally. Posterior tibial pulses palpable bilaterally. Capillary refill normal to all digits.  No cyanosis or clubbing noted. Pedal hair growth normal.  Neurologic Normal speech. Oriented to person, place, and time. Epicritic sensation to light touch grossly present bilaterally.  Dermatologic Nails well groomed and normal in appearance. No open wounds. No skin lesions.  Orthopedic: Positive Silfverskiold test with gastrocnemius equinus noted to the right lower extremity.  Gait examination is negative for heel left when ambulating.  Patient is able to fully perform single and double heel raise.   Radiographs: None Assessment:   1. Gastrocnemius equinus, right    Plan:  Patient was evaluated and treated and all questions answered.  Right gastrocnemius equinus -I explained the patient etiology of equinus of her instrument options were extensively discussed. -I discussed with her the benefit of stretching exercises and I showed  and demonstrated her some very stretching exercises that are available. -If it continues to bother her we can discuss releasing for physical therapy in the future.  She states understanding. -At this time the Achilles tightness is not affecting her gait and therefore I will hold off on doing any kind of surgical intervention.  She states understanding  No follow-ups on file.

## 2021-06-12 DIAGNOSIS — F411 Generalized anxiety disorder: Secondary | ICD-10-CM | POA: Diagnosis not present

## 2021-06-12 DIAGNOSIS — E038 Other specified hypothyroidism: Secondary | ICD-10-CM | POA: Diagnosis not present

## 2021-06-13 ENCOUNTER — Encounter (HOSPITAL_BASED_OUTPATIENT_CLINIC_OR_DEPARTMENT_OTHER): Payer: Self-pay

## 2021-06-13 ENCOUNTER — Emergency Department (HOSPITAL_BASED_OUTPATIENT_CLINIC_OR_DEPARTMENT_OTHER)
Admission: EM | Admit: 2021-06-13 | Discharge: 2021-06-13 | Disposition: A | Discharge status: Home / Self Care | Payer: Medicare PPO | Attending: Emergency Medicine | Admitting: Emergency Medicine

## 2021-06-13 ENCOUNTER — Emergency Department (HOSPITAL_BASED_OUTPATIENT_CLINIC_OR_DEPARTMENT_OTHER): Payer: Medicare PPO

## 2021-06-13 ENCOUNTER — Other Ambulatory Visit: Payer: Self-pay

## 2021-06-13 DIAGNOSIS — Z20822 Contact with and (suspected) exposure to covid-19: Secondary | ICD-10-CM | POA: Insufficient documentation

## 2021-06-13 DIAGNOSIS — R0602 Shortness of breath: Secondary | ICD-10-CM | POA: Insufficient documentation

## 2021-06-13 DIAGNOSIS — R531 Weakness: Secondary | ICD-10-CM | POA: Insufficient documentation

## 2021-06-13 DIAGNOSIS — Z8616 Personal history of COVID-19: Secondary | ICD-10-CM | POA: Diagnosis not present

## 2021-06-13 LAB — URINALYSIS, ROUTINE W REFLEX MICROSCOPIC
Bilirubin Urine: NEGATIVE
Glucose, UA: NEGATIVE mg/dL
Hgb urine dipstick: NEGATIVE
Ketones, ur: 15 mg/dL — AB
Nitrite: NEGATIVE
Protein, ur: NEGATIVE mg/dL
Specific Gravity, Urine: 1.013 (ref 1.005–1.030)
Trans Epithel, UA: <1
pH: 6.5 (ref 5.0–8.0)

## 2021-06-13 LAB — CBC
HCT: 40.7 % (ref 36.0–46.0)
Hemoglobin: 13.7 g/dL (ref 12.0–15.0)
MCH: 30.9 pg (ref 26.0–34.0)
MCHC: 33.7 g/dL (ref 30.0–36.0)
MCV: 91.7 fL (ref 80.0–100.0)
Platelets: 218 10*3/uL (ref 150–400)
RBC: 4.44 MIL/uL (ref 3.87–5.11)
RDW: 13.1 % (ref 11.5–15.5)
WBC: 6.9 10*3/uL (ref 4.0–10.5)
nRBC: 0 % (ref 0.0–0.2)

## 2021-06-13 LAB — CBG MONITORING, ED: Glucose-Capillary: 101 mg/dL — ABNORMAL HIGH (ref 70–99)

## 2021-06-13 LAB — COMPREHENSIVE METABOLIC PANEL
ALT: 25 U/L (ref 0–44)
AST: 23 U/L (ref 15–41)
Albumin: 4.3 g/dL (ref 3.5–5.0)
Alkaline Phosphatase: 60 U/L (ref 38–126)
Anion gap: 10 (ref 5–15)
BUN: 22 mg/dL (ref 8–23)
CO2: 21 mmol/L — ABNORMAL LOW (ref 22–32)
Calcium: 9 mg/dL (ref 8.9–10.3)
Chloride: 100 mmol/L (ref 98–111)
Creatinine, Ser: 0.54 mg/dL (ref 0.44–1.00)
GFR, Estimated: >60 mL/min (ref >60)
Glucose, Bld: 122 mg/dL — ABNORMAL HIGH (ref 70–99)
Potassium: 4.1 mmol/L (ref 3.5–5.1)
Sodium: 131 mmol/L — ABNORMAL LOW (ref 135–145)
Total Bilirubin: 0.5 mg/dL (ref 0.3–1.2)
Total Protein: 6.8 g/dL (ref 6.5–8.1)

## 2021-06-13 LAB — MAGNESIUM: Magnesium: 2.2 mg/dL (ref 1.7–2.4)

## 2021-06-13 LAB — TROPONIN I (HIGH SENSITIVITY)
Troponin I (High Sensitivity): 3 ng/L (ref <18)
Troponin I (High Sensitivity): 4 ng/L (ref <18)

## 2021-06-13 LAB — RESP PANEL BY RT-PCR (FLU A&B, COVID) ARPGX2
Influenza A by PCR: NEGATIVE
Influenza B by PCR: NEGATIVE
SARS Coronavirus 2 by RT PCR: NEGATIVE

## 2021-06-13 LAB — BRAIN NATRIURETIC PEPTIDE: B Natriuretic Peptide: 97.8 pg/mL (ref 0.0–100.0)

## 2021-06-13 NOTE — ED Triage Notes (Signed)
Patient here POV from Home with  Patient recently has been having Weakness and SOB.  Patient states she has a Recent History and Admission related to Hyponatremia.  NAD Noted during Triage. A&Ox4. GCS 15. Ambulatory.

## 2021-06-13 NOTE — ED Provider Notes (Signed)
[facility]   CSN: [id number] Arrival date & time: 06/13/21  1656     History  Chief Complaint  Patient presents with   Weakness    Morgan Reese is a 71 y.o. female.  HPI  71 year old female with past medical history of COVID infection with resulting thyroiditis currently on levothyroxine presents the emergency department concern for weakness and anxiety.  Patient states they have been slowly adjusting her levothyroxine dose, recently they had to decrease it because she was having side effects to the higher dose.  She has been struggling with hyponatremia and taking outpatient supplements.  She feels anxious at times which makes her feel short of breath but she denies any chest pain or back pain.  She states that she has been very stressed out over her diagnosis of thyroiditis and the long course of trying to normalize her TSH.  It has been recommended by her endocrinologist that she follows up with psychiatry but she is very hesitant to see a psychiatrist or even consider taking medications.  She is had Xanax prescribed to her but she refuses to take it as well for concern of drug compliance and abuse.  She prefers to take no medication and feels depressed over the fact that she is on multiple prescriptions now.  No recent fever, headache, GI symptoms.  Home Medications Prior to Admission medications   Medication Sig Start Date End Date Taking? Authorizing Provider  diltiazem (TIAZAC) 120 MG 24 hr capsule Take 120 mg by mouth daily.    [provider]  estradiol (ESTRACE) 0.1 MG/GM vaginal cream Place 1 Applicatorful vaginally at bedtime.    [provider]  hydrOXYzine (ATARAX/VISTARIL) 25 MG tablet Take 1 tablet (25 mg total) by mouth every 6 (six) hours as needed for anxiety. 03/01/21   Jacalyn LefevreHaviland, Julie, MD  levothyroxine (SYNTHROID) 50 MCG tablet Take 50 mcg by mouth daily before breakfast.    [provider]   metoprolol tartrate (LOPRESSOR) 25 MG tablet Take 1 tablet (25 mg total) by mouth daily as needed. Patient taking differently: Take 25 mg by mouth 2 (two) times daily. 50 MG TOTAL 01/01/21   Charlynne PanderYao, David Hsienta, MD  progesterone (PROMETRIUM) 200 MG capsule Take 200 mg by mouth daily.    [provider]  pyridOXINE (B-6) 50 MG tablet Take 50 mg by mouth daily.    [provider]  Vitamin D, Ergocalciferol, (DRISDOL) 50000 UNITS CAPS capsule Take 50,000 Units by mouth every 7 (seven) days. Patient not taking: Reported on 04/22/2021    [provider]  zinc gluconate 50 MG tablet Take 50 mg by mouth daily.    [provider]      Allergies    Amoxicillin    Review of Systems   Review of Systems  Constitutional:  Positive for appetite change and fatigue. Negative for fever.  Respiratory:  Negative for shortness of breath.   Cardiovascular:  Negative for chest pain.  Gastrointestinal:  Negative for abdominal pain, diarrhea and vomiting.  Skin:  Negative for rash.  Neurological:  Negative for headaches.  Psychiatric/Behavioral:  Positive for sleep disturbance. Negative for hallucinations, self-injury and suicidal ideas. The patient is nervous/anxious.    Physical Exam Updated Vital Signs BP (!) 150/78    Pulse 74    Temp 98.3 F (36.8 C)    Resp 14    Ht 5\' 1"  (1.549 m)    Wt 61.9 kg    SpO2 99%  BMI 25.78 kg/m  Physical Exam Vitals and nursing note reviewed.  Constitutional:      General: She is not in acute distress.    Appearance: Normal appearance. She is not ill-appearing or diaphoretic.  HENT:     Head: Normocephalic.     Mouth/Throat:     Mouth: Mucous membranes are moist.  Cardiovascular:     Rate and Rhythm: Normal rate.  Pulmonary:     Effort: Pulmonary effort is normal. No respiratory distress.  Abdominal:     Palpations: Abdomen is soft.     Tenderness: There is no abdominal tenderness.  Skin:    General: Skin is warm.   Neurological:     Mental Status: She is alert and oriented to person, place, and time. Mental status is at baseline.  Psychiatric:        Mood and Affect: Mood normal.     Comments: At times tearful when talking about anxiety    ED Results / Procedures / Treatments   Labs (all labs ordered are listed, but only abnormal results are displayed) Labs Reviewed  URINALYSIS, ROUTINE W REFLEX MICROSCOPIC - Abnormal; Notable for the following components:      Result Value   Ketones, ur 15 (*)    Leukocytes,Ua LARGE (*)    Bacteria, UA MANY (*)    All other components within normal limits  COMPREHENSIVE METABOLIC PANEL - Abnormal; Notable for the following components:   Sodium 131 (*)    CO2 21 (*)    Glucose, Bld 122 (*)    All other components within normal limits  CBG MONITORING, ED - Abnormal; Notable for the following components:   Glucose-Capillary 101 (*)    All other components within normal limits  RESP PANEL BY RT-PCR (FLU A&B, COVID) ARPGX2  CBC  MAGNESIUM  BRAIN NATRIURETIC PEPTIDE  TSH  T3, FREE  T4, FREE  TROPONIN I (HIGH SENSITIVITY)  TROPONIN I (HIGH SENSITIVITY)    EKG EKG Interpretation  Date/Time:  Saturday June 13 2021 17:09:10 EST Ventricular Rate:  95 PR Interval:  94 QRS Duration: 86 QT Interval:  308 QTC Calculation: 387 R Axis:   72 Text Interpretation: Sinus rhythm with short PR Nonspecific T wave abnormality Abnormal ECG When compared with ECG of 10-Mar-2021 06:40, PREVIOUS ECG IS PRESENT Similar to previous Confirmed by Coralee Pesa [phone]) on 06/13/2021 6:28:16 PM  Radiology DG Chest Portable 1 View  Result Date: 06/13/2021 CLINICAL DATA:  Weakness, shortness of breath EXAM: PORTABLE CHEST 1 VIEW COMPARISON:  03/01/2021 FINDINGS: Heart and mediastinal contours are within normal limits. No focal opacities or effusions. No acute bony abnormality. IMPRESSION: No active disease. Electronically Signed   By: Charlett Nose M.D.   On: 06/13/2021  20:14    Procedures Procedures    Medications Ordered in ED Medications - No data to display  ED Course/ Medical Decision Making/ A&P                           Medical Decision Making Amount and/or Complexity of Data Reviewed Labs: ordered.   This patient presents to the ED for concern of weakness, anxiety, this involves an extensive number of treatment options, and is a complaint that carries with it a high risk of complications and morbidity.  The differential diagnosis includes electrolyte abnormality, hyper/hypothyroidism, anxiety, depression   Additional history obtained: -Additional history obtained from husband -External records from outside source obtained and reviewed including: Chart review  including previous notes, labs, imaging, consultation notes   Lab Tests: -I ordered, reviewed, and interpreted labs.  The pertinent results include: Baseline hyponatremia at 131, no anemia, 2 negative troponins, normal magnesium, flu and COVID-negative, normal BNP.  Pending TSH, T3 and T4 which will be followed by primary doctor   EKG -Sinus rhythm, nonspecific ST/T wave changes that are similar to previous   Imaging Studies ordered: -I ordered imaging studies including chest x-ray -I independently visualized and interpreted imaging which showed no acute pathology -I agree with the radiologist interpretation   ED Course: 71 year old female presents emergency department concern for weakness and anxiety that sometimes causes shortness of breath.  From a shortness of breath standpoint I do not believe that there is a cardiac or lung etiology for this.  Her ACS work-up is negative, very low suspicion for PE, no symptoms of dissection.  She is otherwise very well-appearing, clear lungs.  No findings of heart failure.  Her thyroid hormones have still been slightly off that they are adjusting her levothyroxine.  We will send off a TSH/T3/T4 that she will follow-up as an outpatient.   Urinalysis is unremarkable.  Hyponatremia is baseline.  Patient at times feels very anxious and tearful.  Is been recommended multiple times that she follows up with psychiatry from her endocrinologist however she is hesitant to do this and scared to take any medications from a psychiatric standpoint.  I believe her current symptoms are most likely secondary to a psychiatric component as well as endocrine etiology with abnormal thyroid function.  I have encouraged her to follow-up as an outpatient with psychiatry and her endocrinologist, I otherwise do not see any emergent need for admission or further testing.  She is otherwise well-appearing and will follow-up as an outpatient.   Cardiac Monitoring: The patient was maintained on a cardiac monitor.  I personally viewed and interpreted the cardiac monitored which showed an underlying rhythm of: Sinus rhythm   Reevaluation: After the interventions noted above, I reevaluated the patient and found that they have :improved   Dispostion: Patient at this time appears safe and stable for discharge and close outpatient follow up. Discharge plan and strict return to ED precautions discussed, patient verbalizes understanding and agreement.        Final Clinical Impression(s) / ED Diagnoses Final diagnoses:  Weakness    Rx / DC Orders ED Discharge Orders     None         Rozelle Logan, DO 06/13/21 2330

## 2021-06-13 NOTE — Discharge Instructions (Signed)
You have been seen and discharged from the emergency department.  A thyroid hormone and T3/T4 has been sent.  This will result in the next 48 hours.  I do believe it would be beneficial to follow-up with a psychiatrist for further evaluation and treatment.  Follow-up with your primary provider for further evaluation and further care. Take home medications as prescribed. If you have any worsening symptoms or further concerns for your health please return to an emergency department for further evaluation.

## 2021-06-14 LAB — TSH: TSH: 3.036 u[IU]/mL (ref 0.350–4.500)

## 2021-06-14 LAB — T4, FREE: Free T4: 1.23 ng/dL — ABNORMAL HIGH (ref 0.61–1.12)

## 2021-06-15 DIAGNOSIS — R300 Dysuria: Secondary | ICD-10-CM | POA: Diagnosis not present

## 2021-06-15 DIAGNOSIS — R350 Frequency of micturition: Secondary | ICD-10-CM | POA: Diagnosis not present

## 2021-06-15 LAB — T3, FREE: T3, Free: 2.1 pg/mL (ref 2.0–4.4)

## 2021-06-16 DIAGNOSIS — R208 Other disturbances of skin sensation: Secondary | ICD-10-CM | POA: Diagnosis not present

## 2021-06-16 DIAGNOSIS — N8110 Cystocele, unspecified: Secondary | ICD-10-CM | POA: Diagnosis not present

## 2021-06-16 DIAGNOSIS — N907 Vulvar cyst: Secondary | ICD-10-CM | POA: Diagnosis not present

## 2021-06-23 DIAGNOSIS — R300 Dysuria: Secondary | ICD-10-CM | POA: Diagnosis not present

## 2021-06-24 DIAGNOSIS — E039 Hypothyroidism, unspecified: Secondary | ICD-10-CM | POA: Diagnosis not present

## 2021-06-25 DIAGNOSIS — E871 Hypo-osmolality and hyponatremia: Secondary | ICD-10-CM | POA: Diagnosis not present

## 2021-06-25 DIAGNOSIS — R251 Tremor, unspecified: Secondary | ICD-10-CM | POA: Diagnosis not present

## 2021-06-26 ENCOUNTER — Other Ambulatory Visit: Payer: Self-pay

## 2021-06-26 ENCOUNTER — Encounter: Payer: Self-pay | Admitting: Adult Health

## 2021-06-26 ENCOUNTER — Ambulatory Visit (INDEPENDENT_AMBULATORY_CARE_PROVIDER_SITE_OTHER): Payer: Medicare PPO | Admitting: Adult Health

## 2021-06-26 VITALS — BP 112/70 | HR 99 | Ht 63.0 in | Wt 132.0 lb

## 2021-06-26 DIAGNOSIS — F418 Other specified anxiety disorders: Secondary | ICD-10-CM | POA: Diagnosis not present

## 2021-06-26 DIAGNOSIS — N8111 Cystocele, midline: Secondary | ICD-10-CM | POA: Insufficient documentation

## 2021-06-26 DIAGNOSIS — N949 Unspecified condition associated with female genital organs and menstrual cycle: Secondary | ICD-10-CM | POA: Insufficient documentation

## 2021-06-26 DIAGNOSIS — F411 Generalized anxiety disorder: Secondary | ICD-10-CM | POA: Insufficient documentation

## 2021-06-26 MED ORDER — ALPRAZOLAM 0.25 MG PO TABS: 0.2500 mg | ORAL_TABLET | Freq: Two times a day (BID) | ORAL | 2 refills | Status: DC | PRN

## 2021-06-26 NOTE — Progress Notes (Signed)
[facility] MD/PA/NP Initial Note  06/26/2021 3:03 PM LUSERO THIBODEAUX  MRN:  [id number]  Chief Complaint:   HPI:  Patient referral for increased anxiety.  Covid infection - thyroiditis - low sodium levels.   Patient noting increased anxiety with recent health issues. Hoping to get resolutions and regain stability with current medical providers.   Describes mood today as "not the best". Denies irritability. Pleasant. Denies tearfulness - "I don't cry anymore".  Mood symptoms - depression - has times of feeling depressed. Stating "I can usually work through things". Feels anxious - sometimes more than others - more so regarding her recent health issues. Has a prescription for Xanax and is taking as needed. Was given Hydroxyzine for anxiety, but it caused severe constipation. Has tried an SSRI which may have been a contributor to the low sodium levels. Patient is concerned about moving forward with further medication with the unresolved metabolic issues. Did not feel comfortable with taking Xanax initially, but has been able to take 1/2 of a mg tablet and get relief from the anxiety. Hoping to be able to get stabilized from a metabolic standpoint, and discontinue use of the Xanax. Stating "it is helpful to help me stay calmer". Husband, a retired Administrator, sports reportedly also agrees that it has helped her. Varying interest and motivation. Taking medications as prescribed.  Energy levels lower today. Active, does not have a regular exercise routine currently - typically does. Enjoys some usual interests and activities. Married - lives with husband. Has 2 adult children. Spending time with family. Has a good friend that she relies on for support.  Appetite adequate. Weight stable. Sleeps well most nights. Averages 6 to 8 hours. Has a few days a week with decreased sleep - a few hours. Denies daytime napping. Able to provide a sleep log. Focus and concentration stable. Completing tasks. Managing aspects of  household. Retired. Denies SI or HI.  Denies AH or VH. Denies paranoia.   Previous medication trials:  Prozac.  Visit Diagnosis:    ICD-10-CM   1. Anxiety about health  F41.8       Past Psychiatric History: Denies psychiatric hospitalization.   Past Medical History:  Past Medical History:  Diagnosis Date   Hypertension    Sinus congestion    Thyroid disease     Past Surgical History:  Procedure Laterality Date   NASAL SINUS SURGERY     TUBAL LIGATION      Family Psychiatric History: Family history of mental illness - mother  Family History:  Family History  Problem Relation Age of Onset   Depression Mother    Cancer Father    Stroke Paternal Uncle    Diabetes Paternal Grandmother     Social History:  Social History   Socioeconomic History   Marital status: Married    Spouse name: Not on file   Number of children: Not on file   Years of education: Not on file   Highest education level: Not on file  Occupational History   Not on file  Tobacco Use   Smoking status: Never   Smokeless tobacco: Never  Vaping Use   Vaping Use: Never used  Substance and Sexual Activity   Alcohol use: Never   Drug use: Never   Sexual activity: Not on file  Other Topics Concern   Not on file  Social History Narrative   Not on file   Social Determinants of Health   Financial Resource Strain: Not on file  Food Insecurity:  Not on file  Transportation Needs: Not on file  Physical Activity: Not on file  Stress: Not on file  Social Connections: Not on file    Allergies:  Allergies  Allergen Reactions   Amoxicillin Swelling    REACTION: Head felt over-heated Other reaction(s): Other (See Comments) REACTION: Head felt over-heated    Serotonin Reuptake Inhibitors (Ssris) Other (See Comments)    Other reaction(s): hyponatremia SSRI's are contraindicated, causes hyponatremia     Metabolic Disorder Labs: No results found for: HGBA1C, MPG No results found for:  PROLACTIN No results found for: CHOL, TRIG, HDL, CHOLHDL, VLDL, LDLCALC Lab Results  Component Value Date   TSH 3.036 06/13/2021   TSH 1.071 03/10/2021    Therapeutic Level Labs: No results found for: LITHIUM No results found for: VALPROATE No components found for:  CBMZ  Current Medications: Current Outpatient Medications  Medication Sig Dispense Refill   ALPRAZolam (XANAX) 0.25 MG tablet Take 1 tablet (0.25 mg total) by mouth 2 (two) times daily as needed for anxiety. 60 tablet 2   diltiazem (TIAZAC) 120 MG 24 hr capsule Take 120 mg by mouth daily.     estradiol (ESTRACE) 0.1 MG/GM vaginal cream Place 1 Applicatorful vaginally at bedtime.     hydrOXYzine (ATARAX/VISTARIL) 25 MG tablet Take 1 tablet (25 mg total) by mouth every 6 (six) hours as needed for anxiety. 20 tablet 0   levothyroxine (SYNTHROID) 50 MCG tablet Take 50 mcg by mouth daily before breakfast.     metoprolol tartrate (LOPRESSOR) 25 MG tablet Take 1 tablet (25 mg total) by mouth daily as needed. (Patient taking differently: Take 25 mg by mouth 2 (two) times daily. 50 MG TOTAL) 10 tablet 0   progesterone (PROMETRIUM) 200 MG capsule Take 200 mg by mouth daily.     pyridOXINE (B-6) 50 MG tablet Take 50 mg by mouth daily.     Vitamin D, Ergocalciferol, (DRISDOL) 50000 UNITS CAPS capsule Take 50,000 Units by mouth every 7 (seven) days. (Patient not taking: Reported on 04/22/2021)     zinc gluconate 50 MG tablet Take 50 mg by mouth daily.     No current facility-administered medications for this visit.    Medication Side Effects: none  Orders placed this visit:  No orders of the defined types were placed in this encounter.   Psychiatric Specialty Exam:  Review of Systems  Musculoskeletal:  Negative for gait problem.  Neurological:  Negative for tremors.  Psychiatric/Behavioral:         Please refer to HPI   Blood pressure 112/70, pulse 99, height 5\' 3"  (1.6 m), weight 132 lb (59.9 kg).Body mass index is 23.38  kg/m.  General Appearance: Casual and Neat  Eye Contact:  Good  Speech:  Clear and Coherent and Normal Rate  Volume:  Normal  Mood:  Euthymic  Affect:  Appropriate and Congruent  Thought Process:  Coherent and Descriptions of Associations: Intact  Orientation:  Full (Time, Place, and Person)  Thought Content: Logical   Suicidal Thoughts:  No  Homicidal Thoughts:  No  Memory:  WNL  Judgement:  Good  Insight:  Good  Psychomotor Activity:  Normal  Concentration:  Concentration: Good  Recall:  Good  Fund of Knowledge: Good  Language: Good  Assets:  Communication Skills Desire for Improvement Financial Resources/Insurance Housing Intimacy Leisure Time Physical Health Resilience Social Support Talents/Skills Transportation Vocational/Educational  ADL's:  Intact  Cognition: WNL  Prognosis:  Good   Screenings:  [facility] from 06/13/2021  in [facility] ED to [facility] (Discharged) from 03/10/2021 in [facility] from 03/01/2021 in [facility]  C-SSRS RISK CATEGORY No Risk No Risk No Risk       Receiving Psychotherapy: No   Treatment Plan/Recommendations:  Plan:  PDMP reviewed  Continue Xanax 0.25mg  BID as needed for anxiety.  Discussed potential benefits, risk, and side effects of benzodiazepines to include potential risk of tolerance and dependence, as well as possible drowsiness. Advised patient not to drive if experiencing drowsiness and to take lowest possible effective dose to minimize risk of dependence and tolerance.   Time spent with patient was 60 minutes. Greater than 50% of face to face time with patient was spent on counseling and coordination of care.    RTC as needed.  Patient advised to contact office with any questions, adverse effects, or acute worsening in signs and symptoms.      [name], NP

## 2021-07-01 DIAGNOSIS — E871 Hypo-osmolality and hyponatremia: Secondary | ICD-10-CM | POA: Diagnosis not present

## 2021-07-01 DIAGNOSIS — E039 Hypothyroidism, unspecified: Secondary | ICD-10-CM | POA: Diagnosis not present

## 2021-07-01 DIAGNOSIS — I10 Essential (primary) hypertension: Secondary | ICD-10-CM | POA: Diagnosis not present

## 2021-07-15 ENCOUNTER — Ambulatory Visit: Payer: Medicare PPO | Admitting: Cardiovascular Disease

## 2021-07-15 DIAGNOSIS — E871 Hypo-osmolality and hyponatremia: Secondary | ICD-10-CM | POA: Diagnosis not present

## 2021-07-17 ENCOUNTER — Ambulatory Visit: Payer: Medicare PPO | Admitting: Adult Health

## 2021-07-17 DIAGNOSIS — R1084 Generalized abdominal pain: Secondary | ICD-10-CM | POA: Diagnosis not present

## 2021-07-17 DIAGNOSIS — F5101 Primary insomnia: Secondary | ICD-10-CM | POA: Diagnosis not present

## 2021-07-17 DIAGNOSIS — R5383 Other fatigue: Secondary | ICD-10-CM | POA: Diagnosis not present

## 2021-07-17 DIAGNOSIS — R197 Diarrhea, unspecified: Secondary | ICD-10-CM | POA: Diagnosis not present

## 2021-07-20 DIAGNOSIS — R197 Diarrhea, unspecified: Secondary | ICD-10-CM | POA: Diagnosis not present

## 2021-07-24 ENCOUNTER — Other Ambulatory Visit: Payer: Self-pay

## 2021-07-24 ENCOUNTER — Ambulatory Visit (INDEPENDENT_AMBULATORY_CARE_PROVIDER_SITE_OTHER): Payer: Medicare PPO | Admitting: Adult Health

## 2021-07-24 ENCOUNTER — Encounter: Payer: Self-pay | Admitting: Adult Health

## 2021-07-24 DIAGNOSIS — F418 Other specified anxiety disorders: Secondary | ICD-10-CM | POA: Diagnosis not present

## 2021-07-24 NOTE — Progress Notes (Signed)
Ralene Muskrat ?[id number] ?December 18, 1950 ?71 y.o. ? ?Subjective:  ? ?Patient ID:  Morgan Reese is a 71 y.o. (DOB [birth year]) female. ? ?Chief Complaint: No chief complaint on file. ? ? ?HPI ?Morgan Reese presents to the office today for follow-up of anxiety disorder. ? ?  ? ?Describes mood today as "ok". Pleasant. Denies tearfulness. Mood symptoms - reports some depression, anxiety, and irritability. Stating "yesterday was a fantastic day, today has been ok". Feels like she over  analyzing things - always trying to figure out the right strategy. 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 Notes her sodium levels are now normalizing - considering medications as an option, but prefers to start with therapy initially. Varying interest and motivation. Taking medications as prescribed.  ?Energy levels lower - "not what it use to be". Active, does not have a regular exercise routine currently. ?Enjoys some usual interests and activities. Married - lives with husband. Has 2 adult children. Spending time with family.  ?Appetite adequate. Weight stable. ?Sleeps well most nights. Averages 6 to 8 hours. Focus and concentration stable. Completing tasks. Managing aspects of household. Retired. ?Denies SI or HI.  ?Denies AH or VH. ?Denies paranoia. ? ? ?Previous medication trials: Prozac ? ? ? ?Flowsheet Row ED from 06/13/2021 in [facility] ED to Hosp-Admission (Discharged) from 03/10/2021 in [facility] ED from 03/01/2021 in [facility]  ?C-SSRS RISK CATEGORY No Risk No Risk No Risk  ? ?  ?  ? ?Review of Systems:  ?Review of Systems  ?Musculoskeletal:  Negative for gait problem.  ?Neurological:  Negative for tremors.  ?Psychiatric/Behavioral:    ?     Please refer to HPI  ? ?Medications: I have reviewed the patient's current  medications. ? ?Current Outpatient Medications  ?Medication Sig Dispense Refill  ? ALPRAZolam (XANAX) 0.25 MG tablet Take 1 tablet (0.25 mg total) by mouth 2 (two) times daily as needed for anxiety. 60 tablet 2  ? diltiazem (TIAZAC) 120 MG 24 hr capsule Take 120 mg by mouth daily.    ? estradiol (ESTRACE) 0.1 MG/GM vaginal cream Place 1 Applicatorful vaginally at bedtime.    ? hydrOXYzine (ATARAX/VISTARIL) 25 MG tablet Take 1 tablet (25 mg total) by mouth every 6 (six) hours as needed for anxiety. 20 tablet 0  ? levothyroxine (SYNTHROID) 50 MCG tablet Take 50 mcg by mouth daily before breakfast.    ? metoprolol tartrate (LOPRESSOR) 25 MG tablet Take 1 tablet (25 mg total) by mouth daily as needed. (Patient taking differently: Take 25 mg by mouth 2 (two) times daily. 50 MG TOTAL) 10 tablet 0  ? progesterone (PROMETRIUM) 200 MG capsule Take 200 mg by mouth daily.    ? pyridOXINE (B-6) 50 MG tablet Take 50 mg by mouth daily.    ? Vitamin D, Ergocalciferol, (DRISDOL) 50000 UNITS CAPS capsule Take 50,000 Units by mouth every 7 (seven) days. (Patient not taking: Reported on 04/22/2021)    ? zinc gluconate 50 MG tablet Take 50 mg by mouth daily.    ? ?No current facility-administered medications for this visit.  ? ? ?Medication Side Effects: None ? ?Allergies:  ?Allergies  ?Allergen Reactions  ? Amoxicillin Swelling  ?  REACTION: Head felt over-heated ?Other reaction(s): Other (See Comments) ?REACTION: Head felt over-heated ?  ? Serotonin Reuptake Inhibitors (Ssris) Other (See Comments)  ?  Other reaction(s): hyponatremia ?SSRI's are contraindicated, causes hyponatremia ?  ? ? ?Past Medical History:  ?Diagnosis Date  ? Hypertension   ? Sinus congestion   ? Thyroid disease   ? ? ?Past Medical History, Surgical history, Social history, and Family history were reviewed and updated as appropriate.  ? ?Please see review of systems for further details on the patient's review from today.  ? ?Objective:  ? ?Physical Exam:  ?There  were no vitals taken for this visit. ? ?Physical Exam ?Constitutional:   ?   General: She is not in acute distress. ?Musculoskeletal:     ?   General: No deformity.  ?Neurological:  ?   Mental Status: She is alert and oriented to person, place, and time.  ?   Coordination: Coordination normal.  ?Psychiatric:     ?   Attention and Perception: Attention and perception normal. She does not perceive auditory or visual hallucinations.     ?   Mood and Affect: Mood normal. Mood is not anxious or depressed. Affect is not labile, blunt, angry or inappropriate.     ?   Speech: Speech normal.     ?   Behavior: Behavior normal.     ?   Thought Content: Thought content normal. Thought content is not paranoid or delusional. Thought content does not include homicidal or suicidal ideation. Thought content does not include homicidal or suicidal plan.     ?   Cognition and Memory: Cognition and memory normal.     ?   Judgment: Judgment normal.  ?   Comments: Insight intact  ? ? ?Lab Review:  ?   ?Component Value Date/Time  ? NA 131 (L) 06/13/2021 1730  ? K 4.1 06/13/2021 1730  ? CL 100 06/13/2021 1730  ? CO2 21 (L) 06/13/2021 1730  ? GLUCOSE 122 (H) 06/13/2021 1730  ? BUN 22 06/13/2021 1730  ? CREATININE 0.54 06/13/2021 1730  ? CALCIUM 9.0 06/13/2021 1730  ? PROT 6.8 06/13/2021 1730  ? ALBUMIN 4.3 06/13/2021 1730  ? AST 23 06/13/2021 1730  ? ALT 25 06/13/2021 1730  ? ALKPHOS 60 06/13/2021 1730  ? BILITOT 0.5 06/13/2021 1730  ? GFRNONAA >60 06/13/2021 1730  ? ? ?   ?Component Value Date/Time  ? WBC 6.9 06/13/2021 1730  ? RBC 4.44 06/13/2021 1730  ? HGB 13.7 06/13/2021 1730  ? HCT 40.7 06/13/2021 1730  ? PLT 218 06/13/2021 1730  ? MCV 91.7 06/13/2021 1730  ? MCH 30.9 06/13/2021 1730  ? MCHC 33.7 06/13/2021 1730  ? RDW 13.1 06/13/2021 1730  ? LYMPHSABS 0.9 03/01/2021 1318  ? MONOABS 0.5 03/01/2021 1318  ? EOSABS 0.0 03/01/2021 1318  ? BASOSABS 0.0 03/01/2021 1318  ? ? ?No results found for: POCLITH, LITHIUM  ? ?No results found for:  PHENYTOIN, PHENOBARB, VALPROATE, CBMZ  ? ?.res ?Assessment: Plan:   ? ?Plan: ? ?PDMP reviewed ? ?Continue Xanax 0.25mg  BID as needed for anxiety. ? ?Discussed potential benefits, risk, and side effects of benzodiazepines to include potential risk of tolerance and dependence, as well as possible drowsiness. Advised patient not to drive if experiencing drowsiness and to take lowest possible effective dose to minimize risk of dependence and tolerance.  ? ?Time spent with patient was 25 minutes. Greater than 50% of face to face time with patient was spent on counseling and coordination of care.   ? ?RTC as needed. ? ?Patient advised to contact office with any questions, adverse effects, or acute worsening in  signs and symptoms. ? ?Diagnoses and all orders for this visit: ? ?Anxiety about health ? ?  ? ?Please see After Visit Summary for patient specific instructions. ? ?Future Appointments  ?Date Time Provider Department Center  ?08/18/2021  1:00 PM Mathis Fare, LCSW [facility] None  ?10/27/2021  2:20 PM Croitoru, Rachelle Hora, MD [facility]  ? ? ?No orders of the defined types were placed in this encounter. ? ? ?------------------------------- ?

## 2021-08-04 DIAGNOSIS — F411 Generalized anxiety disorder: Secondary | ICD-10-CM | POA: Diagnosis not present

## 2021-08-04 DIAGNOSIS — E039 Hypothyroidism, unspecified: Secondary | ICD-10-CM | POA: Diagnosis not present

## 2021-08-04 DIAGNOSIS — E079 Disorder of thyroid, unspecified: Secondary | ICD-10-CM | POA: Diagnosis not present

## 2021-08-04 DIAGNOSIS — E871 Hypo-osmolality and hyponatremia: Secondary | ICD-10-CM | POA: Diagnosis not present

## 2021-08-04 DIAGNOSIS — R5383 Other fatigue: Secondary | ICD-10-CM | POA: Diagnosis not present

## 2021-08-04 DIAGNOSIS — I10 Essential (primary) hypertension: Secondary | ICD-10-CM | POA: Diagnosis not present

## 2021-08-12 DIAGNOSIS — G252 Other specified forms of tremor: Secondary | ICD-10-CM | POA: Diagnosis not present

## 2021-08-12 DIAGNOSIS — Z79899 Other long term (current) drug therapy: Secondary | ICD-10-CM | POA: Diagnosis not present

## 2021-08-12 DIAGNOSIS — R0602 Shortness of breath: Secondary | ICD-10-CM | POA: Diagnosis not present

## 2021-08-12 DIAGNOSIS — F411 Generalized anxiety disorder: Secondary | ICD-10-CM | POA: Diagnosis not present

## 2021-08-12 DIAGNOSIS — E871 Hypo-osmolality and hyponatremia: Secondary | ICD-10-CM | POA: Diagnosis not present

## 2021-08-12 DIAGNOSIS — E039 Hypothyroidism, unspecified: Secondary | ICD-10-CM | POA: Diagnosis not present

## 2021-08-18 ENCOUNTER — Ambulatory Visit: Payer: Medicare PPO | Admitting: Psychiatry

## 2021-08-18 ENCOUNTER — Other Ambulatory Visit: Payer: Self-pay

## 2021-08-18 DIAGNOSIS — F418 Other specified anxiety disorders: Secondary | ICD-10-CM | POA: Diagnosis not present

## 2021-08-18 NOTE — Progress Notes (Signed)
[facility] Counselor Initial Adult Exam ? ?Name: Morgan Reese ?Date: 08/18/2021 ?MRN: [id number] ?DOB: August 14, 1950 ?PCP: Jarrett Soho, PA-C ? ?Time spent: 60 minutes ? ?Guardian/Payee:  patient   ? ?Paperwork requested:  No  ? ?Reason for Visit /Presenting Problem: anxiety, depression ? ?Mental Status Exam: ?  ? ?Appearance:   Neat     ?Behavior:  Appropriate, Sharing, and Motivated  ?Motor:  Some slower walking due to "foot issue"  ?Speech/Language:   Normal Rate  ?Affect:  Anxious, some depression  ?Mood:  anxious and depressed  ?Thought process:  goal directed  ?Thought content:    Obsessions   ?Sensory/Perceptual disturbances:    WNL  ?Orientation:  oriented to person, place, time/date, situation, day of week, month of year, year, and stated date of August 18, 2021  ?Attention:  Good  ?Concentration:  Fair  ?Memory:  Has noticed some short term memory issues  ?Fund of knowledge:   Good  ?Insight:    Good and Fair  ?Judgment:   Good  ?Impulse Control:  Good  ? ?Reported Symptoms:  see above ? ?Risk Assessment: ?Danger to Self:  No ?Self-injurious Behavior: No ?Danger to Others: No ?Duty to Warn:no ?Physical Aggression / Violence:No  ?Access to Firearms a concern: No  ?Gang Involvement:No  ?Patient / guardian was educated about steps to take if suicide or homicide risk level increases between visits: Denies any SI. ?While future psychiatric events cannot be accurately predicted, the patient does not currently require acute inpatient psychiatric care and does not currently meet [facility] involuntary commitment criteria. ? ?Substance Abuse History: ?Current substance abuse: No    ? ?Past Psychiatric History:   ?Previous psychological history is significant for anxiety ?Outpatient Providers:n/a ?History of Psych Hospitalization: No  ?Psychological Testing:  n/a   ? ?Abuse History: ?Victim of No.,  n/a    ?Report needed: No. ?Victim of Neglect:No. ?Perpetrator of  n/a   ?Witness / Exposure to Domestic  Violence:  n/a   ?Protective Services Involvement: No  ?Witness to Community Violence:  No  ? ?Family History: Patient confirms info below. ?Family History  ?Problem Relation Age of Onset  ? Depression Mother   ? Cancer Father   ? Stroke Paternal Uncle   ? Diabetes Paternal Grandmother   ? ? ?Living situation: the patient lives with their spouse. Married since 2003 and this is second marriage after a prior divorce. Current husband is retired Teacher, early years/pre and "as supportive as husbands typically are. 1st husband that she divorced is deceased. Has 2 adult kids , 1 daughter and 1 son living in [place], [place] and has 5 grandchildren. Feels closer to son. Patient retired from Orthoptist at [employer]. Reports mother was Bipolar and father was shizophrenic. ? ?Sexual Orientation:  Straight ? ?Relationship Status: married  ?Name of spouse / other:n/a   ?            If a parent, number of children / ages:2 "in their 43's" ? ?Support Systems; spouse ?friends ? ?Financial Stress:  No  ? 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 ?Military Service: No  ? ?Educational History: ?Education:  Ph.D ? ?Religion/Sprituality/World View:    I've not been exposed to one that is consistent with my beliefs. ? ?Any cultural differences that may affect / interfere with treatment:  not applicable  ? ?Recreation/Hobbies: the park [place]); and enjoy my dogs ? ?Stressors:Health problems   ? ?Strengths:  Supportive Relationships, Family, Friends, Hopefulness, Journalist, newspaper, and Able to [employer]  Effectively ? ?Barriers:  "my health"  ? ?Legal History: ?Pending legal issue / charges: The patient has no significant history of legal issues. ?History of legal issue / charges:  n/a ? ?Medical History/Surgical History:Reviewed with patient and she confirms info below EXCEPT she denies ever having Hypertension ?Past Medical History:  ?Diagnosis Date  ? Hypertension   ? Sinus congestion   ? Thyroid disease   ? ? ?Past Surgical  History:  ?Procedure Laterality Date  ? NASAL SINUS SURGERY    ? TUBAL LIGATION    ? ? ?Medications: Patient confirms info below. ?Current Outpatient Medications  ?Medication Sig Dispense Refill  ? ALPRAZolam (XANAX) 0.25 MG tablet Take 1 tablet (0.25 mg total) by mouth 2 (two) times daily as needed for anxiety. 60 tablet 2  ? diltiazem (TIAZAC) 120 MG 24 hr capsule Take 120 mg by mouth daily.    ? estradiol (ESTRACE) 0.1 MG/GM vaginal cream Place 1 Applicatorful vaginally at bedtime.    ? hydrOXYzine (ATARAX/VISTARIL) 25 MG tablet Take 1 tablet (25 mg total) by mouth every 6 (six) hours as needed for anxiety. 20 tablet 0  ? levothyroxine (SYNTHROID) 50 MCG tablet Take 50 mcg by mouth daily before breakfast.    ? metoprolol tartrate (LOPRESSOR) 25 MG tablet Take 1 tablet (25 mg total) by mouth daily as needed. (Patient taking differently: Take 25 mg by mouth 2 (two) times daily. 50 MG TOTAL) 10 tablet 0  ? progesterone (PROMETRIUM) 200 MG capsule Take 200 mg by mouth daily.    ? pyridOXINE (B-6) 50 MG tablet Take 50 mg by mouth daily.    ? Vitamin D, Ergocalciferol, (DRISDOL) 50000 UNITS CAPS capsule Take 50,000 Units by mouth every 7 (seven) days. (Patient not taking: Reported on 04/22/2021)    ? zinc gluconate 50 MG tablet Take 50 mg by mouth daily.    ? ?No current facility-administered medications for this visit.  ? ? ?Allergies  ?Allergen Reactions  ? Amoxicillin Swelling  ?  REACTION: Head felt over-heated ?Other reaction(s): Other (See Comments) ?REACTION: Head felt over-heated ?  ? Serotonin Reuptake Inhibitors (Ssris) Other (See Comments)  ?  Other reaction(s): hyponatremia ?SSRI's are contraindicated, causes hyponatremia ?  ? ? ?Diagnoses:  ?  ICD-10-CM   ?1. Anxiety about health  F41.8   ?  ? ?Treatment goal plan: ?Patient not signing treatment plan on computer screen due to Covid. ? ?Treatment goals: ?Treatment goals remain on treatment plan as patient works with strategies to achieve her goals. Progress  is assessed each session and noted in "subject" and /or "plan" sections of treatment note. ?Long-term goal: ?Reduce overall level, frequncy, and intensity of the anxiety so that daily functioning is not impaired.  ?Short-term goal: ?Increase understanding of beliefs and messages that produces the worry and anxiety.  Describe current and past experiences with specific fears, prominent worries, and anxiety symptoms including their impact on functioning and attempts to resolve it.  ?Strategies: ?Explore cognitive messages that mediate anxiety response and retrain in adaptive cognitions. ? ? ?Plan of Care:  This is patient's first visit with this therapist and today we completed her initial evaluation for therapy and also her initial treatment goal plan collaboratively.  Patient is a 71 year old female who is referred for therapy by her medication provider.  She is currently married in her second marriage after a prior divorce from a previous husband who is now deceased.  Her more recent marriage was in 2003 and current husband is a retired  pharmacist and as she said is "as supportive as husband's typically are".  Patient has 2 adult kids 1 son and 1 daughter both living in [place].  She has 5 grandchildren.  States that she feels closer to her son.  Patient is retired from Orthoptist at [employer].  Adds that her mother was bipolar and her father was schizophrenic.  Patient is neat in appearance, appropriate and motivated in behavior, somewhat slower walking due to a foot issue, speech and language normal, affect and mood are both anxious with some depression, thought process is goal directed, thought content includes some obsessional thoughts, sensory/perceptual is WNL, well oriented to person/place/time/date/situation/day of week/month/year and stated date of August 18, 2021.  Her attention is good, concentration is fair, reports some short-term memory issues, insight is good at times and "fair at other  times", judgment good and impulse control also good.  She denies any current substance abuse and no significant substance abuse history.  Does have a history of some previous anxiety, some intervention from

## 2021-08-25 DIAGNOSIS — Z8616 Personal history of COVID-19: Secondary | ICD-10-CM | POA: Diagnosis not present

## 2021-08-25 DIAGNOSIS — R2681 Unsteadiness on feet: Secondary | ICD-10-CM | POA: Diagnosis not present

## 2021-08-25 DIAGNOSIS — R251 Tremor, unspecified: Secondary | ICD-10-CM | POA: Diagnosis not present

## 2021-08-25 DIAGNOSIS — E039 Hypothyroidism, unspecified: Secondary | ICD-10-CM | POA: Diagnosis not present

## 2021-09-10 ENCOUNTER — Ambulatory Visit: Payer: Medicare PPO | Admitting: Psychiatry

## 2021-09-10 DIAGNOSIS — F418 Other specified anxiety disorders: Secondary | ICD-10-CM

## 2021-09-10 NOTE — Progress Notes (Signed)
?    [facility] Counselor/Therapist Progress Note ? ?Patient ID: Morgan Reese, MRN: [id number],   ? ?Date: 09/10/2021 ? ?Time Spent: 55 minutes  ? ?Treatment Type: Individual Therapy ? ?Reported Symptoms: anxiety, depression ? ?Mental Status Exam: ? ?Appearance:   Casual     ?Behavior:  Appropriate, Sharing, and Motivated  ?Motor:  Normal  ?Speech/Language:   Clear and Coherent  ?Affect:  Depressed and anxious  ?Mood:  anxious and depressed  ?Thought process:  goal directed  ?Thought content:    Obsessions  ?Sensory/Perceptual disturbances:    WNL  ?Orientation:  oriented to person, place, time/date, situation, day of week, month of year, year, and stated date of September 10, 2021  ?Attention:  Good  ?Concentration:  Good and Fair  ?Memory:  "Some issues of short term memory when really anxious"  ?Fund of knowledge:   Good  ?Insight:    Good and Fair  ?Judgment:   Good  ?Impulse Control:  Good  ? ?Risk Assessment: ?Danger to Self:  No ?Self-injurious Behavior: No ?Danger to Others: No ?Duty to Warn:no ?Physical Aggression / Violence:No  ?Access to Firearms a concern: No  ?Gang Involvement:No  ? ?Subjective: Patient today reporting anxiety and depression mostly related to her health concerns especially her hypothyroid diagnosis. Is on water restriction. Energy less. Obsessive thoughts related to her medical status and she feels there is a lack of coordination in her care at other places. Patient using strategies to help her "thoughts and feelings including painting, meditating, reading, painting, and talking which she is very verbal today sharing a lot about her medical concerns "and I just need to be heard." ? ?Interventions: Solution-Oriented/Positive Psychology, Ego-Supportive, and Insight-Oriented ? ?Treatment goal plan: ?Patient not signing treatment plan on computer screen due to Covid. ?Treatment goals: ?Treatment goals remain on treatment plan as patient works with strategies to achieve her goals. Progress  is assessed each session and noted in "subject" and /or "plan" sections of treatment note. ?Long-term goal: ?Reduce overall level, frequncy, and intensity of the anxiety so that daily functioning is not impaired.  ?Short-term goal: ?Increase understanding of beliefs and messages that produces the worry and anxiety.  Describe current and past experiences with specific fears, prominent worries, and anxiety symptoms including their impact on functioning and attempts to resolve it.  ?Strategies: ?Explore cognitive messages that mediate anxiety response and retrain in adaptive cognitions. ? ?Diagnosis: ?  ICD-10-CM   ?1. Anxiety about health  F41.8   ?  ? ?Plan: Patient today showing active participation and motivation as she followed up from our first Initial Evaluation appt as she wanted to discuss more about her medical history and current medical status as well upcoming medical appts as she worries "in the back of my mind al the time because I feel I have to in order to make sure everything is right and included in my chart. States her primary stressor is her health concerns as noted above and she did very well today processing her concerns and feeling heard and supported/validated.  Shares that her health issues do create a lot of worry for her and she is needing people to talk with and support her during this time.  Gets more anxious when she has not talked through issues, and also leads to more obsessive thinking/overthinking.  She seems to doubt communication amongst medical offices/people and fears that "they might not get my health situation fixed and I will be measurable with the rest of my life,  adding that she just wants to recover and not get worse".  Reports trying to remain positive about her health but also hard not to worry and imagine the worst.  Shares that one of her struggles is also realizing that there "are so many things out of my control about my health and I do worry about it every day".   Encouraged her to remain on all medications prescribed and take them as they are prescribed and report any concerns to her physicians involved in her care.  Also encouraged her involvement in activities which she enjoys that can help her stay focused on some positives, and hopefully decrease her worrying. ? ?Review and progress/challenges noted with patient. ? ?Next appointment within 3 weeks. ? ?This record has been created using [employer].  Chart creation errors have been sought, but may not always have been located and corrected.  Such creation errors do not reflect on the standard of medical care provided. ? ? ?Mathis Fare, LCSW ? ? ? ? ? ? ? ? ? ? ? ? ? ? ? ? ? ? ?

## 2021-09-14 ENCOUNTER — Telehealth: Payer: Self-pay | Admitting: Adult Health

## 2021-09-14 NOTE — Telephone Encounter (Signed)
Patient called back regarding previous message. States that since decreasing her dosage for Xanax she is experiencing withdrawals and its only been about 18 hours. Please rtc to discuss. [phone] or cell [phone] ?

## 2021-09-14 NOTE — Telephone Encounter (Signed)
Pt LVM @9 :06a.  She stated she is taking Xanax.  Barnett Applebaum told her if she wanted to try to reduce the dosage bye 25% she could.  She tried it this weekend and she has been experiencing terrible stomach issues. ? ?Next appt 5/8 ?

## 2021-09-14 NOTE — Telephone Encounter (Signed)
Patient has cut back her Xanax to 12.5 mg TID 2 days ago and now is c/o withdrawal symptoms. I told her she was on a low dose and it was not likely to be causing withdrawals. She c/o 2 days of diarrhea and she has been agitated. She sounds like she has a URI and I wondered if there wasn't a viral syndrome. She said she does have "low thyroid" and doctor is having difficulty trying to regulate her T3.  She had COVID in January and has not completely recovered. She sees her neurologist next week. I asked her why she wanted to wean Xanax and she said she forgot to take it one time and she had more energy. She said you had mentioned Remeron and ? buspirone and she said the Remeron was addictive and she doesn't want to take anything addictive. She has been googling information.  ?

## 2021-09-14 NOTE — Telephone Encounter (Signed)
So what is she wanting to do - does she want a taper schedule? Did she take the 0.25 TID and is now at a half of that 3 times a day? ?

## 2021-09-16 NOTE — Telephone Encounter (Signed)
Patient said she would wait and discuss with Gina at her appt next month.  ?

## 2021-09-18 NOTE — Telephone Encounter (Signed)
Please call patient to see if she can get an earlier appt. Thank you.  ?

## 2021-09-18 NOTE — Telephone Encounter (Signed)
Can we get her set up with an appt?Marland Kitchen

## 2021-09-22 ENCOUNTER — Ambulatory Visit: Payer: Medicare PPO | Admitting: Neurology

## 2021-09-22 ENCOUNTER — Encounter: Payer: Self-pay | Admitting: Neurology

## 2021-09-22 VITALS — BP 97/62 | HR 84 | Ht 62.5 in | Wt 127.0 lb

## 2021-09-22 DIAGNOSIS — G20 Parkinson's disease: Secondary | ICD-10-CM | POA: Diagnosis not present

## 2021-09-22 DIAGNOSIS — R278 Other lack of coordination: Secondary | ICD-10-CM | POA: Diagnosis not present

## 2021-09-22 DIAGNOSIS — R498 Other voice and resonance disorders: Secondary | ICD-10-CM

## 2021-09-22 DIAGNOSIS — E079 Disorder of thyroid, unspecified: Secondary | ICD-10-CM | POA: Diagnosis not present

## 2021-09-22 DIAGNOSIS — F418 Other specified anxiety disorders: Secondary | ICD-10-CM | POA: Diagnosis not present

## 2021-09-22 DIAGNOSIS — R251 Tremor, unspecified: Secondary | ICD-10-CM

## 2021-09-22 DIAGNOSIS — Z8616 Personal history of COVID-19: Secondary | ICD-10-CM | POA: Diagnosis not present

## 2021-09-22 NOTE — Patient Instructions (Addendum)
I think you have signs and symptoms of parkinson's disease, or parkinsonism (a parkinson-like syndrome).  ? ?I would like to proceed with a so called DaT scan: This is a specialized brain scan designed to help with diagnosis of tremor disorders. A radioactive marker gets injected and the uptake is measured in the brain and compared to normal controls and right side is compared to the left, a change in uptake can help with diagnosis of certain tremor disorders. A brain MRI on the other hand is a brain scan that helps look at the brain structure in more detail overall and look for age-related changes, blood vessel related changes and look for stroke and volume loss which we call atrophy.  ? ?As far as your medications are concerned, I would like to hold off on any new medications from my end of things at this point but we may consider symptomatic medication, there are plenty of medications that we can utilize for Parkinson's disease or parkinsonism, we will pick up our discussion after your DaTscan.  It may help [place] make a decision about your diagnosis and also about treatment options for you.  Unfortunately, tremor is a very common symptom and may tie in with anxiety, certain medications, certain medical conditions including thyroid dysfunction, tremors can get worse with stress and dehydration as well as blood sugar fluctuation.  ? ?I would like to see you back after the DaT scan.  ? ?As far as your anxiety, you may benefit from a different medication in the near future.  I would like for you to hold off any new medications so long as it is possible for you, until after you had your DaTscan. ?

## 2021-09-22 NOTE — Progress Notes (Signed)
Subjective:  ?  ?Patient ID: Morgan Reese is a 70 y.o. female. ? ?HPI ? ? ? ?Morgan Age, MD, PhD ?[facility] ?[address] ?[phone] ?[place], [place] [address] ? ?Dear Morgan Reese,  ? ?I saw your patient, Morgan Reese, upon your kind request in my neurologic clinic today for initial consultation of her tremors.  The patient is accompanied by her husband today.  As you know, Morgan Reese is a 71 year old right-handed woman with an underlying medical history hypertension, thyroid disease, anxiety, osteopenia, reflux disease, hyponatremia, history of COVID, and vitamin D deficiency, who reports an approximately 2-year history of hand tremors.  She has noted intermittent tremors affecting both upper extremities, has also noticed other symptoms over the past year, and has had quite a bit of stress and flare of anxiety over health scares.  She had COVID in July 2022 and was admitted to the hospital several times in the past year.  She had tachycardia in August and was diagnosed with thyroid dysfunction.  She was treated for palpitations with metoprolol.  She presented to the emergency room in August 2022 and I reviewed the emergency room records.  She presented to the emergency room in October 2022 with shortness of breath and worsening tremors and anxiety.  She was found to have low sodium at 128 at the time.  She was admitted to the hospital in October 2022 with hyponatremia.  She presented to the emergency room on 06/13/2021 with weakness and anxiety.  She reports that she had side effects with Prozac recently.  She has been on Xanax as needed.  She is no longer on hydroxyzine.  ?I reviewed your office note from3/22/2023, she saw Dr. Radene Ou at the time.  She is on multiple medications and polypharmacy was discussed at the time.  She was on naltrexone per [facility] for tremors.  She had blood work through your office on 06/25/2021 and I reviewed the results.   BMP showed blood sugar 128, creatinine 0.76, sodium 131 which is below normal, potassium 4.4, chloride mildly low at 96, BUN 16.  She had a TSH on 06/13/2021 which was normal at 3.036.  Free T3 was normal at 2.1, free T4 was slightly above normal at 1.23. ? ?She has not fallen recently but has fallen in the past and injured her right ankle.  She has lost fine motor skills and her handwriting is changed.  Her voice is changed over the past year to become much softer and more raspy.  She lives with her husband, she has 2 children from the 4 and 3 stepchildren.  She does not sleep well at night.  She is a non-smoker and does not drink alcohol and no daily caffeine.  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  She has no family history of Parkinson's disease but her maternal grandmother had a facial tremor and her mom had a tremor in her hands, she had to be 3, had heart disease and COPD. ? ?Her Past Medical History Is Significant For: ?Past Medical History:  ?Diagnosis Date  ? Hypertension   ? Sinus congestion   ? Thyroid disease   ? ? ?Her Past Surgical History Is Significant For: ?Past Surgical History:  ?Procedure Laterality Date  ? NASAL SINUS SURGERY    ? TUBAL LIGATION    ? ? ?Her Family History Is Significant For: ?Family History  ?Problem Relation Reese of Onset  ?  Tremor Mother   ? Depression Mother   ? Cancer Father   ? Tremor Maternal Grandmother   ? Diabetes Paternal Grandmother   ? Stroke Paternal Uncle   ? ? ?Her Social History Is Significant For: ?Social History  ? ?Socioeconomic History  ? Marital status: Married  ?  Spouse name: Not on file  ? Number of children: Not on file  ? Years of education: Not on file  ? Highest education level: Not on file  ?Occupational History  ? Not on file  ?Tobacco Use  ? Smoking status: Never  ? Smokeless tobacco: Never  ?Vaping Use  ? Vaping Use: Never used  ?Substance and Sexual Activity  ? Alcohol use: Never  ? Drug  use: Never  ? Sexual activity: Not on file  ?Other Topics Concern  ? Not on file  ?Social History Narrative  ? Right handed  ? Caffeine: none   ? ?Social Determinants of Health  ? ?Financial Resource Strain: Not on file  ?Food Insecurity: Not on file  ?Transportation Needs: Not on file  ?Physical Activity: Not on file  ?Stress: Not on file  ?Social Connections: Not on file  ? ? ?Her Allergies Are:  ?Allergies  ?Allergen Reactions  ? Amoxicillin Swelling  ?  REACTION: Head felt over-heated ?Other reaction(s): Other (See Comments) ?REACTION: Head felt over-heated ?  ? Serotonin Reuptake Inhibitors (Ssris) Other (See Comments)  ?  Other reaction(s): hyponatremia ?SSRI's are contraindicated, causes hyponatremia ?  ?:  ? ?Her Current Medications Are:  ?Outpatient Encounter Medications as of 09/22/2021  ?Medication Sig  ? ALPRAZolam (XANAX) 0.25 MG tablet Take 1 tablet (0.25 mg total) by mouth 2 (two) times daily as needed for anxiety.  ? Cholecalciferol (VITAMIN D3 PO) Take by mouth. Alternates 10,000 units with 5,000 units each day.  ? diltiazem (TIAZAC) 120 MG 24 hr capsule Take 120 mg by mouth daily.  ? estradiol (ESTRACE) 0.1 MG/GM vaginal cream Place 1 Applicatorful vaginally at bedtime.  ? estradiol (VIVELLE-DOT) 0.0375 MG/24HR Place 1 patch onto the skin 2 (two) times a week.  ? levothyroxine (SYNTHROID) 50 MCG tablet Take 50 mcg by mouth daily before breakfast.  ? metoprolol tartrate (LOPRESSOR) 25 MG tablet Take 1 tablet (25 mg total) by mouth daily as needed. (Patient taking differently: Take 25 mg by mouth 2 (two) times daily. 50 MG TOTAL)  ? Naltrexone HCl, Pain, 1.5 MG CAPS Take by mouth 2 (two) times daily.  ? progesterone (PROMETRIUM) 200 MG capsule Take 200 mg by mouth daily.  ? pyridOXINE (B-6) 50 MG tablet Take 50 mg by mouth daily.  ? ramelteon (ROZEREM) 8 MG tablet Take by mouth as needed.  ? Vitamin D, Ergocalciferol, (DRISDOL) 50000 UNITS CAPS capsule Take 50,000 Units by mouth every 7 (seven) days.   ? zinc gluconate 50 MG tablet Take 50 mg by mouth daily.  ? hydrOXYzine (ATARAX/VISTARIL) 25 MG tablet Take 1 tablet (25 mg total) by mouth every 6 (six) hours as needed for anxiety. (Patient not taking: Reported on 09/22/2021)  ? ?No facility-administered encounter medications on file as of 09/22/2021.  ?: ? ? ?Review of Systems:  ?Out of a complete 14 point review of systems, all are reviewed and negative with the exception of these symptoms as listed below: ? ? ?Review of Systems  ?Neurological:   ?     Patient is here with husband for evaluation of tremor in bilateral hands. She reports she had a slight tremor when drinking caffeine  for example, but after she got COVID last summer she found out she had a thyroid disorder. She tried Synthroid 75 mcg but had terrible tremors. She can only tolerate 50 mcg right now. She gets nervous regarding her health and feels jittery.   ? ?Objective:  ?Neurological Exam ? ?Physical Exam ?Physical Examination:  ? ?Vitals:  ? 09/22/21 1447  ?BP: 97/62  ?Pulse: 84  ? ? ?General Examination: The patient is a very pleasant 71 y.o. female in no acute distress. She appears well-developed and well-nourished and well groomed.  Mildly anxious appearing. ? ?HEENT: Normocephalic, atraumatic, pupils are equal, round and reactive to light, extraocular tracking is mildly impaired, mild facial masking noted, mild decrease in eye blink rate, mild nuchal rigidity.  She has no lip, neck or jaw tremor.  No carotid bruits.  Hearing grossly intact, airway examination reveals no significant airway crowding.  Tongue protrudes centrally and palate elevates symmetrically.   ? ?Chest: Clear to auscultation without wheezing, rhonchi or crackles noted. ? ?Heart: S1+S2+0, regular and normal without murmurs, rubs or gallops noted.  ? ?Abdomen: Soft, non-tender and non-distended. ? ?Extremities: There is no pitting edema in the distal lower extremities bilaterally.  Good pedal pulses. ? ?Skin: Warm and dry  without trophic changes noted.  ? ?Musculoskeletal: exam reveals no obvious joint deformities.  ? ?Neurologically:  ?Mental status: The patient is awake, alert and oriented in all 4 spheres. Her immediate and re

## 2021-09-23 ENCOUNTER — Telehealth: Payer: Self-pay | Admitting: Neurology

## 2021-09-23 ENCOUNTER — Telehealth: Payer: Self-pay | Admitting: Adult Health

## 2021-09-23 ENCOUNTER — Encounter: Payer: Self-pay | Admitting: Neurology

## 2021-09-23 NOTE — Telephone Encounter (Signed)
I called pt.  Relayed that her fluid restriction and doing the DAT SCAN was not a problem per Onalee Hua in [employer]. She verbalized understanding.  ?

## 2021-09-23 NOTE — Telephone Encounter (Signed)
[employer] spoke to Rollene Rotunda. ref: [id number] ready to be scheduled sent to nuclear medicine at [facility] ?

## 2021-09-23 NOTE — Telephone Encounter (Signed)
I called spoke to Onalee Hua, thru RN at nurses station in [facility] imaging [phone] that pt is ordered DAT SCAN and is on fluid restriction  of 48-54 oz of fluid per day.  Asked if this would cause problem for pt, Onalee Hua stated NO, for for [facility].   ?

## 2021-09-23 NOTE — Telephone Encounter (Signed)
Patient is going to have a brain scan, does not have scheduled yet. She said she can't make any medication changes before the scan and wants to cancel her appt. I reviewed the concerns that she had last week and she said she will "make due" with things until after the scan. I told her I would cancel this appt and to call back and reschedule when she has had her scan/seen neurologist.  ?

## 2021-09-23 NOTE — Telephone Encounter (Signed)
Patient called in regarding an upcoming appt. States that she has to have a brain scan and was advised that she shouldn't have any med changes before the scan. She inquired if it was still necessary to come to her appt on 5/8. Pls call to discuss [phone](h) [phone](c) ?

## 2021-09-23 NOTE — Telephone Encounter (Signed)
Noted. Ty!

## 2021-09-25 DIAGNOSIS — R2681 Unsteadiness on feet: Secondary | ICD-10-CM | POA: Diagnosis not present

## 2021-09-25 DIAGNOSIS — F419 Anxiety disorder, unspecified: Secondary | ICD-10-CM | POA: Diagnosis not present

## 2021-09-25 DIAGNOSIS — E871 Hypo-osmolality and hyponatremia: Secondary | ICD-10-CM | POA: Diagnosis not present

## 2021-09-25 DIAGNOSIS — G252 Other specified forms of tremor: Secondary | ICD-10-CM | POA: Diagnosis not present

## 2021-09-25 DIAGNOSIS — R590 Localized enlarged lymph nodes: Secondary | ICD-10-CM | POA: Diagnosis not present

## 2021-09-25 DIAGNOSIS — E039 Hypothyroidism, unspecified: Secondary | ICD-10-CM | POA: Diagnosis not present

## 2021-09-28 ENCOUNTER — Telehealth: Payer: Self-pay

## 2021-09-28 ENCOUNTER — Ambulatory Visit: Payer: Medicare PPO | Admitting: Adult Health

## 2021-09-28 ENCOUNTER — Telehealth: Payer: Self-pay | Admitting: Adult Health

## 2021-09-28 NOTE — Telephone Encounter (Signed)
Pt left message concerned about weaning off Xanax. Has procedure coming up with Neurologist and pt wants to know should she continue Xanax until after that apt or continue weaning? Contact # [phone] ?

## 2021-09-28 NOTE — Telephone Encounter (Signed)
Patient has called multiple times in regards to this over the past 1-2 weeks. I have sent Barnett Applebaum a message to address.  ?

## 2021-09-29 NOTE — Telephone Encounter (Signed)
Talked with Almira Coaster and she said Xanax is a low dose. She said if patient wants to wean or if she wants to stay on current dose it is up to patient. Called patient and let her know.  ?

## 2021-10-01 ENCOUNTER — Ambulatory Visit: Payer: Medicare PPO | Admitting: Psychiatry

## 2021-10-01 DIAGNOSIS — F411 Generalized anxiety disorder: Secondary | ICD-10-CM

## 2021-10-01 NOTE — Telephone Encounter (Signed)
Im not sure how long it will take to get results back from a DATSCAN.  She is scheduled Tuesday 10-06-2021.  Call nuclear med? ?

## 2021-10-01 NOTE — Telephone Encounter (Signed)
Patient left a voicemail on my phone wanting to know how long it would take for [place] to get the results for the DaTscan because she is being treated with [facility] and they may want to change things up depending on the results of the DaTscan.. I am not sure how long it takes for you all to get the results back.  ?

## 2021-10-01 NOTE — Telephone Encounter (Signed)
LMVM for pt at home #.  That per [facility] where DATscan is done.  24-48 hours then will go to provider then will call you with results.  Hopefully that answered your questions.  ?
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
## 2021-10-02 DIAGNOSIS — Z79899 Other long term (current) drug therapy: Secondary | ICD-10-CM | POA: Diagnosis not present

## 2021-10-02 DIAGNOSIS — F411 Generalized anxiety disorder: Secondary | ICD-10-CM | POA: Diagnosis not present

## 2021-10-06 ENCOUNTER — Encounter (HOSPITAL_COMMUNITY)
Admission: RE | Admit: 2021-10-06 | Discharge: 2021-10-06 | Disposition: A | Discharge status: Home / Self Care | Payer: Medicare PPO | Source: Ambulatory Visit | Attending: Neurology | Admitting: Neurology

## 2021-10-06 DIAGNOSIS — R278 Other lack of coordination: Secondary | ICD-10-CM

## 2021-10-06 DIAGNOSIS — R251 Tremor, unspecified: Secondary | ICD-10-CM

## 2021-10-06 DIAGNOSIS — R479 Unspecified speech disturbances: Secondary | ICD-10-CM | POA: Diagnosis not present

## 2021-10-06 DIAGNOSIS — E079 Disorder of thyroid, unspecified: Secondary | ICD-10-CM

## 2021-10-06 DIAGNOSIS — Z8616 Personal history of COVID-19: Secondary | ICD-10-CM

## 2021-10-06 DIAGNOSIS — G20 Parkinson's disease: Secondary | ICD-10-CM | POA: Insufficient documentation

## 2021-10-06 DIAGNOSIS — R498 Other voice and resonance disorders: Secondary | ICD-10-CM | POA: Diagnosis not present

## 2021-10-06 DIAGNOSIS — F418 Other specified anxiety disorders: Secondary | ICD-10-CM | POA: Diagnosis not present

## 2021-10-06 DIAGNOSIS — G20C Parkinsonism, unspecified: Secondary | ICD-10-CM

## 2021-10-06 DIAGNOSIS — R4589 Other symptoms and signs involving emotional state: Secondary | ICD-10-CM

## 2021-10-06 MED ORDER — POTASSIUM IODIDE (ANTIDOTE) 130 MG PO TABS
ORAL_TABLET | ORAL | Status: AC
  Filled 2021-10-06: qty 1

## 2021-10-06 MED ORDER — IOFLUPANE I 123 185 MBQ/2.5ML IV SOLN: 3.5000 | Freq: Once | INTRAVENOUS | Status: DC | PRN

## 2021-10-07 ENCOUNTER — Telehealth: Payer: Self-pay | Admitting: *Deleted

## 2021-10-07 NOTE — Telephone Encounter (Signed)
-----   Message from Huston Foley, MD sent at 10/06/2021  4:39 PM EDT ----- ?These call patient regarding the recent nuclear medicine DaT scan result. As discussed, this is a specialized brain scan designed to help with diagnosis of tremor disorders, including parkinsonian disorders. A radioactive marker gets injected and the uptake is measured in the brain and compared to normal controls and right side is compared to the left. A change in uptake can help with diagnosis of certain tremor disorders and narrow down the diagnostic possibilities. The patient's recent scan indicated abnormal (as in lower) uptake as compared to normal uptake pattern indicating an underlying parkinsonian disorder including parkinson's disease. Again, this is not a definitive test for Parkinson's disease and does not distinguish between Parkinson's disease and other, atypical parkinsonism disorders.  ?I recommend a FU appt, so we can discuss possible symptomatic treatment options.  ? ? ?

## 2021-10-07 NOTE — Telephone Encounter (Signed)
Spoke with the patient and discussed her DaTscan results.  The patient understands her scan did indicate abnormal uptake pattern which indicates an underlying parkinsonian disorder including Parkinson's disease but she was advised that this test is not a definitive test for Parkinson's disease and does not distinguish between Parkinson's disease and other atypical parkinsonian disorders.  The patient's questions were answered to the best of my ability but she has further questions that need to be discussed with the provider at her follow-up.  I was able to schedule her for next Tuesday, May 23 at 8:45 AM arrival 8:15 AM.  Patient did ask what Dr. Frances Furbish thought about CBD.  She states one of her primary doctors in [place] recommended this.  I did advise CBD is not regulated by the FDA we normally do not recommend it but I would ask.  The patient states she may use this if it helps in the meantime.  She also asked if she needed to seek treatment for depression as she knows this can be associated with PD.  She takes Xanax as needed for anxiety.  She does not want to take too much because she knows it can be addictive.  She also has concerns about her left foot drop. I told her a message would be sent to Dr Frances Furbish and would address the rest of her questions at the office visit. Pt certainly can call back if needed before office visit. Pt was very appreciative for the call. ?

## 2021-10-07 NOTE — Telephone Encounter (Signed)
I recommend she talk to her PCP about anxiety and depression management and her next options.  ?She may need to see a spine specialist for foot drop. If she has back pain, she can talk to PCP about getting an MRI of the lower back/lumbar spine. ?I usually don't recommend CBD. If she wants to try it, it is up to her.  ?

## 2021-10-08 ENCOUNTER — Ambulatory Visit: Payer: Medicare PPO | Admitting: Adult Health

## 2021-10-09 ENCOUNTER — Ambulatory Visit: Payer: Medicare PPO | Admitting: Adult Health

## 2021-10-13 ENCOUNTER — Encounter: Payer: Self-pay | Admitting: Neurology

## 2021-10-13 ENCOUNTER — Ambulatory Visit: Payer: Medicare PPO | Admitting: Neurology

## 2021-10-13 VITALS — BP 92/59 | HR 76 | Ht 63.0 in | Wt 126.4 lb

## 2021-10-13 DIAGNOSIS — F418 Other specified anxiety disorders: Secondary | ICD-10-CM

## 2021-10-13 DIAGNOSIS — G20 Parkinson's disease: Secondary | ICD-10-CM

## 2021-10-13 DIAGNOSIS — K5900 Constipation, unspecified: Secondary | ICD-10-CM

## 2021-10-13 MED ORDER — CARBIDOPA-LEVODOPA 25-100 MG PO TABS: 0.5000 | ORAL_TABLET | Freq: Three times a day (TID) | ORAL | 3 refills | Status: DC

## 2021-10-13 NOTE — Patient Instructions (Addendum)
It was nice to see you both again today.   As discussed, your recent DaTscan supports an underlying parkinsonian syndrome, you may have  Parkinson's disease affecting primarily your left side. I would like to start you on medication for Parkinson's disease, namely Sinemet (generic name: carbidopa-levodopa) 25/100 mg: Take half a pill (1/2 pill) once daily at 8 AM for 1 week, then half a pill twice daily (8 AM and noon) for one week, then half a pill (1/2 pill) 3 times a day (8 AM, noon, and 4 PM) thereafter.  We may increase this medication over time.  Please try to take the medication away from you mealtimes, that is, ideally either one hour before or 2 hours after your meal to ensure optimal absorption. The medication can interfere with the protein content of your meal and trying to the protein in your food and therefore not get fully absorbed.  Common side effects reported are: Nausea, vomiting, sedation, confusion, lightheadedness. Rare side effects include hallucinations, severe nausea or vomiting, diarrhea and significant drop in blood pressure especially when going from lying to standing or from sitting to standing.  We may consider a referral to neuro rehab at our next appointment.

## 2021-10-13 NOTE — Progress Notes (Signed)
Subjective:    Patient ID: Morgan Reese is a 71 y.o. female.  HPI    Interim history:   Ms. Morgan Reese is a 71 year old right-handed woman with an underlying medical history hypertension, thyroid disease, anxiety, osteopenia, reflux disease, hyponatremia, history of COVID, and vitamin D deficiency, who presents for follow-up consultation of her parkinsonism after an interim DaTscan.  The patient is accompanied by her husband again today.  I recently met her on 09/22/2021 at the request of her primary care nurse practitioner, at which time she reported an approximately 2-year history of hand tremors.  She had anxiety and stress, she had been hospitalized several times in 2022.  She was diagnosed with thyroid dysfunction and was treated for palpitations.  She had hospitalization for hyponatremia.  She was also followed by [facility] and was treated with naltrexone for tremors.  Examination findings suggested parkinsonian changes.  She was advised to proceed with a DaTscan.  She had a DaTscan on 10/06/2021 and I reviewed the results: IMPRESSION: Asymmetric decreased radiotracer activity in the posterior RIGHT putamen. This pattern could be associated with early Parkinsonian syndrome pathology. Recommend clinical correlation.   Of note, DaTSCAN is not diagnostic of Parkinsonian syndromes, which remains a clinical diagnosis. DaTscan is an adjuvant test to aid in the clinical diagnosis of Parkinsonian syndromes.  She was notified of the test results by phone call.  Today, 10/13/2021: She reports feeling about the same.  She has occasional constipation, magnesium oxide or magnesium citrate helps.  She sometimes forgets to drink water, since she was hospitalized for hyponatremia and they restricted her fluid intake to about 6 or 7 cups of water, she sometimes is afraid to drink too much.  She has not had any recent falls.  She wears a brace around the left ankle.  She is interested in  physical therapy, she is agreeable to starting medication for Parkinson's disease.  She is wondering if she can take an antidepressant eventually.  She reports that she cannot take the traditional SSRI type.  She started a vitamin K supplement through [facility].  The patient's allergies, current medications, family history, past medical history, past social history, past surgical history and problem list were reviewed and updated as appropriate.   Previously:   09/22/21: (She) reports an approximately 2-year history of hand tremors.  She has noted intermittent tremors affecting both upper extremities, has also noticed other symptoms over the past year, and has had quite a bit of stress and flare of anxiety over health scares.  She had COVID in July 2022 and was admitted to the hospital several times in the past year.  She had tachycardia in August and was diagnosed with thyroid dysfunction.  She was treated for palpitations with metoprolol.  She presented to the emergency room in August 2022 and I reviewed the emergency room records.  She presented to the emergency room in October 2022 with shortness of breath and worsening tremors and anxiety.  She was found to have low sodium at 128 at the time.  She was admitted to the hospital in October 2022 with hyponatremia.  She presented to the emergency room on 06/13/2021 with weakness and anxiety.  She reports that she had side effects with Prozac recently.  She has been on Xanax as needed.  She is no longer on hydroxyzine.  I reviewed your office note from3/22/2023, she saw Dr. Radene Ou at the time.  She is on multiple medications and polypharmacy was discussed at  the time.  She was on naltrexone per [facility] for tremors.  She had blood work through your office on 06/25/2021 and I reviewed the results.  BMP showed blood sugar 128, creatinine 0.76, sodium 131 which is below normal, potassium 4.4, chloride mildly low at 96, BUN 16.   She had a TSH on 06/13/2021 which was normal at 3.036.  Free T3 was normal at 2.1, free T4 was slightly above normal at 1.23.   She has not fallen recently but has fallen in the past and injured her right ankle.  She has lost fine motor skills and her handwriting is changed.  Her voice is changed over the past year to become much softer and more raspy.  She lives with her husband, she has 2 children from her previous marriage, and 3 stepchildren.  She does not sleep well at night.  She is a non-smoker and does not drink alcohol and no daily caffeine.  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  She has no family history of Parkinson's disease but her maternal grandmother had a facial tremor and her mom had a tremor in her hands, she had to be 64, had heart disease and COPD.  Her Past Medical History Is Significant For: Past Medical History:  Diagnosis Date   Hypertension    Sinus congestion    Thyroid disease     Her Past Surgical History Is Significant For: Past Surgical History:  Procedure Laterality Date   NASAL SINUS SURGERY     TUBAL LIGATION      Her Family History Is Significant For: Family History  Problem Relation Age of Onset   Tremor Mother    Depression Mother    Cancer Father    Stroke Paternal Uncle    Tremor Maternal Grandmother    Diabetes Paternal Grandmother    Parkinson's disease Neg Hx     Her Social History Is Significant For: Social History   Socioeconomic History   Marital status: Married    Spouse name: Not on file   Number of children: Not on file   Years of education: Not on file   Highest education level: Not on file  Occupational History   Not on file  Tobacco Use   Smoking status: Never   Smokeless tobacco: Never  Vaping Use   Vaping Use: Never used  Substance and Sexual Activity   Alcohol use: Never   Drug use: Never   Sexual activity: Not on file  Other Topics Concern   Not on file   Social History Narrative   Right handed   Caffeine: none    Social Determinants of Health   Financial Resource Strain: Not on file  Food Insecurity: Not on file  Transportation Needs: Not on file  Physical Activity: Not on file  Stress: Not on file  Social Connections: Not on file    Her Allergies Are:  Allergies  Allergen Reactions   Amoxicillin Swelling    REACTION: Head felt over-heated Other reaction(s): Other (See Comments) REACTION: Head felt over-heated    Serotonin Reuptake Inhibitors (Ssris) Other (See Comments)    Other reaction(s): hyponatremia SSRI's are contraindicated, causes hyponatremia   :   Her Current Medications Are:  Outpatient Encounter Medications as of 10/13/2021  Medication Sig   ALPRAZolam (XANAX) 0.25 MG tablet Take 1 tablet (0.25 mg total) by mouth 2 (two) times daily as needed for anxiety.   Ascorbic Acid (VITAMIN  C) 1000 MG tablet Take 1,000 mg by mouth daily.   Cholecalciferol (VITAMIN D3 PO) Take by mouth. Alternates 10,000 units with 5,000 units each day.   diltiazem (TIAZAC) 120 MG 24 hr capsule Take 120 mg by mouth daily.   estradiol (ESTRACE) 0.1 MG/GM vaginal cream Place 1 Applicatorful vaginally at bedtime.   estradiol (VIVELLE-DOT) 0.0375 MG/24HR Place 1 patch onto the skin 2 (two) times a week.   levothyroxine (SYNTHROID) 50 MCG tablet Take 50 mcg by mouth daily before breakfast.   Magnesium Citrate 200 MG TABS See admin instructions.   melatonin 3 MG TABS tablet Take 6-9 mg by mouth at bedtime.   metoprolol tartrate (LOPRESSOR) 25 MG tablet Take 1 tablet (25 mg total) by mouth daily as needed. (Patient taking differently: Take 25 mg by mouth 2 (two) times daily. 50 MG TOTAL)   Naltrexone HCl, Pain, 1.5 MG CAPS Take by mouth 2 (two) times daily.   Omega-3 Fatty Acids (FISH OIL) 1000 MG CAPS Take 1 capsule by mouth at bedtime.   progesterone (PROMETRIUM) 200 MG capsule Take 200 mg by mouth daily.   pyridOXINE (B-6) 50 MG tablet Take  50 mg by mouth daily.   ramelteon (ROZEREM) 8 MG tablet Take by mouth as needed.   thiamine 100 MG tablet Take 100 mg by mouth daily.   Vitamin D, Ergocalciferol, (DRISDOL) 50000 UNITS CAPS capsule Take 50,000 Units by mouth every 7 (seven) days.   VITAMIN K PO Take by mouth daily. MK7   zinc gluconate 50 MG tablet Take 50 mg by mouth daily.   hydrOXYzine (ATARAX/VISTARIL) 25 MG tablet Take 1 tablet (25 mg total) by mouth every 6 (six) hours as needed for anxiety.   No facility-administered encounter medications on file as of 10/13/2021.  :  Review of Systems:  Out of a complete 14 point review of systems, all are reviewed and negative with the exception of these symptoms as listed below:       Review of Systems  Neurological:        Pt is here to discuss DATSCAN results   Objective:  Neurological Exam  Physical Exam Physical Examination:   Vitals:   10/13/21 0827  BP: (!) 92/59  Pulse: 76    General Examination: The patient is a very pleasant 71 y.o. female in no acute distress. She appears well-developed and well-nourished and well groomed.  Mildly anxious appearing.  HEENT: Normocephalic, atraumatic, pupils are equal, round and reactive to light, extraocular tracking is mildly impaired, mild facial masking noted, mild decrease in eye blink rate, mild nuchal rigidity, all stable.  She has no lip, neck or jaw tremor.  No carotid bruits.  Hearing grossly intact, airway examination reveals no significant airway crowding.  Tongue protrudes centrally and palate elevates symmetrically.     Chest: Clear to auscultation without wheezing, rhonchi or crackles noted.   Heart: S1+S2+0, regular and normal without murmurs, rubs or gallops noted.    Abdomen: Soft, non-tender and non-distended.  Normal bowel sounds.   Extremities: There is no pitting edema in the distal lower extremities bilaterally.   Skin: Warm and dry without trophic changes noted.    Musculoskeletal: exam  reveals no obvious joint deformities.  Velcro brace around left ankle.   Neurologically:  Mental status: The patient is awake, alert and oriented in all 4 spheres. Her immediate and remote memory, attention, language skills and fund of knowledge are appropriate. There is no evidence of aphasia, agnosia, apraxia or anomia.  Speech with mild hypophonia, no dysarthria. Thought process is linear. Mood is normal and affect is normal, mildly anxious.  Cranial nerves II - XII are as described above under HEENT exam.  Motor exam: Normal bulk, strength and mild increase in tone in the left upper extremity with mild cogwheeling noted, she has an intermittent slight resting tremor in the left upper extremity with a mild postural component.  She has a very slight postural tremor in the right upper extremity, no resting tremor in the lower extremities or right upper extremity.    (On 09/22/2021: On Archimedes spiral drawing he has mild trembling with the left hand, minimal trembling with the right hand, handwriting with the right hand is legible, on the smaller side, slightly tremulous.)  Fine motor skills and coordination: Mild to moderate difficulty with finger taps and hand movements on the right, good rapid alternating patting and mild difficulty with foot taps on the right.  Moderate difficulty with finger taps and hand movements on the left with moderate difficulty with foot taps on the left. Decrement in amplitude noted with finger taps and foot taps bilaterally. Cerebellar testing: No dysmetria or intention tremor. There is no truncal or gait ataxia.  Sensory exam: intact to light touch in the upper and lower extremities.  Gait, station and balance: She stands without difficulty, posture is slightly stooped for age.  She walks with decreased stride length and decreased pace, does not pick up her left foot very well and has decrease in arm swing on the left.  No walking aids, balance fairly well-preserved but  walks cautiously.   Assessment and Plan:  In summary, Morgan Reese is a very pleasant 71 year old female with an underlying medical history hypertension, thyroid disease, anxiety, osteopenia, reflux disease, hyponatremia, history of COVID, and vitamin D deficiency, who presents for follow-up consultation of her parkinsonism of approximately 2 years duration.  She has developed a tremor, has experienced changes in her voice and her handwriting and fine motor skills.  She also had more complications with her health in general, was hospitalized several times in 2022.  She had hyponatremia, she had increase in anxiety and history of COVID in 2022, was diagnosed with thyroid disease as well.  History and examination are in keeping with parkinsonism with left-sided predominance/lateralization noted.  She may have left-sided predominant Parkinson's disease.  Her DaTscan from 10/06/2021 showed asymmetric decreased radiotracer activity in the posterior right putamen and, as such, is supportive of an underlying parkinsonian syndrome.  She is at this point advised to start medication in the form of generic Sinemet 25-100 mg strength starting with half a pill.  I am little concerned about her low blood pressure and I suggested we start cautiously.  She is advised to start levodopa once daily for 1 week then increase to half a pill twice daily for 1 week and then increase eventually to half a pill 3 times daily thereafter for now.  We talked about expectations and common side effects.  She was given instructions in writing as well and a new prescription electronically.  She is interested in participating in rehab, we may make a referral after our next visit, I would like for her to be evaluated at neuro rehab with PT, OT and ST and see if she would benefit from therapy after the evaluations.  We talked about the importance of good hydration, and maintaining a healthy lifestyle including good nutrition, a balanced diet,  staying active cognitively as well.  She is advised to follow-up in about 3 months, sooner if needed and encouraged to call us or email through [place] in the interim for any questions or concerns.  I answered all the questions today and the patient and her husband were in agreement. I spent 40 minutes in total face-to-face time and in reviewing records during pre-charting, more than 50% of which was spent in counseling and coordination of care, reviewing test results, reviewing medications and treatment regimen and/or in discussing or reviewing the diagnosis of parkinsonism, the prognosis and treatment options. Pertinent laboratory and imaging test results that were available during this visit with the patient were reviewed by me and considered in my medical decision making (see chart for details).

## 2021-10-14 ENCOUNTER — Telehealth: Payer: Self-pay | Admitting: Neurology

## 2021-10-14 NOTE — Telephone Encounter (Signed)
Pt called to report that she took the carbidopa-levodopa (SINEMET IR) 25-100 MG tablet, one yesterday and had a really bad reaction her BP last night was 148/eighty something.  Pt states when it was wearing off she felt a lot of anxiety, please call.

## 2021-10-14 NOTE — Telephone Encounter (Signed)
Pt sent detailed mychart message. Will address there.

## 2021-10-15 ENCOUNTER — Ambulatory Visit: Payer: Medicare PPO | Admitting: Neurology

## 2021-10-22 ENCOUNTER — Ambulatory Visit: Payer: Medicare PPO | Admitting: Psychiatry

## 2021-10-22 DIAGNOSIS — F411 Generalized anxiety disorder: Secondary | ICD-10-CM

## 2021-10-22 NOTE — Progress Notes (Signed)
[facility] Counselor/Therapist Progress Note  Patient ID: ELLIOTTE MARSALIS, MRN: [id number],    Date: 10/22/2021  Time Spent: 55 minutes   Treatment Type: Individual Therapy  Reported Symptoms:  anxiety, depression, just "learned that I was diagnosed with Parkinsonian syndrome".   Mental Status Exam:  Appearance:   Neat     Behavior:  Appropriate, Sharing, and Motivated  Motor:  Walking with cane  Speech/Language:   Clear and Coherent  Affect:  Anxious, depressed  Mood:  anxious and depressed  Thought process:  goal directed  Thought content:    overthinking  Sensory/Perceptual disturbances:    WNL  Orientation:  oriented to person, place, time/date, situation, day of week, month of year, year, and stated date of October 22, 2021  Attention:  Good  Concentration:  Good and Fair  Memory:  WNL  Fund of knowledge:   Good  Insight:    Good  Judgment:   Good  Impulse Control:  Good   Risk Assessment: Danger to Self:  No Self-injurious Behavior: No Danger to Others: No Duty to Warn:no Physical Aggression / Violence:No  Access to Firearms a concern: No  Gang Involvement:No   Subjective: Patient today reporting her test results did show she has Parkinsonian Syndrome. Expressing concerns today about being diagnosed with Parkinsonian syndrome. Lots of issues about the meds that have been recommended for her. Needed today's session to process her anxiety, doubts, frustrations mostly related to her Parkinsonian diagnosis and difficulty she reports with various med providers, which seemed to be helpful to her and was calmer by session end.   Interventions: Solution-Oriented/Positive Psychology, Ego-Supportive, and Insight-Oriented    Treatment goal plan: Patient not signing treatment plan on computer screen due to Covid. Treatment goals: Treatment goals remain on treatment plan as patient works with strategies to achieve her goals. Progress is assessed each session and noted in  "subject" and /or "plan" sections of treatment note. Long-term goal: Reduce overall level, frequncy, and intensity of the anxiety so that daily functioning is not impaired.  Short-term goal: Increase understanding of beliefs and messages that produces the worry and anxiety.  Describe current and past experiences with specific fears, prominent worries, and anxiety symptoms including their impact on functioning and attempts to resolve it.  Strategies: Explore cognitive messages that mediate anxiety response and retrain in adaptive cognitions.   Diagnosis:No diagnosis found.  Plan: Patient today showing active participation and motivated to talk about her recent diagnosis of Parkinsonian syndrome.  Discussed some of her fears and anxieties as well as frustrations with medication concerns or questions.  Encouraged her to follow through with her physicians on the medication concerns or questions, but that her process some of those concerns today as she needed to feel heard.  After her recent diagnosis, I think that did increase her anxiety some and she states she is scheduled to return within the next 1 to 2 weeks here for a med appt with her medication provider.  Patient really needed session today to be listened to without interrupting and experience some empathy and concern for her within the therapeutic environment.  Understandably has questions and sometimes second-guesses certain things but I think she still working on accepting this latest diagnosis but also able to show some energy and interest in finding out what is going to help her in terms of medications.  States that she has a follow-up with her neurologist also and will learn more at that point. Encouraged patient in  her practice of positive behaviors including: Staying in the present and focusing on what she can change, getting outside some daily, being more careful as she walks to prevent falling, staying in touch with people who are  supportive, trying not to look ahead and imagine worst case scenarios, healthy nutrition and exercise, positive self talk, reduce overthinking and over analyzing, allow her faith to be an emotional support as well as spiritual support, and recognize the strength she shows as she works with goal-directed behaviors to move in a direction that supports her improved emotional health and overall outlook.  Goal review and progress/challenges noted with patient.  Next appointment within approximately 3 weeks based on patient scheduling.  This record has been created using [employer].  Chart creation errors have been sought, but may not always have been located and corrected.  Such creation errors do not reflect on the standard of medical care provided.   Mathis Fare, LCSW

## 2021-10-27 ENCOUNTER — Ambulatory Visit: Payer: Medicare PPO | Admitting: Cardiovascular Disease

## 2021-11-02 ENCOUNTER — Telehealth: Payer: Self-pay | Admitting: Neurology

## 2021-11-02 ENCOUNTER — Ambulatory Visit: Payer: Medicare PPO | Admitting: Adult Health

## 2021-11-02 ENCOUNTER — Encounter: Payer: Self-pay | Admitting: Adult Health

## 2021-11-02 DIAGNOSIS — F411 Generalized anxiety disorder: Secondary | ICD-10-CM

## 2021-11-02 MED ORDER — ALPRAZOLAM 0.25 MG PO TABS: 0.2500 mg | ORAL_TABLET | Freq: Three times a day (TID) | ORAL | 2 refills | Status: DC | PRN

## 2021-11-02 NOTE — Progress Notes (Signed)
Morgan Reese [id number] [birth year] 71 y.o.  Subjective:   Patient ID:  Morgan Reese is a 71 y.o. (DOB 1950/12/10) female.  Chief Complaint: No chief complaint on file.   HPI Morgan Reese presents to the office today for follow-up of anxiety disorder.    Describes mood today as "concerned". Pleasant. Denies tearfulness. Mood symptoms - reports some depression. Reports episodes of irritability. Reports increased anxiety. Concerned about taking an anti-depressant with low sodium levels. Considering other options, but is concerned about potential side effects. Discussed Remeron previously, but is uncertain about the possible drowsiness and interactions with the Xanax. Interested in trying Wellbutrin for the depression - will speak with GP first. Recently diagnosed with Parkinson's Disease - has tried one dose Carbo-Dopa Levo-Dopa and did not tolerate it. Concerned about her future with the diagnosis. Varying interest and motivation. Taking medications as prescribed.  Energy levels lower. Active, has a regular exercise routine currently. Walking at the park. Working with a Psychologist, educational twice a week. Enjoys some usual interests and activities. Married - lives with husband. Has 2 adult children. Spending time with family.  Appetite adequate. Weight loss - 123 pounds. Sleeps well most nights. Averages 6 to 8 hours of broken sleep. Focus and concentration stable. Completing tasks. Managing aspects of household. Retired. Denies SI or HI.  Denies AH or VH. Denies paranoia.   Previous medication trials: Prozac   Flowsheet Row ED from 06/13/2021 in [facility] ED to Hosp-Admission (Discharged) from 03/10/2021 in [facility] ED from 03/01/2021 in [facility]  C-SSRS RISK CATEGORY No Risk No Risk No Risk        Review of Systems:  Review of Systems  Musculoskeletal:  Negative for gait problem.   Neurological:  Negative for tremors.  Psychiatric/Behavioral:         Please refer to HPI    Medications: I have reviewed the patient's current medications.  Current Outpatient Medications  Medication Sig Dispense Refill   ALPRAZolam (XANAX) 0.25 MG tablet Take 1 tablet (0.25 mg total) by mouth 3 (three) times daily as needed for anxiety. 90 tablet 2   Ascorbic Acid (VITAMIN C) 1000 MG tablet Take 1,000 mg by mouth daily.     carbidopa-levodopa (SINEMET IR) 25-100 MG tablet Take 0.5 tablets by mouth 3 (three) times daily. 45 tablet 3   Cholecalciferol (VITAMIN D3 PO) Take by mouth. Alternates 10,000 units with 5,000 units each day.     diltiazem (TIAZAC) 120 MG 24 hr capsule Take 120 mg by mouth daily.     estradiol (ESTRACE) 0.1 MG/GM vaginal cream Place 1 Applicatorful vaginally at bedtime.     estradiol (VIVELLE-DOT) 0.0375 MG/24HR Place 1 patch onto the skin 2 (two) times a week.     hydrOXYzine (ATARAX/VISTARIL) 25 MG tablet Take 1 tablet (25 mg total) by mouth every 6 (six) hours as needed for anxiety. 20 tablet 0   levothyroxine (SYNTHROID) 50 MCG tablet Take 50 mcg by mouth daily before breakfast.     Magnesium Citrate 200 MG TABS See admin instructions.     melatonin 3 MG TABS tablet Take 6-9 mg by mouth at bedtime.     metoprolol tartrate (LOPRESSOR) 25 MG tablet Take 1 tablet (25 mg total) by mouth daily as needed. (Patient taking differently: Take 25 mg by mouth 2 (two) times daily. 50 MG TOTAL) 10 tablet 0   Naltrexone HCl, Pain, 1.5 MG CAPS Take by mouth  2 (two) times daily.     Omega-3 Fatty Acids (FISH OIL) 1000 MG CAPS Take 1 capsule by mouth at bedtime.     progesterone (PROMETRIUM) 200 MG capsule Take 200 mg by mouth daily.     pyridOXINE (B-6) 50 MG tablet Take 50 mg by mouth daily.     ramelteon (ROZEREM) 8 MG tablet Take by mouth as needed.     thiamine 100 MG tablet Take 100 mg by mouth daily.     Vitamin D, Ergocalciferol, (DRISDOL) 50000 UNITS CAPS capsule Take  50,000 Units by mouth every 7 (seven) days.     VITAMIN K PO Take by mouth daily. MK7     zinc gluconate 50 MG tablet Take 50 mg by mouth daily.     No current facility-administered medications for this visit.    Medication Side Effects: None  Allergies:  Allergies  Allergen Reactions   Amoxicillin Swelling    REACTION: Head felt over-heated Other reaction(s): Other (See Comments) REACTION: Head felt over-heated    Serotonin Reuptake Inhibitors (Ssris) Other (See Comments)    Other reaction(s): hyponatremia SSRI's are contraindicated, causes hyponatremia     Past Medical History:  Diagnosis Date   Hypertension    Sinus congestion    Thyroid disease     Past Medical History, Surgical history, Social history, and Family history were reviewed and updated as appropriate.   Please see review of systems for further details on the patient's review from today.   Objective:   Physical Exam:  There were no vitals taken for this visit.  Physical Exam Constitutional:      General: She is not in acute distress. Musculoskeletal:        General: No deformity.  Neurological:     Mental Status: She is alert and oriented to person, place, and time.     Coordination: Coordination normal.  Psychiatric:        Attention and Perception: Attention and perception normal. She does not perceive auditory or visual hallucinations.        Mood and Affect: Mood normal. Mood is not anxious or depressed. Affect is not labile, blunt, angry or inappropriate.        Speech: Speech normal.        Behavior: Behavior normal.        Thought Content: Thought content normal. Thought content is not paranoid or delusional. Thought content does not include homicidal or suicidal ideation. Thought content does not include homicidal or suicidal plan.        Cognition and Memory: Cognition and memory normal.        Judgment: Judgment normal.     Comments: Insight intact     Lab Review:     Component  Value Date/Time   NA 131 (L) 06/13/2021 1730   K 4.1 06/13/2021 1730   CL 100 06/13/2021 1730   CO2 21 (L) 06/13/2021 1730   GLUCOSE 122 (H) 06/13/2021 1730   BUN 22 06/13/2021 1730   CREATININE 0.54 06/13/2021 1730   CALCIUM 9.0 06/13/2021 1730   PROT 6.8 06/13/2021 1730   ALBUMIN 4.3 06/13/2021 1730   AST 23 06/13/2021 1730   ALT 25 06/13/2021 1730   ALKPHOS 60 06/13/2021 1730   BILITOT 0.5 06/13/2021 1730   GFRNONAA >60 06/13/2021 1730       Component Value Date/Time   WBC 6.9 06/13/2021 1730   RBC 4.44 06/13/2021 1730   HGB 13.7 06/13/2021 1730   HCT 40.7 06/13/2021  1730   PLT 218 06/13/2021 1730   MCV 91.7 06/13/2021 1730   MCH 30.9 06/13/2021 1730   MCHC 33.7 06/13/2021 1730   RDW 13.1 06/13/2021 1730   LYMPHSABS 0.9 03/01/2021 1318   MONOABS 0.5 03/01/2021 1318   EOSABS 0.0 03/01/2021 1318   BASOSABS 0.0 03/01/2021 1318    No results found for: "POCLITH", "LITHIUM"   No results found for: "PHENYTOIN", "PHENOBARB", "VALPROATE", "CBMZ"   .res Assessment: Plan:    Plan:  PDMP reviewed  Consider low dose of Remeron 15mg  at hs  Consider Wellbutrin XL150 mg   Continue Xanax 0.25mg  BID as needed for anxiety.  Discussed potential benefits, risk, and side effects of benzodiazepines to include potential risk of tolerance and dependence, as well as possible drowsiness. Advised patient not to drive if experiencing drowsiness and to take lowest possible effective dose to minimize risk of dependence and tolerance.   Time spent with patient was 25 minutes. Greater than 50% of face to face time with patient was spent on counseling and coordination of care.    RTC 4 weeks for 40 minutes.  Patient advised to contact office with any questions, adverse effects, or acute worsening in signs and symptoms. Diagnoses and all orders for this visit:  Generalized anxiety disorder -     ALPRAZolam (XANAX) 0.25 MG tablet; Take 1 tablet (0.25 mg total) by mouth 3 (three) times  daily as needed for anxiety.     Please see After Visit Summary for patient specific instructions.  Future Appointments  Date Time Provider Department Center  11/17/2021 12:00 PM 11/19/2021, LCSW [facility] None  11/30/2021  3:00 PM Rynn Markiewicz, 01/31/2022, NP [facility] None  12/03/2021  4:00 PM Croitoru, 12/05/2021, MD [facility]  12/08/2021  1:00 PM 12/10/2021, LCSW [facility] None  01/13/2022  1:15 PM 01/15/2022, MD [facility] None    No orders of the defined types were placed in this encounter.   -------------------------------

## 2021-11-02 NOTE — Telephone Encounter (Signed)
Pt is requesting a referral to Dr, Lurena Joiner Tat based upon her area of specialty.

## 2021-11-03 ENCOUNTER — Encounter: Payer: Self-pay | Admitting: Neurology

## 2021-11-03 NOTE — Telephone Encounter (Signed)
I called pt to clarify her messages.  She has taken 1/2 tablet daily only twice.  SE as listed in messages. 10-13-2021 then today  (A lot of drowsiness/sleepiness/ diminishes energy). I relayed that each pt is different but be days to several weeks for SE to dissipate.  She is concerned.  She is asking about not being able to function and keep moving when taking this medication. Referral to Dr. Arbutus Leas is requested.  I relayed since Dr. Frances Furbish out of office will discuss and get back to her.

## 2021-11-04 NOTE — Telephone Encounter (Signed)
I placed a mychart message to pt for pcp to do referral to Dr. Arbutus Leas.

## 2021-11-04 NOTE — Telephone Encounter (Signed)
Please advise patient to request referral from her PCP.

## 2021-11-05 DIAGNOSIS — I10 Essential (primary) hypertension: Secondary | ICD-10-CM | POA: Diagnosis not present

## 2021-11-11 DIAGNOSIS — R5383 Other fatigue: Secondary | ICD-10-CM | POA: Diagnosis not present

## 2021-11-11 DIAGNOSIS — G20 Parkinson's disease: Secondary | ICD-10-CM | POA: Diagnosis not present

## 2021-11-11 DIAGNOSIS — F411 Generalized anxiety disorder: Secondary | ICD-10-CM | POA: Diagnosis not present

## 2021-11-11 DIAGNOSIS — E871 Hypo-osmolality and hyponatremia: Secondary | ICD-10-CM | POA: Diagnosis not present

## 2021-11-11 DIAGNOSIS — I10 Essential (primary) hypertension: Secondary | ICD-10-CM | POA: Diagnosis not present

## 2021-11-11 DIAGNOSIS — E079 Disorder of thyroid, unspecified: Secondary | ICD-10-CM | POA: Diagnosis not present

## 2021-11-11 NOTE — Telephone Encounter (Signed)
This pt has only taken the 1/2 tablet sinemet (25/100) TWICE.  Since side effects she has not used anything.

## 2021-11-16 ENCOUNTER — Encounter: Payer: Self-pay | Admitting: Neurology

## 2021-11-17 ENCOUNTER — Ambulatory Visit: Payer: Medicare PPO | Admitting: Psychiatry

## 2021-11-17 DIAGNOSIS — F411 Generalized anxiety disorder: Secondary | ICD-10-CM

## 2021-11-17 NOTE — Progress Notes (Signed)
[facility] Counselor/Therapist Progress Note  Patient ID: Morgan Reese, MRN: [id number],    Date: 11/17/2021  Time Spent: 50 minutes   Treatment Type: Individual Therapy  Reported Symptoms: anxiety, jitteriness at times, some depression  Mental Status Exam:  Appearance:   Casual and Neat     Behavior:  Appropriate, Sharing, and Motivated  Motor:  Normal  Speech/Language:   Clear and Coherent  Affect:  Depressed and anxious  Mood:  anxious and depressed  Thought process:  goal directed  Thought content:    Some ruminating  Sensory/Perceptual disturbances:    WNL  Orientation:  oriented to person, place, time/date, situation, day of week, month of year, year, and stated date of November 17, 2021  Attention:  Good  Concentration:  Good  Memory:  WNL  Fund of knowledge:   Good  Insight:    Good and Fair  Judgment:   Good  Impulse Control:  Good   Risk Assessment: Danger to Self:  No Self-injurious Behavior: No Danger to Others: No Duty to Warn:no Physical Aggression / Violence:No  Access to Firearms a concern: No  Gang Involvement:No   Subjective: Patient today reporting anxiety, some depression at times, and some jitteriness. 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 Tendency to not stay in the present, and worry about the future or regret the past. Worked well today on her thought patterns that are feeding her worry and anxiety, and worked on trying to make some changes, using specific examples with which she struggles.  States that her husband also has the same difficulty in not staying in the present, and instead going to the past or into the future.  Is wanting more friends and discussed how she can go about trying to have more opportunities to develop friendships.  Does like getting out and going to the gym and doing some exercise that she is able to do even with some physical challenges.  Expressed how good it  was to "feel heard" as a lot of people do not have the patience to listen.  Encouraged her open expression here in sessions and explained to her that that is helpful to me and knowing how to best meet her needs.  Interventions: Solution-Oriented/Positive Psychology, Ego-Supportive, and Insight-Oriented  Treatment goals: Treatment goals remain on treatment plan as patient works with strategies to achieve her goals. Progress is assessed each session and noted in "subject" and /or "plan" sections of treatment note. Long-term goal: Reduce overall level, frequncy, and intensity of the anxiety so that daily functioning is not impaired.  Short-term goal: Increase understanding of beliefs and messages that produces the worry and anxiety.  Describe current and past experiences with specific fears, prominent worries, and anxiety symptoms including their impact on functioning and attempts to resolve it.  Strategies: Explore cognitive messages that mediate anxiety response and retrain in adaptive cognitions.  Diagnosis:   ICD-10-CM   1. Generalized anxiety disorder  F41.1       Plan: Patient today in session showing active participation and motivation as she focused on her anxiety and some worrying in reference to her recent diagnosis of Parkinsonian syndrome, per patient report.  Continues to look at some of her fears and anxieties, trying not to make assumptions, trying to live more in the present versus focusing on the past, and learning more about her medication.  Seemed calmer today by end of session and  more focused on goal directed behaviors to use in between sessions.  Encouraged patient to be practicing positive behaviors as discussed in sessions including: Staying in the present and focusing on what she can change, getting outside some daily and walking or going to the gym, being more careful as she walks to prevent falling, staying in touch with people who are supportive, trying not to look ahead  and imagine worst case scenarios, healthy nutrition and exercise, positive self talk, reduce overthinking and over analyzing, allow her faith to be an emotional support as well as spiritual, and realize the strength she shows as she works with goal-directed behaviors to move in a direction that supports her improved emotional health.  Goal review and progress/challenges noted with patient.  Next appointment within 3 to 4 weeks.  This record has been created using [employer].  Chart creation errors have been sought, but may not always have been located and corrected.  Such creation errors do not reflect on the standard of medical care provided.   Mathis Fare, LCSW

## 2021-11-25 DIAGNOSIS — F5101 Primary insomnia: Secondary | ICD-10-CM | POA: Diagnosis not present

## 2021-11-25 DIAGNOSIS — G20 Parkinson's disease: Secondary | ICD-10-CM | POA: Diagnosis not present

## 2021-11-25 DIAGNOSIS — E038 Other specified hypothyroidism: Secondary | ICD-10-CM | POA: Diagnosis not present

## 2021-11-25 DIAGNOSIS — Z79899 Other long term (current) drug therapy: Secondary | ICD-10-CM | POA: Diagnosis not present

## 2021-11-25 DIAGNOSIS — F411 Generalized anxiety disorder: Secondary | ICD-10-CM | POA: Diagnosis not present

## 2021-11-25 DIAGNOSIS — R2681 Unsteadiness on feet: Secondary | ICD-10-CM | POA: Diagnosis not present

## 2021-11-30 ENCOUNTER — Encounter: Payer: Self-pay | Admitting: Adult Health

## 2021-11-30 ENCOUNTER — Ambulatory Visit: Payer: Medicare PPO | Admitting: Adult Health

## 2021-11-30 DIAGNOSIS — F411 Generalized anxiety disorder: Secondary | ICD-10-CM

## 2021-11-30 NOTE — Progress Notes (Signed)
Morgan Reese [id number] 07-Nov-1950 71 y.o.  Subjective:   Patient ID:  Morgan Reese is a 71 y.o. (DOB 1951/04/02) female.  Chief Complaint: No chief complaint on file.   HPI Morgan Reese presents to the office today for follow-up of anxiety disorder.    Describes mood today as "concerned". Pleasant. Denies tearfulness. Mood symptoms - reports depression and anxiety. Feels irritable at times. Reports worry and rumination. Reports obsessions. Stating "I'm hanging in there". Concerned about taking an anti-depressant with low sodium levels. Considering Remeron. Feels like the Xanax continues to work well for her - not taking twice daily every day. Recently diagnosed with Parkinson's Disease - tried one dose Carbo-Dopa Levo-Dopa and did not tolerate it. Meeting with a neurologist Lurena Joiner Tat on 12/07/2021.  Concerned about her future with the diagnosis. Varying interest and motivation. Taking medications as prescribed.  Energy levels lower. Active, has a regular exercise routine currently. Walking at the park. Working with a Psychologist, educational twice a week. Enjoys some usual interests and activities. Married - lives with husband. Has 2 adult children. Spending time with family.  Appetite adequate. Weight loss - 123 pounds. Sleeps well most nights. Averages 6 to 8 hours of broken sleep. Focus and concentration stable. Completing tasks. Managing aspects of household. Retired. Denies SI or HI.  Denies AH or VH. Denies paranoia.   Previous medication trials: Prozac   Flowsheet Row ED from 06/13/2021 in [facility] ED to Hosp-Admission (Discharged) from 03/10/2021 in [facility] ED from 03/01/2021 in [facility]  C-SSRS RISK CATEGORY No Risk No Risk No Risk        Review of Systems:  Review of Systems  Musculoskeletal:  Negative for gait problem.  Neurological:  Negative for tremors.   Psychiatric/Behavioral:         Please refer to HPI    Medications: I have reviewed the patient's current medications.  Current Outpatient Medications  Medication Sig Dispense Refill   ALPRAZolam (XANAX) 0.25 MG tablet Take 1 tablet (0.25 mg total) by mouth 3 (three) times daily as needed for anxiety. 90 tablet 2   Ascorbic Acid (VITAMIN C) 1000 MG tablet Take 1,000 mg by mouth daily.     carbidopa-levodopa (SINEMET IR) 25-100 MG tablet Take 0.5 tablets by mouth 3 (three) times daily. 45 tablet 3   Cholecalciferol (VITAMIN D3 PO) Take by mouth. Alternates 10,000 units with 5,000 units each day.     diltiazem (TIAZAC) 120 MG 24 hr capsule Take 120 mg by mouth daily.     estradiol (ESTRACE) 0.1 MG/GM vaginal cream Place 1 Applicatorful vaginally at bedtime.     estradiol (VIVELLE-DOT) 0.0375 MG/24HR Place 1 patch onto the skin 2 (two) times a week.     hydrOXYzine (ATARAX/VISTARIL) 25 MG tablet Take 1 tablet (25 mg total) by mouth every 6 (six) hours as needed for anxiety. 20 tablet 0   levothyroxine (SYNTHROID) 50 MCG tablet Take 50 mcg by mouth daily before breakfast.     Magnesium Citrate 200 MG TABS See admin instructions.     melatonin 3 MG TABS tablet Take 6-9 mg by mouth at bedtime.     metoprolol tartrate (LOPRESSOR) 25 MG tablet Take 1 tablet (25 mg total) by mouth daily as needed. (Patient taking differently: Take 25 mg by mouth 2 (two) times daily. 50 MG TOTAL) 10 tablet 0   Naltrexone HCl, Pain, 1.5 MG CAPS Take by mouth 2 (two)  times daily.     Omega-3 Fatty Acids (FISH OIL) 1000 MG CAPS Take 1 capsule by mouth at bedtime.     progesterone (PROMETRIUM) 200 MG capsule Take 200 mg by mouth daily.     pyridOXINE (B-6) 50 MG tablet Take 50 mg by mouth daily.     ramelteon (ROZEREM) 8 MG tablet Take by mouth as needed.     thiamine 100 MG tablet Take 100 mg by mouth daily.     Vitamin D, Ergocalciferol, (DRISDOL) 50000 UNITS CAPS capsule Take 50,000 Units by mouth every 7 (seven)  days.     VITAMIN K PO Take by mouth daily. MK7     zinc gluconate 50 MG tablet Take 50 mg by mouth daily.     No current facility-administered medications for this visit.    Medication Side Effects: None  Allergies:  Allergies  Allergen Reactions   Amoxicillin Swelling    REACTION: Head felt over-heated Other reaction(s): Other (See Comments) REACTION: Head felt over-heated    Serotonin Reuptake Inhibitors (Ssris) Other (See Comments)    Other reaction(s): hyponatremia SSRI's are contraindicated, causes hyponatremia     Past Medical History:  Diagnosis Date   Hypertension    Sinus congestion    Thyroid disease     Past Medical History, Surgical history, Social history, and Family history were reviewed and updated as appropriate.   Please see review of systems for further details on the patient's review from today.   Objective:   Physical Exam:  There were no vitals taken for this visit.  Physical Exam Constitutional:      General: She is not in acute distress. Musculoskeletal:        General: No deformity.  Neurological:     Mental Status: She is alert and oriented to person, place, and time.     Coordination: Coordination normal.  Psychiatric:        Attention and Perception: Attention and perception normal. She does not perceive auditory or visual hallucinations.        Mood and Affect: Mood normal. Mood is not anxious or depressed. Affect is not labile, blunt, angry or inappropriate.        Speech: Speech normal.        Behavior: Behavior normal.        Thought Content: Thought content normal. Thought content is not paranoid or delusional. Thought content does not include homicidal or suicidal ideation. Thought content does not include homicidal or suicidal plan.        Cognition and Memory: Cognition and memory normal.        Judgment: Judgment normal.     Comments: Insight intact     Lab Review:     Component Value Date/Time   NA 131 (L) 06/13/2021  1730   K 4.1 06/13/2021 1730   CL 100 06/13/2021 1730   CO2 21 (L) 06/13/2021 1730   GLUCOSE 122 (H) 06/13/2021 1730   BUN 22 06/13/2021 1730   CREATININE 0.54 06/13/2021 1730   CALCIUM 9.0 06/13/2021 1730   PROT 6.8 06/13/2021 1730   ALBUMIN 4.3 06/13/2021 1730   AST 23 06/13/2021 1730   ALT 25 06/13/2021 1730   ALKPHOS 60 06/13/2021 1730   BILITOT 0.5 06/13/2021 1730   GFRNONAA >60 06/13/2021 1730       Component Value Date/Time   WBC 6.9 06/13/2021 1730   RBC 4.44 06/13/2021 1730   HGB 13.7 06/13/2021 1730   HCT 40.7 06/13/2021 1730  PLT 218 06/13/2021 1730   MCV 91.7 06/13/2021 1730   MCH 30.9 06/13/2021 1730   MCHC 33.7 06/13/2021 1730   RDW 13.1 06/13/2021 1730   LYMPHSABS 0.9 03/01/2021 1318   MONOABS 0.5 03/01/2021 1318   EOSABS 0.0 03/01/2021 1318   BASOSABS 0.0 03/01/2021 1318    No results found for: "POCLITH", "LITHIUM"   No results found for: "PHENYTOIN", "PHENOBARB", "VALPROATE", "CBMZ"   .res Assessment: Plan:    Plan:  PDMP reviewed  Continue Xanax 0.25mg  TID as needed for anxiety - undertakes - taking one to two tablets daily.  Discussed potential benefits, risk, and side effects of benzodiazepines to include potential risk of tolerance and dependence, as well as possible drowsiness. Advised patient not to drive if experiencing drowsiness and to take lowest possible effective dose to minimize risk of dependence and tolerance.   Time spent with patient was 25 minutes. Greater than 50% of face to face time with patient was spent on counseling and coordination of care.    RTC 4 weeks   Diagnoses and all orders for this visit:  Generalized anxiety disorder     Please see After Visit Summary for patient specific instructions.  Future Appointments  Date Time Provider Department Center  12/03/2021  4:00 PM Croitoru, Rachelle Hora, MD [facility]  12/10/2021  3:00 PM Mathis Fare, LCSW [facility] None  12/17/2021  9:45 AM Tat, Octaviano Batty, DO [facility]  None  01/13/2022  1:15 PM Huston Foley, MD [facility] None    No orders of the defined types were placed in this encounter.   -------------------------------

## 2021-12-01 DIAGNOSIS — N3000 Acute cystitis without hematuria: Secondary | ICD-10-CM | POA: Diagnosis not present

## 2021-12-01 DIAGNOSIS — R399 Unspecified symptoms and signs involving the genitourinary system: Secondary | ICD-10-CM | POA: Diagnosis not present

## 2021-12-01 DIAGNOSIS — R309 Painful micturition, unspecified: Secondary | ICD-10-CM | POA: Diagnosis not present

## 2021-12-03 ENCOUNTER — Ambulatory Visit: Payer: Medicare PPO | Admitting: Cardiovascular Disease

## 2021-12-03 ENCOUNTER — Encounter (HOSPITAL_BASED_OUTPATIENT_CLINIC_OR_DEPARTMENT_OTHER): Payer: Self-pay

## 2021-12-03 ENCOUNTER — Other Ambulatory Visit: Payer: Self-pay

## 2021-12-03 ENCOUNTER — Emergency Department (HOSPITAL_BASED_OUTPATIENT_CLINIC_OR_DEPARTMENT_OTHER)
Admission: EM | Admit: 2021-12-03 | Discharge: 2021-12-03 | Disposition: A | Discharge status: Home / Self Care | Payer: Medicare PPO | Attending: Emergency Medicine | Admitting: Emergency Medicine

## 2021-12-03 ENCOUNTER — Telehealth: Payer: Self-pay | Admitting: Cardiovascular Disease

## 2021-12-03 ENCOUNTER — Emergency Department (HOSPITAL_BASED_OUTPATIENT_CLINIC_OR_DEPARTMENT_OTHER): Payer: Medicare PPO | Admitting: Radiology

## 2021-12-03 ENCOUNTER — Other Ambulatory Visit (HOSPITAL_BASED_OUTPATIENT_CLINIC_OR_DEPARTMENT_OTHER): Payer: Self-pay

## 2021-12-03 DIAGNOSIS — I10 Essential (primary) hypertension: Secondary | ICD-10-CM | POA: Diagnosis not present

## 2021-12-03 DIAGNOSIS — G20 Parkinson's disease: Secondary | ICD-10-CM | POA: Diagnosis not present

## 2021-12-03 DIAGNOSIS — Z79899 Other long term (current) drug therapy: Secondary | ICD-10-CM | POA: Diagnosis not present

## 2021-12-03 DIAGNOSIS — J40 Bronchitis, not specified as acute or chronic: Secondary | ICD-10-CM | POA: Diagnosis not present

## 2021-12-03 DIAGNOSIS — R490 Dysphonia: Secondary | ICD-10-CM | POA: Diagnosis not present

## 2021-12-03 DIAGNOSIS — R062 Wheezing: Secondary | ICD-10-CM | POA: Diagnosis not present

## 2021-12-03 DIAGNOSIS — R0602 Shortness of breath: Secondary | ICD-10-CM | POA: Diagnosis not present

## 2021-12-03 HISTORY — DX: Parkinson's disease: G20

## 2021-12-03 HISTORY — DX: Parkinson's disease without dyskinesia, without mention of fluctuations: G20.A1

## 2021-12-03 LAB — CBC WITH DIFFERENTIAL/PLATELET
Abs Immature Granulocytes: 0.02 10*3/uL (ref 0.00–0.07)
Basophils Absolute: 0.1 10*3/uL (ref 0.0–0.1)
Basophils Relative: 1 %
Eosinophils Absolute: 0.1 10*3/uL (ref 0.0–0.5)
Eosinophils Relative: 1 %
HCT: 42.7 % (ref 36.0–46.0)
Hemoglobin: 14.4 g/dL (ref 12.0–15.0)
Immature Granulocytes: 0 %
Lymphocytes Relative: 11 %
Lymphs Abs: 0.8 10*3/uL (ref 0.7–4.0)
MCH: 30.8 pg (ref 26.0–34.0)
MCHC: 33.7 g/dL (ref 30.0–36.0)
MCV: 91.2 fL (ref 80.0–100.0)
Monocytes Absolute: 0.5 10*3/uL (ref 0.1–1.0)
Monocytes Relative: 7 %
Neutro Abs: 5.8 10*3/uL (ref 1.7–7.7)
Neutrophils Relative %: 80 %
Platelets: 209 10*3/uL (ref 150–400)
RBC: 4.68 MIL/uL (ref 3.87–5.11)
RDW: 12.8 % (ref 11.5–15.5)
WBC: 7.3 10*3/uL (ref 4.0–10.5)
nRBC: 0 % (ref 0.0–0.2)

## 2021-12-03 LAB — COMPREHENSIVE METABOLIC PANEL
ALT: 19 U/L (ref 0–44)
AST: 17 U/L (ref 15–41)
Albumin: 4.4 g/dL (ref 3.5–5.0)
Alkaline Phosphatase: 53 U/L (ref 38–126)
Anion gap: 9 (ref 5–15)
BUN: 18 mg/dL (ref 8–23)
CO2: 25 mmol/L (ref 22–32)
Calcium: 9.7 mg/dL (ref 8.9–10.3)
Chloride: 100 mmol/L (ref 98–111)
Creatinine, Ser: 0.67 mg/dL (ref 0.44–1.00)
GFR, Estimated: >60 mL/min (ref >60)
Glucose, Bld: 100 mg/dL — ABNORMAL HIGH (ref 70–99)
Potassium: 4.1 mmol/L (ref 3.5–5.1)
Sodium: 134 mmol/L — ABNORMAL LOW (ref 135–145)
Total Bilirubin: 0.6 mg/dL (ref 0.3–1.2)
Total Protein: 6.6 g/dL (ref 6.5–8.1)

## 2021-12-03 MED ORDER — METHYLPREDNISOLONE 4 MG PO TBPK: ORAL_TABLET | ORAL | 0 refills | Status: DC

## 2021-12-03 MED ORDER — LORAZEPAM 2 MG/ML IJ SOLN
0.5000 mg | Freq: Once | INTRAMUSCULAR | Status: AC
  Administered 2021-12-03: 0.5 mg via INTRAVENOUS
  Filled 2021-12-03: qty 1

## 2021-12-03 MED ORDER — IPRATROPIUM-ALBUTEROL 0.5-2.5 (3) MG/3ML IN SOLN
3.0000 mL | RESPIRATORY_TRACT | Status: AC
  Administered 2021-12-03: 3 mL via RESPIRATORY_TRACT
  Filled 2021-12-03: qty 3

## 2021-12-03 MED ORDER — METHYLPREDNISOLONE SODIUM SUCC 125 MG IJ SOLR
125.0000 mg | Freq: Once | INTRAMUSCULAR | Status: AC
  Administered 2021-12-03: 125 mg via INTRAVENOUS
  Filled 2021-12-03: qty 2

## 2021-12-03 MED ORDER — METHYLPREDNISOLONE 4 MG PO TBPK
ORAL_TABLET | ORAL | 0 refills | Status: DC
  Filled 2021-12-03: qty 1, fill #0

## 2021-12-03 MED ORDER — CEPHALEXIN 500 MG PO CAPS: 500.0000 mg | ORAL_CAPSULE | Freq: Three times a day (TID) | ORAL | 0 refills | Status: AC

## 2021-12-03 MED ORDER — CEPHALEXIN 500 MG PO CAPS
500.0000 mg | ORAL_CAPSULE | Freq: Three times a day (TID) | ORAL | 0 refills | Status: DC
  Filled 2021-12-03: qty 15, 5d supply, fill #0

## 2021-12-03 NOTE — ED Triage Notes (Signed)
"  Congestion in chest, wheezing and I can't seem to get it to clear, my heart rate has been up as well, anxious, started macrobid yesterday for a UTI so I am not sure if it is related to the medication" per pt

## 2021-12-03 NOTE — ED Provider Notes (Signed)
[facility] EMERGENCY DEPT Provider Note   CSN: [id number] Arrival date & time: 12/03/21  1202     History  Chief Complaint  Patient presents with   Shortness of Breath    OLA RAAP is a 71 y.o. female.  Patient with history of anxiety, hypertension, and Parkinson's presents today with complaints of shortness of breath. She states that same began this morning soon after she woke up. States that she has been wheezing which is unusual for her. She states that on Tuesday she started [facility] for a UTI and has been taking this as prescribed since Tuesday. She expresses concern that this could be an allergic reaction to this medication. She denies any recent illness, fevers, chills, cough, congestion, or chest pain. Also denies any leg pain or leg swelling.  The history is provided by the patient. No language interpreter was used.  Shortness of Breath Associated symptoms: wheezing        Home Medications Prior to Admission medications   Medication Sig Start Date End Date Taking? Authorizing Provider  ALPRAZolam (XANAX) 0.25 MG tablet Take 1 tablet (0.25 mg total) by mouth 3 (three) times daily as needed for anxiety. 11/02/21   Mozingo, Thereasa Solo, NP  Ascorbic Acid (VITAMIN C) 1000 MG tablet Take 1,000 mg by mouth daily.    [provider]  carbidopa-levodopa (SINEMET IR) 25-100 MG tablet Take 0.5 tablets by mouth 3 (three) times daily. 10/13/21   Huston Foley, MD  Cholecalciferol (VITAMIN D3 PO) Take by mouth. Alternates 10,000 units with 5,000 units each day.    [provider]  diltiazem (TIAZAC) 120 MG 24 hr capsule Take 120 mg by mouth daily.    [provider]  estradiol (ESTRACE) 0.1 MG/GM vaginal cream Place 1 Applicatorful vaginally at bedtime.    [provider]  estradiol (VIVELLE-DOT) 0.0375 MG/24HR Place 1 patch onto the skin 2 (two) times a week. 04/14/21   [provider]  hydrOXYzine (ATARAX/VISTARIL)  25 MG tablet Take 1 tablet (25 mg total) by mouth every 6 (six) hours as needed for anxiety. 03/01/21   Jacalyn Lefevre, MD  levothyroxine (SYNTHROID) 50 MCG tablet Take 50 mcg by mouth daily before breakfast.    [provider]  Magnesium Citrate 200 MG TABS See admin instructions.    [provider]  melatonin 3 MG TABS tablet Take 6-9 mg by mouth at bedtime.    [provider]  metoprolol tartrate (LOPRESSOR) 25 MG tablet Take 1 tablet (25 mg total) by mouth daily as needed. Patient taking differently: Take 25 mg by mouth 2 (two) times daily. 50 MG TOTAL 01/01/21   Charlynne Pander, MD  Naltrexone HCl, Pain, 1.5 MG CAPS Take by mouth 2 (two) times daily.    [provider]  Omega-3 Fatty Acids (FISH OIL) 1000 MG CAPS Take 1 capsule by mouth at bedtime.    [provider]  progesterone (PROMETRIUM) 200 MG capsule Take 200 mg by mouth daily.    [provider]  pyridOXINE (B-6) 50 MG tablet Take 50 mg by mouth daily.    [provider]  ramelteon (ROZEREM) 8 MG tablet Take by mouth as needed. 07/23/21   [provider]  thiamine 100 MG tablet Take 100 mg by mouth daily.    [provider]  Vitamin D, Ergocalciferol, (DRISDOL) 50000 UNITS CAPS capsule Take 50,000 Units by mouth every 7 (seven) days.    [provider]  VITAMIN K PO Take  by mouth daily. MK7    [provider]  zinc gluconate 50 MG tablet Take 50 mg by mouth daily.    [provider]      Allergies    Amoxicillin and Serotonin reuptake inhibitors (ssris)    Review of Systems   Review of Systems  Respiratory:  Positive for shortness of breath and wheezing.   All other systems reviewed and are negative.   Physical Exam Updated Vital Signs BP 136/80   Pulse 78   Temp 98.8 F (37.1 C) (Oral)   Resp 18   Ht 5\' 3"  (1.6 m)   Wt 54.4 kg   SpO2 100%   BMI 21.26 kg/m  Physical Exam Vitals and nursing note reviewed.   Constitutional:      General: She is not in acute distress.    Appearance: Normal appearance. She is normal weight. She is not ill-appearing, toxic-appearing or diaphoretic.  HENT:     Head: Normocephalic and atraumatic.  Cardiovascular:     Rate and Rhythm: Normal rate.  Pulmonary:     Effort: Pulmonary effort is normal. No respiratory distress.     Breath sounds: Wheezing present.     Comments: Patient speaking in complete sentences in no distress  Initially, inspiratory and expiratory wheezes noted throughout, significantly improved with nebs and steroids Musculoskeletal:        General: Normal range of motion.     Cervical back: Normal range of motion.  Skin:    General: Skin is warm and dry.  Neurological:     General: No focal deficit present.     Mental Status: She is alert.  Psychiatric:        Mood and Affect: Mood normal.        Behavior: Behavior normal.    ED Results / Procedures / Treatments   Labs (all labs ordered are listed, but only abnormal results are displayed) Labs Reviewed  COMPREHENSIVE METABOLIC PANEL - Abnormal; Notable for the following components:      Result Value   Sodium 134 (*)    Glucose, Bld 100 (*)    All other components within normal limits  CBC WITH DIFFERENTIAL/PLATELET    EKG None  Radiology DG Chest 2 View  Result Date: 12/03/2021 CLINICAL DATA:  Shortness of breath and wheezing. EXAM: CHEST - 2 VIEW COMPARISON:  Chest radiograph June 13, 2021 FINDINGS: The heart size and mediastinal contours are within normal limits. Aortic atherosclerosis. Mild diffuse bronchial wall thickening may reflect bronchitis. No focal airspace consolidation. No pleural effusion. No pneumothorax. No acute osseous abnormality. IMPRESSION: Mild diffuse bronchial wall thickening may reflect bronchitis. No focal airspace consolidation. Electronically Signed   By: June 15, 2021 M.D.   On: 12/03/2021 13:02    Procedures Procedures    Medications  Ordered in ED Medications  ipratropium-albuterol (DUONEB) 0.5-2.5 (3) MG/3ML nebulizer solution 3 mL (3 mLs Nebulization Not Given 12/03/21 1450)  methylPREDNISolone sodium succinate (SOLU-MEDROL) 125 mg/2 mL injection 125 mg (125 mg Intravenous Given 12/03/21 1320)  LORazepam (ATIVAN) injection 0.5 mg (0.5 mg Intravenous Given 12/03/21 1323)    ED Course/ Medical Decision Making/ A&P                           Medical Decision Making Amount and/or Complexity of Data Reviewed Labs: ordered. Radiology: ordered.  Risk Prescription drug management.   This patient presents to the ED for concern of shortness of breath, this involves  an extensive number of treatment options, and is a complaint that carries with it a high risk of complications and morbidity.  The differential diagnosis includes allergic reaction, pneumonia, pneumothorax. This is not an exhaustive differential   Lab Tests  I Ordered, and personally interpreted labs.  The pertinent results include:  no leukocytosis or anemia. Na 134 consistent with baseline. No other acute laboratory findings   Imaging Studies ordered:  I ordered imaging studies including CXR  I independently visualized and interpreted imaging which showed  Mild diffuse bronchial wall thickening may reflect bronchitis. No focal airspace consolidation. I agree with the radiologist interpretation   Cardiac Monitoring: / EKG:  The patient was maintained on a cardiac monitor.  I personally viewed and interpreted the cardiac monitored which showed an underlying rhythm of: no STEMI   Problem List / ED Course / Critical interventions / Medication management  I ordered medication including duonebs x 3 and solu-medrol  for shortness of breath, wheezing  Reevaluation of the patient after these medicines showed that the patient improved I have reviewed the patients home medicines and have made adjustments as needed  Test / Admission - Considered:  Patient  presents today with complaints of shortness of breath since this morning.  She is afebrile, nontoxic-appearing, and in no acute distress with reassuring vital signs.  Physical exam initially revealed significant wheezing on inspiration and expiration.  After DuoNebs and steroids, significant improvement.  Patient is now able to ambulate throughout the department without any drop in oxygen saturation or tachycardia or tachypnea.  Given this, no further emergent concerns at this time.  Chest x-ray did show bronchitis, will send home with a Medrol Dosepak for same.  Given that there is some concern for allergic reaction from Macrobid, will switch to Keflex.  Patient does have an allergy to amoxicillin, however she states her allergy was 'head fullness' therefore feels that cephalosporins are appropriate in this case.  Patient is understanding and amenable with plan, educated on red flag symptoms of prompt immediate return.  Discharged stable condition.  Of note, when discussing discharge paperwork with the patient, she did express some concerns related to some voice changes over the past several months.  No acute changes today, therefore very low suspicion that this is related to the patient's condition today.  However, will give ENT referral for further evaluation of same.  Patient understanding and amenable with this plan.  Findings and plan of care discussed with supervising physician Dr. Rubin Payor who is in agreement.    Final Clinical Impression(s) / ED Diagnoses Final diagnoses:  Bronchitis  Chronic hoarseness    Rx / DC Orders ED Discharge Orders          Ordered    cephALEXin (KEFLEX) 500 MG capsule  3 times daily        12/03/21 1611    methylPREDNISolone (MEDROL DOSEPAK) 4 MG TBPK tablet        12/03/21 1611          An After Visit Summary was printed and given to the patient.     Vear Clock 12/03/21 1614    Benjiman Core, MD 12/04/21 [id number]

## 2021-12-03 NOTE — Discharge Instructions (Addendum)
As we discussed, your work-up in the ER today was reassuring for acute abnormalities.  X-ray imaging did show some signs of bronchitis and given that you had improvement with nebulizer and steroids, I have given you a prescription for urates for you to take as prescribed for continued management of your symptoms.  Given that there was some concern that this could be related to an allergy to Macrobid, I recommend that you do not take any more of this medication and instead switch to the Keflex that I have prescribed you.  Please fill this prescription and take it daily its entirety as prescribed.  I also recommend that you follow-up with your primary care doctor as needed for continued evaluation and management of your symptoms.  Additionally, given that you have endorsed some chronic hoarseness I have given you a referral to ENT with a number to to call to schedule an appointment for further evaluation and management of this.  Return if development of any new or worsening symptoms.

## 2021-12-03 NOTE — ED Notes (Signed)
RT Note: Pt. ambulated on room air with Pulse Oximeter 1/2 way around [facility]/ED with results as follows: Starting: HR: 89/ Sats: 98% Finish:    HR: 96/ Sats: 98% HR returned to 93 upon finishing ambulation, tolerated well with Husband at bedside.

## 2021-12-03 NOTE — Telephone Encounter (Signed)
-  Pt called to report increased sob and wheezing -Pt also report she feels swelling in her throat and chest area. -Pt stated she recently started abx and questioning if symptoms could be related to medication. -Nurse advised pt she need to report to ER for further evaluations. -Pt verbalized understanding

## 2021-12-07 NOTE — Progress Notes (Unsigned)
Assessment/Plan:   1.  Parkinsons disease  -DaTscan completed on the 16th, 2023 was abnormal, with decreased radiotracer in right putamen.  -Discussed skin biopsies for alpha-synuclein.  -Patient felt that she could not tolerate levodopa, but she has only taken half a tablet of levodopa on 2 separate occasions.  I think that this would be very unusual to not tolerate this type of dose, especially since this likely did not even get into the CNS at this dose.  I think that the bigger issue is likely anxiety, which she is already being treated for at [facility]. ***  2.  GAD  -I would really like to see the patient on something other than Xanax.  Xanax is not ideal in this age group, and especially in someone with Parkinson's disease.  The patient is incredibly anxious and her only treatment is a benzodiazepine.  I think that more ideal medications could be found on the long-term.  Subjective:   Morgan Reese was seen today in the movement disorders clinic for neurologic consultation at the request of Moshe Cipro, NP.  The consultation is for the evaluation of second opinion for parkinsonism.  Patient previously seen [facility] neurology, Dr. Frances Furbish.  Notes are reviewed.  Patient first saw Dr. Frances Furbish Sep 22, 2021.  At that visit, it was felt she had some parkinsonism, but it was noted that "it is difficult to say for sure if she has idiopathic Parkinson's disease at this time.  She has quite a bit of anxiety."  Patient was sent for DaTscan.  This was completed on Oct 06, 2021.  This demonstrated decreased radiotracer uptake in the right putamen.  The patient saw Dr. Frances Furbish on May 23.  She was started on carbidopa/levodopa 25/100, half tablet 3 times per day.  The patient emailed after 1/2 tablet and said that her blood pressure was elevated.  She had significant anxiety and sent multiple emails messages.   Specific Symptoms:  Tremor: {yes no:314532} Family hx of similar:  {yes  no:314532} Voice: *** Sleep: ***  Vivid Dreams:  {yes no:314532}  Acting out dreams:  {yes no:314532} Wet Pillows: {yes no:314532} Postural symptoms:  {yes no:314532}  Falls?  {yes no:314532} Bradykinesia symptoms: {parkinson brady:18041} Loss of smell:  {yes no:314532} Loss of taste:  {yes no:314532} Urinary Incontinence:  {yes no:314532} Difficulty Swallowing:  {yes no:314532} Handwriting, micrographia: {yes no:314532} Trouble with ADL's:  {yes no:314532}  Trouble buttoning clothing: {yes no:314532} Depression:  {yes no:314532} Memory changes:  {yes no:314532} Hallucinations:  {yes no:314532}  visual distortions: {yes no:314532} N/V:  {yes no:314532} Lightheaded:  {yes no:314532}  Syncope: {yes no:314532} Diplopia:  {yes no:314532} Dyskinesia:  {yes no:314532} Prior exposure to reglan/antipsychotics: {yes no:314532}  Neuroimaging of the brain has *** previously been performed.  It *** available for my review today.  PREVIOUS MEDICATIONS: {Parkinson's RX:18200}  ALLERGIES:   Allergies  Allergen Reactions   Amoxicillin Swelling    REACTION: Head felt over-heated Other reaction(s): Other (See Comments) REACTION: Head felt over-heated    Serotonin Reuptake Inhibitors (Ssris) Other (See Comments)    Other reaction(s): hyponatremia SSRI's are contraindicated, causes hyponatremia     CURRENT MEDICATIONS:  No outpatient medications have been marked as taking for the 12/08/21 encounter (Appointment) with Mylo Choi, Octaviano Batty, DO.     Objective:   VITALS:  There were no vitals filed for this visit.  GEN:  The patient appears stated age and is in NAD. HEENT:  Normocephalic, atraumatic.  The mucous membranes are  moist. The superficial temporal arteries are without ropiness or tenderness. CV:  RRR Lungs:  CTAB Neck/HEME:  There are no carotid bruits bilaterally.  Neurological examination:  Orientation: The patient is alert and oriented x3.  Cranial nerves: There is good  facial symmetry. Extraocular muscles are intact. The visual fields are full to confrontational testing. The speech is fluent and clear. Soft palate rises symmetrically and there is no tongue deviation. Hearing is intact to conversational tone. Sensation: Sensation is intact to light and pinprick throughout (facial, trunk, extremities). Vibration is intact at the bilateral big toe. There is no extinction with double simultaneous stimulation. There is no sensory dermatomal level identified. Motor: Strength is 5/5 in the bilateral upper and lower extremities.   Shoulder shrug is equal and symmetric.  There is no pronator drift. Deep tendon reflexes: Deep tendon reflexes are 2/4 at the bilateral biceps, triceps, brachioradialis, patella and achilles. Plantar responses are downgoing bilaterally.  Movement examination: Tone: There is ***tone in the bilateral upper extremities.  The tone in the lower extremities is ***.  Abnormal movements: *** Coordination:  There is *** decremation with RAM's, *** Gait and Station: The patient has *** difficulty arising out of a deep-seated chair without the use of the hands. The patient's stride length is ***.  The patient has a *** pull test.     I have reviewed and interpreted the following labs independently   Chemistry      Component Value Date/Time   NA 134 (L) 12/03/2021 1221   K 4.1 12/03/2021 1221   CL 100 12/03/2021 1221   CO2 25 12/03/2021 1221   BUN 18 12/03/2021 1221   CREATININE 0.67 12/03/2021 1221      Component Value Date/Time   CALCIUM 9.7 12/03/2021 1221   ALKPHOS 53 12/03/2021 1221   AST 17 12/03/2021 1221   ALT 19 12/03/2021 1221   BILITOT 0.6 12/03/2021 1221      Lab Results  Component Value Date   TSH 3.036 06/13/2021   Lab Results  Component Value Date   WBC 7.3 12/03/2021   HGB 14.4 12/03/2021   HCT 42.7 12/03/2021   MCV 91.2 12/03/2021   PLT 209 12/03/2021     Total time spent on today's visit was ***60 minutes,  including both face-to-face time and nonface-to-face time.  Time included that spent on review of records (prior notes available to me/labs/imaging if pertinent), discussing treatment and goals, answering patient's questions and coordinating care.  Cc:  Moshe Cipro, NP

## 2021-12-08 ENCOUNTER — Ambulatory Visit: Payer: Medicare PPO | Admitting: Psychiatry

## 2021-12-08 ENCOUNTER — Ambulatory Visit: Payer: Medicare PPO | Admitting: Neurology

## 2021-12-08 ENCOUNTER — Telehealth: Payer: Self-pay | Admitting: Neurology

## 2021-12-08 ENCOUNTER — Encounter: Payer: Self-pay | Admitting: Neurology

## 2021-12-08 VITALS — BP 106/69 | HR 87 | Ht 63.0 in | Wt 120.0 lb

## 2021-12-08 DIAGNOSIS — R202 Paresthesia of skin: Secondary | ICD-10-CM | POA: Diagnosis not present

## 2021-12-08 DIAGNOSIS — F411 Generalized anxiety disorder: Secondary | ICD-10-CM

## 2021-12-08 DIAGNOSIS — M21372 Foot drop, left foot: Secondary | ICD-10-CM

## 2021-12-08 DIAGNOSIS — G20C Parkinsonism, unspecified: Secondary | ICD-10-CM

## 2021-12-08 DIAGNOSIS — M545 Low back pain, unspecified: Secondary | ICD-10-CM | POA: Diagnosis not present

## 2021-12-08 DIAGNOSIS — G20 Parkinson's disease: Secondary | ICD-10-CM

## 2021-12-08 NOTE — Telephone Encounter (Signed)
Called pateint and let her know we are looking at the Lumbar spine

## 2021-12-08 NOTE — Telephone Encounter (Signed)
Patient called requesting a call back. She has questions about what body part is getting the MRI.

## 2021-12-08 NOTE — Patient Instructions (Signed)
Local and Online Resources for Power over Parkinson's Group June 2023  LOCAL [place] PARKINSON'S GROUPS  Power over Parkinson's Group:   Power Over Parkinson's Patient Education Group will be Wednesday, June 14th-*Hybrid meting*- in person at [place] [facility] location and via WEBEX at 2:00 pm.   Upcoming Power over Parkinson's Meetings:  2nd Wednesdays of the month at 2 pm:   June 14th, July 12th Contact Amy Marriott at [email or web address] if interested in participating in this group Parkinson's Care [facility] Group:    3rd Mondays, Contact Misty Paladino Atypical Parkinsonian Patient Group:   4th Wednesdays, Contact Misty Paladino If you are interested in participating in these groups with Misty, please contact her directly for how to join those meetings.  Her contact information is [email or web address].    LOCAL EVENTS AND NEW OFFERINGS Dance Class for People with Parkinson's at [employer].  Friday, June 9th at 2 pm.  Led by [employer] DPT students.  Contact [email or web address] to register or with questions. Ice Cream Social at [employer]!  Thursday, June 15th, 5:30-7:00 pm.  RSVP to [email or web address] for attendance and free ice cream. Parkinson's T-shirts for sale!  Designed by a local group member, with funds going to Movement Disorders Fund.  $25.00  Contact Misty to purchase  New PWR! Moves Community Fitness Instructor-Led Class offering at [employer]!  Wednesdays 1-2 pm, starting April 12th.   Contact Susan Laney, Fitness Manager at [employer].  [email or web address]  ONLINE EDUCATION AND SUPPORT Parkinson Foundation:  www.parkinson.org PD Health at Home continues:  Mindfulness Mondays, Wellness Wednesdays, Fitness Fridays  Upcoming Education: Parkinson's 101:  What You and Your Family Should Know.  Wednesday, June 7th at 1:00 pm Register for expert briefings (webinars) at  https://www.parkinson.org/resources-support/online-education/expert-briefings-webinars Please check out their website to sign up for emails and see their full online offerings   [employer] Foundation:  www.michaeljfox.org  Third Thursday Webinars:  On the third Thursday of every month at 12 p.m. ET, join our free live webinars to learn about various aspects of living with Parkinson's disease and our work to speed medical breakthroughs. Upcoming Webinar: REPLAY:  From Low Blood Pressure to Bladder Problems:  A Look at Lesser Known Parkinson's Symptoms.  Thursday, June 15th at 12 noon. Check out additional information on their website to see their full online offerings  [employer] Phinney Foundation:  www.davisphinneyfoundation.org Upcoming Webinar:   Stay tuned Webinar Series:  Living with Parkinson's Meetup.   Third Thursdays each month, 3 pm Care Partner Monthly Meetup.  With Connie Carpenter Phinney.  First Tuesday of each month, 2 pm Check out additional information to Live Well Today on their website  Parkinson and Movement Disorders (PMD) Alliance:  www.pmdalliance.org [employer] Online:  Online Education Events Sign up for emails, which are sent weekly to give you updates on programming and online offerings  Parkinson's Association of [place]:  www.parkinsonassociation.org Information on online support groups, education events, and online exercises including Yoga, Parkinson's exercises and more-LOTS of information on links to PD resources and online events Virtual Support Group through Parkinson's Association of [place]; next one is scheduled for Wednesday, June 7th at 2 pm. (These are typically scheduled for the 1st Wednesday of the month at 2 pm).  Visit website for details. Save the date for "Caring for Parkinson's-Caring for You", 9th Annual Symposium.  In-person event in [place].  September 9th.  More info on registration to come. MOVEMENT AND EXERCISE OPPORTUNITIES PWR!  Moves Classes at [facility].  Wednesdays 10 and 11   am.   Contact Amy Marriott, PT [email or web address] if interested. NEW PWR! Moves Class offering at [employer].  Wednesdays 1-2 pm, starting April 12th.  Contact Susan Laney, Fitness Manager at [employer].  [email or web address] Here is a link to the PWR!Moves classes on Zoom from [place] Parkinson's Foundation - Daily Mon-Sat at 10:00. Via Zoom, FREE and open to all.  There is also a link below via Facebook if you use that platform.  https://www.parkinsonsmi.org/mpf-programs/exercise-and-movement-activities https://www.facebook.com/ParkinsonsMI.org/posts/pwr-moves-exercise-class-parkinson-wellness-recovery-online-with-angee-ludwa-pt-/10156827878021813/  Parkinson's Wellness Recovery (PWR! Moves)  www.pwr4life.org Info on the PWR! Virtual Experience:  You will have access to our expertise through self-assessment, guided plans that start with the PD-specific fundamentals, educational content, tips, Q&A with an expert, and a growing library of PD-specific pre-recorded and live exercise classes of varying types and intensity - both physical and cognitive! If that is not enough, we offer 1:1 wellness consultations (in-person or virtual) to personalize your PWR! Virtual Experience.  Parkinson Foundation Fitness Fridays:  As part of the PD Health @ Home program, this free video series focuses each week on one aspect of fitness designed to support people living with Parkinson's.  These weekly videos highlight the Parkinson Foundation recent fitness guidelines for people with Parkinson's disease. www.parkinson.org/resources-support/online-education/pdhealth#ff Dance for PD website is offering free, live-stream classes throughout the week, as well as links to digital library of classes:  https://danceforparkinsons.org/ Virtual dance and Pilates for Parkinson's classes: Click on the Community Tab> Parkinson's Movement Initiative  Tab.  To register for classes and for more information, visit www.americandancefestival.org and click the "community" tab.  [employer] Parkinson's Cycling Classes  [employer]:  Thursdays @ Noon-Live classes at [employer] (Contact Margaret Hazen at [email or web address] or [phone]) [employer]: Virtual Classes Mondays and Thursdays /Live classes Tuesday, Wednesday and Thursday (contact Marlee at [email or web address]  or [phone]) [place] [employer] Varied levels of classes are offered Tuesdays and Thursdays at [employer].  Stretching with [place] weekly class is also offered for people with Parkinson's To observe a class or for more information, call [phone] or email Hillary Savage at [email or web address] ADDITIONAL SUPPORT AND RESOURCES [facility]:Online Caregiver Education Opportunities:  www.well-springsolutions.org/caregiver-education/caregiver-support-group.  You may also contact Jodi Kolada at [email or web address] or [phone].    [facility] Navigator:  Just1Navigator program, a free service to help individuals and families through the journey of determining care for older adults.  The "Navigator" is a social worker, Nicole Reynolds, who will speak with a prospective client and/or loved ones to provide an assessment of the situation and a set of recommendations for a personalized care plan -- all free of charge, and whether [facility] [employer] offers the needed service or not. If the need is not a service we provide, we are well-connected with reputable programs in town that we can refer you to.  www.well-springsolutions.org or to speak with the Navigator, call [phone]. Family Caregiver Programming in June:  [employer], Thursday, June 15th 11-12:30 at [employer], [place].  Call [phone] to register  

## 2021-12-10 ENCOUNTER — Ambulatory Visit: Payer: Medicare PPO | Admitting: Psychiatry

## 2021-12-17 ENCOUNTER — Ambulatory Visit: Payer: Medicare PPO | Admitting: Neurology

## 2021-12-17 ENCOUNTER — Telehealth: Payer: Self-pay | Admitting: Neurology

## 2021-12-17 ENCOUNTER — Encounter: Payer: Self-pay | Admitting: Neurology

## 2021-12-17 NOTE — Telephone Encounter (Signed)
Pt called in stating she has been having burning in her feet. She states it's getting pretty bad. She is supposed to have an MRI done on Sunday. She is afraid she won't be able to be still for it. She is not sure what she should do about it.

## 2021-12-18 ENCOUNTER — Telehealth: Payer: Self-pay | Admitting: Adult Health

## 2021-12-18 ENCOUNTER — Encounter: Payer: Self-pay | Admitting: Adult Health

## 2021-12-18 ENCOUNTER — Ambulatory Visit: Payer: Medicare PPO | Admitting: Adult Health

## 2021-12-18 DIAGNOSIS — F411 Generalized anxiety disorder: Secondary | ICD-10-CM

## 2021-12-18 DIAGNOSIS — F418 Other specified anxiety disorders: Secondary | ICD-10-CM

## 2021-12-18 MED ORDER — MIRTAZAPINE 15 MG PO TABS: 15.0000 mg | ORAL_TABLET | Freq: Every day | ORAL | 5 refills | Status: DC

## 2021-12-18 MED ORDER — DIAZEPAM 2 MG PO TABS: ORAL_TABLET | ORAL | 0 refills | Status: DC

## 2021-12-18 NOTE — Telephone Encounter (Signed)
Rx sent to pharmacy on file for Valium 2mg : take 1 tablet 30 mins prior to MRI. May take second dose if needed. Thanks

## 2021-12-18 NOTE — Progress Notes (Signed)
Morgan Reese [id number] [birth year] 71 y.o.  Subjective:   Patient ID:  Morgan Reese is a 71 y.o. (DOB 1950/12/01) female.  Chief Complaint: No chief complaint on file.   HPI Morgan Reese presents to the office today for follow-up of  anxiety disorder.    Describes mood today as "concerned". Pleasant. Denies tearfulness. Mood symptoms - reports depression and anxiety. Feels irritable at times. Reports worry and rumination. Reports obsessions. Stating "I'm struggling - feels vunerable". Recently seen by Neurology - Lurena Joiner Tat - Parkinson's. Willing to consider Remeron as an alternative to Xanax 0.25 - only takes at bedtime. Varying interest and motivation. Taking medications as prescribed.  Energy levels lower. Active, has a regular exercise routine currently. Walking. Enjoys some usual interests and activities. Married - lives with husband. Has 2 adult children. Spending time with family.  Appetite adequate. Weight loss - 120 from 123 pounds. Sleeps well most nights. Averages 6 to 8 hours of broken sleep.  Focus and concentration stable. Completing tasks. Managing aspects of household. Retired. Denies SI or HI.  Denies AH or VH. Denies paranoia.   Previous medication trials: Prozac   Flowsheet Row ED from 12/03/2021 in [facility] ED from 06/13/2021 in [facility] ED to Hosp-Admission (Discharged) from 03/10/2021 in [facility] 5 EAST MEDICAL UNIT  C-SSRS RISK CATEGORY No Risk No Risk No Risk        Review of Systems:  Review of Systems  Musculoskeletal:  Negative for gait problem.  Neurological:  Negative for tremors.  Psychiatric/Behavioral:         Please refer to HPI    Medications: I have reviewed the patient's current medications.  Current Outpatient Medications  Medication Sig Dispense Refill   mirtazapine (REMERON) 15 MG tablet Take 1 tablet (15 mg total) by mouth at bedtime.  30 tablet 5   Ascorbic Acid (VITAMIN C) 1000 MG tablet Take 1,000 mg by mouth daily.     carbidopa-levodopa (SINEMET IR) 25-100 MG tablet Take 0.5 tablets by mouth 3 (three) times daily. (Patient not taking: Reported on 12/08/2021) 45 tablet 3   Cholecalciferol (VITAMIN D3 PO) Take by mouth. Alternates 10,000 units with 5,000 units each day.     diltiazem (TIAZAC) 120 MG 24 hr capsule Take 120 mg by mouth daily.     estradiol (ESTRACE) 0.1 MG/GM vaginal cream Place 1 Applicatorful vaginally at bedtime.     estradiol (VIVELLE-DOT) 0.0375 MG/24HR Place 1 patch onto the skin 2 (two) times a week.     levothyroxine (SYNTHROID) 50 MCG tablet Take 50 mcg by mouth daily before breakfast.     Magnesium Citrate 200 MG TABS See admin instructions.     melatonin 3 MG TABS tablet Take 6-9 mg by mouth at bedtime.     metoprolol tartrate (LOPRESSOR) 25 MG tablet Take 1 tablet (25 mg total) by mouth daily as needed. (Patient taking differently: Take 25 mg by mouth 2 (two) times daily. 50 MG TOTAL) 10 tablet 0   Naltrexone HCl, Pain, 1.5 MG CAPS Take by mouth 2 (two) times daily.     Omega-3 Fatty Acids (FISH OIL) 1000 MG CAPS Take 1 capsule by mouth at bedtime.     progesterone (PROMETRIUM) 200 MG capsule Take 200 mg by mouth daily.     pyridOXINE (B-6) 50 MG tablet Take 50 mg by mouth daily.     thiamine 100 MG tablet Take 100 mg by mouth daily.  Vitamin D, Ergocalciferol, (DRISDOL) 50000 UNITS CAPS capsule Take 50,000 Units by mouth every 7 (seven) days. (Patient not taking: Reported on 12/08/2021)     VITAMIN K PO Take by mouth daily. MK7     zinc gluconate 50 MG tablet Take 50 mg by mouth daily.     No current facility-administered medications for this visit.    Medication Side Effects: None  Allergies:  Allergies  Allergen Reactions   Amoxicillin Swelling    REACTION: Head felt over-heated Other reaction(s): Other (See Comments) REACTION: Head felt over-heated    Serotonin Reuptake Inhibitors  (Ssris) Other (See Comments)    Other reaction(s): hyponatremia SSRI's are contraindicated, causes hyponatremia     Past Medical History:  Diagnosis Date   Hypertension    Parkinson's disease (HCC)    Sinus congestion    Thyroid disease     Past Medical History, Surgical history, Social history, and Family history were reviewed and updated as appropriate.   Please see review of systems for further details on the patient's review from today.   Objective:   Physical Exam:  There were no vitals taken for this visit.  Physical Exam Constitutional:      General: She is not in acute distress. Musculoskeletal:        General: No deformity.  Neurological:     Mental Status: She is alert and oriented to person, place, and time.     Coordination: Coordination normal.  Psychiatric:        Attention and Perception: Attention and perception normal. She does not perceive auditory or visual hallucinations.        Mood and Affect: Mood normal. Mood is not anxious or depressed. Affect is not labile, blunt, angry or inappropriate.        Speech: Speech normal.        Behavior: Behavior normal.        Thought Content: Thought content normal. Thought content is not paranoid or delusional. Thought content does not include homicidal or suicidal ideation. Thought content does not include homicidal or suicidal plan.        Cognition and Memory: Cognition and memory normal.        Judgment: Judgment normal.     Comments: Insight intact     Lab Review:     Component Value Date/Time   NA 134 (L) 12/03/2021 1221   K 4.1 12/03/2021 1221   CL 100 12/03/2021 1221   CO2 25 12/03/2021 1221   GLUCOSE 100 (H) 12/03/2021 1221   BUN 18 12/03/2021 1221   CREATININE 0.67 12/03/2021 1221   CALCIUM 9.7 12/03/2021 1221   PROT 6.6 12/03/2021 1221   ALBUMIN 4.4 12/03/2021 1221   AST 17 12/03/2021 1221   ALT 19 12/03/2021 1221   ALKPHOS 53 12/03/2021 1221   BILITOT 0.6 12/03/2021 1221   GFRNONAA >60  12/03/2021 1221       Component Value Date/Time   WBC 7.3 12/03/2021 1221   RBC 4.68 12/03/2021 1221   HGB 14.4 12/03/2021 1221   HCT 42.7 12/03/2021 1221   PLT 209 12/03/2021 1221   MCV 91.2 12/03/2021 1221   MCH 30.8 12/03/2021 1221   MCHC 33.7 12/03/2021 1221   RDW 12.8 12/03/2021 1221   LYMPHSABS 0.8 12/03/2021 1221   MONOABS 0.5 12/03/2021 1221   EOSABS 0.1 12/03/2021 1221   BASOSABS 0.1 12/03/2021 1221    No results found for: "POCLITH", "LITHIUM"   No results found for: "PHENYTOIN", "PHENOBARB", "VALPROATE", "CBMZ"   .  res Assessment: Plan:    Plan:  PDMP reviewed  Discontinue Xanax 0.25mg  TID - taking one daily.  Add Remeron 15mg  - 1/2 tab at hs x 7 days, then one tablet at bedtime.  Discussed potential benefits, risk, and side effects of benzodiazepines to include potential risk of tolerance and dependence, as well as possible drowsiness. Advised patient not to drive if experiencing drowsiness and to take lowest possible effective dose to minimize risk of dependence and tolerance.   Time spent with patient was 25 minutes. Greater than 50% of face to face time with patient was spent on counseling and coordination of care.    RTC 4 weeks   Diagnoses and all orders for this visit:  Generalized anxiety disorder -     mirtazapine (REMERON) 15 MG tablet; Take 1 tablet (15 mg total) by mouth at bedtime.  Anxiety about health     Please see After Visit Summary for patient specific instructions.  Future Appointments  Date Time Provider Department Center  12/20/2021  9:00 AM GI-315 MR 1 GI-315MRI [facility]  12/31/2021  1:00 PM 03/02/2022, LCSW CP-CP None  02/23/2022 12:45 PM 04/25/2022 K, DO [facility] None  03/03/2022  9:20 AM Croitoru, 05/03/2022, MD [facility]  04/12/2022  1:00 PM Tat, 04/14/2022, DO [facility] None    No orders of the defined types were placed in this encounter.   -------------------------------

## 2021-12-18 NOTE — Telephone Encounter (Signed)
Called patient and informed her to please have a driver to and from her MRI when she takes that Valium. Patient stated her husband will be driving. Patient thanked me for the call.

## 2021-12-18 NOTE — Telephone Encounter (Signed)
Called and informed patient that Valium was sent and directions to take 30 mins prior to Mri. Patient verbalized understanding and had no further questions or concerns.

## 2021-12-18 NOTE — Telephone Encounter (Signed)
Called patient and informed her of Dr. Don Perking recommendations per below:  1.  Do we need to give her small amount of valium to hold still for MRI?  2.  We didn't really talk about paresthesias in feet but emg should evaluate that.  Maybe front staff can move from Dr. Eliane Decree emg schedule to Dr. Adaline Sill if he's not booked out as far for it.   Patient would like to have a Valium sent to her pharmacy for her MRI that is on Sunday. Also patient is aware that we will have someone from the front give her a call to see if we can move her EMG up sooner. Patient had no further questions or concerns.

## 2021-12-18 NOTE — Telephone Encounter (Signed)
Pt called back in after visit today. The package insert for Remeron states to not mix with any parkinsons meds. She is not on them now, but may be prescribed them in the future. Should she still take the Remeron?

## 2021-12-20 ENCOUNTER — Telehealth: Payer: Self-pay | Admitting: Psychiatry

## 2021-12-20 ENCOUNTER — Ambulatory Visit
Admission: RE | Admit: 2021-12-20 | Discharge: 2021-12-20 | Disposition: A | Discharge status: Home / Self Care | Payer: Medicare PPO | Source: Ambulatory Visit | Attending: Neurology | Admitting: Neurology

## 2021-12-20 DIAGNOSIS — M545 Low back pain, unspecified: Secondary | ICD-10-CM

## 2021-12-20 DIAGNOSIS — M21372 Foot drop, left foot: Secondary | ICD-10-CM

## 2021-12-20 DIAGNOSIS — M4316 Spondylolisthesis, lumbar region: Secondary | ICD-10-CM | POA: Diagnosis not present

## 2021-12-20 NOTE — Telephone Encounter (Signed)
Received after hours call from pt. She reports that she took Mirtazapine 1/2 of a 15 mg tab last night and reports that she has had a severe tremor, dizziness, and dry mouth. She reports h/o Parkinsonism and that her current tremor is different- "I've not felt this way before." Advised pt to stop Remeron. Will relay information to her usual provider Yvette Rack, DNP.

## 2021-12-21 ENCOUNTER — Other Ambulatory Visit: Payer: Self-pay | Admitting: Adult Health

## 2021-12-21 DIAGNOSIS — F411 Generalized anxiety disorder: Secondary | ICD-10-CM

## 2021-12-21 MED ORDER — ESCITALOPRAM OXALATE 5 MG PO TABS: ORAL_TABLET | ORAL | 2 refills | Status: DC

## 2021-12-21 NOTE — Telephone Encounter (Signed)
Ok so leave off the Remeron for now.

## 2021-12-21 NOTE — Telephone Encounter (Signed)
Called and spoke with patient about adding a low dose of Lexapro. Sending in 5mg  - starting with 1/2 tablet daily. Labs scheduled in 2 weeks.

## 2021-12-21 NOTE — Telephone Encounter (Signed)
Please see message. I had talked to her this morning, but this is a message since then.

## 2021-12-21 NOTE — Telephone Encounter (Signed)
Noted  

## 2021-12-21 NOTE — Telephone Encounter (Signed)
Please follow up with patient this morning - not usual side effects for 1/2 dose.

## 2021-12-21 NOTE — Telephone Encounter (Signed)
Patient said she is okay so far today. Yesterday she was anxious in the morning but in the afternoon she was better. She said she had a few Xanax left and did not take one yesterday. She said she was anxious, jerky, and agitated, but that it did go away later in the afternoon. She said she needs something for anxiety but sodium loss is a "big impediment" and it has been difficult finding a medication she can take. She said the anxiety hits at various times and has even woken her up. She said it seems when she gets focused and moves around the anxiety is better. 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 She also sees Rockne Menghini for therapy.

## 2021-12-21 NOTE — Telephone Encounter (Signed)
Morgan Reese called this morning at 10:56 wanting to talk with Almira Coaster about prescribing a new medication.  She indicated that a new medication would be prescribed to replace the Remeron.  But she wants to discuss which medication before anything is prescribed.  She Almira Coaster assured her she would call her when requested and she would like to talk with her about this.

## 2021-12-21 NOTE — Telephone Encounter (Signed)
Patient said she is okay so far today. Yesterday she was anxious in the morning but in the afternoon she was better. She said she had a few Xanax left and did not take one yesterday. She said she was anxious, jerky, and agitated, but that it did go away later in the afternoon. She said she needs something for anxiety but sodium loss is a "big impediment" and it has been difficult finding a medication she can take. She said the anxiety hits at various times and has even woken her up. She said it seems when she gets focused and moves around the anxiety is better. 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 She also sees Debbie Dowd for therapy.  

## 2021-12-22 ENCOUNTER — Telehealth: Payer: Self-pay | Admitting: Adult Health

## 2021-12-22 DIAGNOSIS — H35033 Hypertensive retinopathy, bilateral: Secondary | ICD-10-CM | POA: Diagnosis not present

## 2021-12-22 NOTE — Telephone Encounter (Signed)
Pt called and asked for gina to call her back. She said that she has a question about the new medication she is taking. Please call her at [phone]

## 2021-12-22 NOTE — Telephone Encounter (Signed)
PT asked for me to call back this afternoon

## 2021-12-22 NOTE — Telephone Encounter (Signed)
Pt informed

## 2021-12-22 NOTE — Telephone Encounter (Signed)
Yes, let's continue and see how she does.

## 2021-12-22 NOTE — Telephone Encounter (Signed)
Pt stated you started her on lexapro and told her if she has diarrhea to stop med.She stated she had soft stools (but not runny) but also mentioned she takes magnesium due to her parkinson's disease.She has not had any side effects and wants to know if she can continue med because she does want to still take it.

## 2021-12-24 ENCOUNTER — Other Ambulatory Visit: Payer: Self-pay | Admitting: Behavioral Health

## 2021-12-24 ENCOUNTER — Other Ambulatory Visit: Payer: Medicare PPO

## 2021-12-24 NOTE — Progress Notes (Signed)
Morgan Reese called yesterday to after hours answering service about 715 pm. Says that she is worried about her sodium because she is more irritable. Only been on Lexapro 2.5 mg for 3 days. I told her that most like would not change her sodium levels in 3 days and not the cause for irritability. Encouraged her to continue the medication. Contact her PCP with sodium level concerns or if she was experiencing confusion ETC.  I encouraged her to call back during normal business hours with any other concerns. She agreed.

## 2021-12-28 ENCOUNTER — Telehealth: Payer: Self-pay | Admitting: Adult Health

## 2021-12-28 NOTE — Telephone Encounter (Signed)
She has only taken for a week - let's give it more time. The Lexapro should help with the anxiety. She is also scheduled for a lab to check her sodium levels correct?

## 2021-12-28 NOTE — Telephone Encounter (Signed)
Next visit is 01/13/22. Morgan Reese was changed from Xanax to Lexapro and has noticed she's having more anxiety and having trouble going back to sleep. She feels it "is back firing on her". She also states that she is having trouble focusing and wonders if this is coming from her Parkinson's that she has? She would like a phone call back at [phone].

## 2021-12-28 NOTE — Telephone Encounter (Signed)
Pt stated she switched from mirtazapine to lexapro and she is having a lot of anxiety.The side effects have worn off,but she has a feeling in her chest that she didn't have before and stated it is anxiety.She is also having trouble sleeping

## 2021-12-29 ENCOUNTER — Ambulatory Visit: Payer: Medicare PPO | Admitting: Adult Health

## 2021-12-29 DIAGNOSIS — E039 Hypothyroidism, unspecified: Secondary | ICD-10-CM | POA: Diagnosis not present

## 2021-12-29 DIAGNOSIS — F419 Anxiety disorder, unspecified: Secondary | ICD-10-CM | POA: Diagnosis not present

## 2021-12-29 DIAGNOSIS — I10 Essential (primary) hypertension: Secondary | ICD-10-CM | POA: Diagnosis not present

## 2021-12-29 DIAGNOSIS — E871 Hypo-osmolality and hyponatremia: Secondary | ICD-10-CM | POA: Diagnosis not present

## 2021-12-29 NOTE — Telephone Encounter (Signed)
Let's go ahead up to the 5mg  for now and we will see how labs look next week. How many total hours of sleep is she getting a day? Is the lack of sleep related to worrying?

## 2021-12-29 NOTE — Telephone Encounter (Signed)
Pt stated she is going to a doctor next week for the sodium.She is only taking 1/2 tab of lexapro and wanted to know if she should increase to a whole tablet on Monday.She said sleep is also a concern.She did not sleep last night she woke up at 12 am.She tried taking 1/2 xanax with the lexapro but was not sure if this was safe.It did not help either.

## 2021-12-29 NOTE — Telephone Encounter (Signed)
The Lexapro should not be effecting her sleep.

## 2021-12-29 NOTE — Telephone Encounter (Signed)
Pt stated last night she slept for only 2 hours.She has no problem falling asleep but she woke up and never fell back asleep.She does think anxiety is contributing but is not sure if the med is as well because she takes it in the morning

## 2021-12-29 NOTE — Telephone Encounter (Signed)
Continue 1/2 tab or increase to the whole tab?And she still wants something to help with sleep

## 2021-12-29 NOTE — Telephone Encounter (Signed)
Have her continue the Lexapro for now if no side effects.

## 2021-12-31 ENCOUNTER — Ambulatory Visit: Payer: Medicare PPO | Admitting: Psychiatry

## 2021-12-31 ENCOUNTER — Ambulatory Visit: Payer: Medicare PPO | Admitting: Adult Health

## 2021-12-31 DIAGNOSIS — F411 Generalized anxiety disorder: Secondary | ICD-10-CM

## 2021-12-31 NOTE — Progress Notes (Signed)
[facility] Counselor/Therapist Progress Note  Patient ID: Morgan Reese, MRN: [id number],    Date: 12/31/2021  Time Spent: 50 minutes   Treatment Type: Individual Therapy  Reported Symptoms: anxiety, depression, "and it's worse when I'm not feeling well physically", hard not to assume the negative  Mental Status Exam:  Appearance:   Neat     Behavior:  Appropriate, Sharing, and Motivated  Motor:  Normal  Speech/Language:   Clear and Coherent  Affect:  Depressed and anxious  Mood:  anxious and depressed  Thought process:  goal directed  Thought content:    Some obsessiveness thoughts  Sensory/Perceptual disturbances:    WNL  Orientation:  oriented to person, place, time/date, situation, day of week, month of year, year, and stated date of December 31, 2021  Attention:  Fair  Concentration:  Fair  Memory:  Some "short term memory issues noticed yesterday" and is sharing this with her Dr  Effie Shy of knowledge:   Good  Insight:    Good and Fair  Judgment:   Good  Impulse Control:  Good   Risk Assessment: Danger to Self:  No Self-injurious Behavior: No Danger to Others: No Duty to Warn:no Physical Aggression / Violence:No  Access to Firearms a concern: No  Gang Involvement:No   Subjective: Patient today reporting anxiety and depression mostly related to physical issues of "my Parkinsonion symptoms".  States her next Dr appt is in October. Shares she is supposed to get off her Xanax and get onto an antidepressant. 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 But nobody else in family seems to know how. Discussed ways that she can let family and others know how that can be helpful and especially in what she needs.  Frustrated with her going to gym as "I can't do so of the things I used to do." Some sadness expressed and processed together.  Determination but not as self-assured, understandably at this time.  Stated "I need to be more loving  towards myself", and we discussed some very specific examples of what would feel more loving towards her from her family and friends, and also encouraged her being able to ask for what she needs, explaining that everybody does not pick up on the needs of other people but that does not mean they do not want to help.  Frustrated at night "having all the answers because I want to know answers and I am a planner".  Worked with her frustration some and looked at the things she can do versus cannot do at this point when she does not "have all the answers".  Interventions: Cognitive Behavioral Therapy, Solution-Oriented/Positive Psychology, and Ego-Supportive  Treatment goals: Treatment goals remain on treatment plan as patient works with strategies to achieve her goals. Progress is assessed each session and noted in "subject" and /or "plan" sections of treatment note. Long-term goal: Reduce overall level, frequncy, and intensity of the anxiety so that daily functioning is not impaired.  Short-term goal: Increase understanding of beliefs and messages that produces the worry and anxiety.  Describe current and past experiences with specific fears, prominent worries, and anxiety symptoms including their impact on functioning and attempts to resolve it.  Strategies: Explore cognitive messages that mediate anxiety response and retrain in adaptive cognitions.  Diagnosis:   ICD-10-CM   1. Generalized anxiety disorder  F41.1      Plan: Patient in session today participating well and showing good motivation  despite her concerns about "uncertainties and unknowns".  As acknowledged in the "subject" section above in this note.  She was more open today especially with her frustrations and some anger and that actually helped in guiding our conversation to where it needed to be.  Good participation in talking about the "difficult issues and her current worries".  Encouraged her reaching out to people to help support  her, trying not to jump to conclusions and make worst case scenario assumptions, believing more in the present versus focusing on the past and too far into the future, and practicing some deep breathing exercises that can help with some of her anxiety.  Had some significant feelings about family and needing more support so suggested to her that she asked them more directly rather than expecting them to know, and she is to follow-up on this. Encouraged patient in practicing more positive behaviors as discussed in sessions including staying in the present and focusing on what she can change, getting outside some daily and walking or going to the gym, being more careful as she walks to prevent falling, staying in touch with people who are supportive, trying not to look ahead and imagine worst case scenarios, healthy nutrition and exercise, positive self talk, reduce overthinking and over analyzing, allow her faith to be an emotional support as well as spiritual, and recognize the strength she shows in working with goal directed behaviors to move in a direction that supports her improved emotional health and overall wellbeing.  Goal review and progress/challenges noted with patient.  Next appointment within 3 weeks.  This record has been created using [employer].  Chart creation errors have been sought, but may not always have been located and corrected.  Such creation errors do not reflect on the standard of medical care provided.   Mathis Fare, LCSW

## 2021-12-31 NOTE — Telephone Encounter (Signed)
We are trying to get away from the Xanax bc of the Parkinson's and onto something that will help with the anxiety. She can continue the Lexapro at 5mg  until sodium level drawn or leave it off. No other changes until seen at next appt.

## 2021-12-31 NOTE — Telephone Encounter (Signed)
Pt is not sure about stopping lexapro because she feels like it will make things worse.I told her the only reason you told her to stop is due to the side effects she has been calling about.She asked if she could continue taking xanax as needed

## 2021-12-31 NOTE — Telephone Encounter (Signed)
The increase shouldn't be causing the symptoms. Just have her stop it and we can discuss other options after sodium level checked.

## 2021-12-31 NOTE — Telephone Encounter (Signed)
LVM with info and to rtc with any questions  

## 2022-01-01 ENCOUNTER — Telehealth: Payer: Self-pay | Admitting: Neurology

## 2022-01-01 DIAGNOSIS — R251 Tremor, unspecified: Secondary | ICD-10-CM | POA: Diagnosis not present

## 2022-01-01 NOTE — Telephone Encounter (Signed)
Called patient and left voicemail.

## 2022-01-01 NOTE — Telephone Encounter (Signed)
Patient is wanting to know if her thyroid medication can increase her tremors?  Medication is levothyroxine.

## 2022-01-01 NOTE — Telephone Encounter (Signed)
Patient called back.  She said she went to a walk in clinic and got her question answered.  She doesn't need a return phone call.

## 2022-01-04 NOTE — Telephone Encounter (Signed)
Yes she did.She said it went down 1 point,she asked for the results and they told her the dr still had to review it.Im going to call them in the morning at [facility]  to see if they have reviewed it so they can fax it to [place]

## 2022-01-04 NOTE — Telephone Encounter (Signed)
Did she have her level drawn - I can't see it.

## 2022-01-04 NOTE — Telephone Encounter (Signed)
Ok ty 

## 2022-01-05 NOTE — Telephone Encounter (Signed)
Spoke to lab,they are going to fax results

## 2022-01-05 NOTE — Telephone Encounter (Signed)
Ok ty 

## 2022-01-05 NOTE — Telephone Encounter (Signed)
Labs are in your box

## 2022-01-05 NOTE — Progress Notes (Signed)
Page one was just a fax cover sheet.

## 2022-01-05 NOTE — Progress Notes (Signed)
Is page one available?

## 2022-01-06 NOTE — Telephone Encounter (Signed)
PCP told her if she has anymore problems with med to stop.When she increased to a full 5 mg tab side effects got worse.Constipation, increased thirst,and anxiety have all gotten worse.She stated "I feel like a volcano will erupt under my skin,my heart feels like it's beating out my chest." She is not sure if stopping med will make things worse but her anxiety is very high.

## 2022-01-06 NOTE — Telephone Encounter (Signed)
Pt informed

## 2022-01-06 NOTE — Telephone Encounter (Signed)
Have her leave it off.

## 2022-01-06 NOTE — Telephone Encounter (Signed)
I saw the lab - is the medication helpful? Does her PCP feel like it's safe to contonie if so?

## 2022-01-07 ENCOUNTER — Telehealth: Payer: Self-pay | Admitting: Neurology

## 2022-01-07 NOTE — Telephone Encounter (Signed)
Called patient and she stated that the lexapro she is on making her sodium low and her tremors worse. I asked patient who prescribes this medication because we do not here at our office prescribe her Lexapro.Patient is going to [facility] for this medication. She stated she felt that Dr. Arbutus Leas wanted her to continue to try different anti anxiety medications. I did tell patient that ultimately it is her decision with her psychiatrist what medications she continues to try but in Dr. Iona Beard notes she did want her off of benzodiazepines and Xanax because of the effects with her age group as well as with her Parkinson's. Patient said she is down to 1/2 a Xanax at night and is feeling better about that. I told patient I would send these notes to Dr. Arbutus Leas for her Recommendations but that she also needs to call [facility] and speak to her psychiatrist about her issues with anti anxiety medications

## 2022-01-07 NOTE — Telephone Encounter (Signed)
Yes she stopped lexapro

## 2022-01-07 NOTE — Telephone Encounter (Signed)
So we have stopped the Lexapro correct? Phone call of if insurance covers.

## 2022-01-07 NOTE — Telephone Encounter (Signed)
Patient is having some problem anxiety / antidepressant  medication making her sodium low and tremors worst    She would like to speak to someone about it

## 2022-01-07 NOTE — Telephone Encounter (Signed)
Please check if insurance will allow pt appt to be a telephone visit.Please call and let her know because she also had questions about the check in process

## 2022-01-08 ENCOUNTER — Encounter: Payer: Self-pay | Admitting: *Deleted

## 2022-01-09 ENCOUNTER — Encounter: Payer: Self-pay | Admitting: Neurology

## 2022-01-11 NOTE — Telephone Encounter (Signed)
Pt called an informed that She will need to address all psych/anxiety concerns with psychiatry and glad she has a f/u.  Re: ibuprofen/tylenol, She  was informed that Dr Tat dose not have an objection from Parkinsons Disease standpoint, but generally Parkinsons Disease isn't a painful condition so will need to f/u PCP.

## 2022-01-11 NOTE — Telephone Encounter (Signed)
See phone note- pt called 

## 2022-01-11 NOTE — Telephone Encounter (Signed)
Pt called she stated that she is worried about SSRI and sodium loss with taken anxiety medication, she stated that the next medication psychiatry wanted to start was an antipsychotic and she stated she dose not need it. She has an appointment wed with psychiatry and will talk to them about medication then. She stated she has tried chamomile tea and that it has helped. She also wants to know if taken tylenol or ibuprofen with parkinson is ok for aches and pains?

## 2022-01-13 ENCOUNTER — Encounter: Payer: Self-pay | Admitting: Adult Health

## 2022-01-13 ENCOUNTER — Ambulatory Visit (INDEPENDENT_AMBULATORY_CARE_PROVIDER_SITE_OTHER): Payer: Medicare PPO | Admitting: Adult Health

## 2022-01-13 ENCOUNTER — Ambulatory Visit: Payer: Medicare PPO | Admitting: Neurology

## 2022-01-13 DIAGNOSIS — F411 Generalized anxiety disorder: Secondary | ICD-10-CM | POA: Diagnosis not present

## 2022-01-13 NOTE — Addendum Note (Signed)
Addended by: Dorothyann Gibbs on: 01/13/2022 01:16 PM   Modules accepted: Level of Service

## 2022-01-13 NOTE — Progress Notes (Signed)
Morgan Reese [id number] [birth year] 71 y.o.  Subjective:   Patient ID:  Morgan Reese is a 71 y.o. (DOB 26-Oct-1950) female.  Chief Complaint: No chief complaint on file.   HPI Morgan Reese presents to the office today for follow-up of anxiety disorder.    Describes mood today as "ok". Pleasant. Denies tearfulness. Mood symptoms - reports depression - "off and on". Feels irritable at times. Feels anxious a lot of the time. Reports pain in her leg - tingling and hurting - sometimes worse than others. Reports worry and rumination - "I do my fair share". Stating "I'm coping, but not comfortable". Reports thyroid dysregulation. Upcoming nerve conduction testing - October. Follows up with Dr Arbutus Leas in November. Stating "I'. Varying interest and motivation. Taking medications as prescribed.  Energy levels lower. Active, has a regular exercise routine currently. Walking. Enjoys some usual interests and activities. Married - lives with husband. Has 2 adult children. Spending time with family.  Appetite adequate. Weight loss - 120 from 123 pounds. Sleeps well most nights. Averages 6 to 8 hours of broken sleep.  Focus and concentration stable. Completing tasks. Managing aspects of household. Retired. Denies SI or HI.  Denies AH or VH. Denies paranoia.    Flowsheet Row ED from 12/03/2021 in [facility] ED from 06/13/2021 in [facility] ED to Hosp-Admission (Discharged) from 03/10/2021 in [facility] MED[facility] UN[facility]  C-SSRS RISK CATEGORY No Risk No Risk No Risk        Review of Systems:  Review of Systems  Musculoskeletal:  Negative for gait problem.  Neurological:  Negative for tremors.  Psychiatric/Behavioral:         Please refer to HPI    Medications: I have reviewed the patient's current medications.  Current Outpatient Medications  Medication Sig Dispense Refill   Ascorbic Acid (VITAMIN C) 1000  MG tablet Take 1,000 mg by mouth daily.     carbidopa-levodopa (SINEMET IR) 25-100 MG tablet Take 0.5 tablets by mouth 3 (three) times daily. (Patient not taking: Reported on 12/08/2021) 45 tablet 3   Cholecalciferol (VITAMIN D3 PO) Take by mouth. Alternates 10,000 units with 5,000 units each day.     diazepam (VALIUM) 2 MG tablet Take 1 tablet 30 minutes prior to MRI. May take second dose if needed. 2 tablet 0   diltiazem (TIAZAC) 120 MG 24 hr capsule Take 120 mg by mouth daily.     escitalopram (LEXAPRO) 5 MG tablet Take 1/2 tablet daily. 30 tablet 2   estradiol (ESTRACE) 0.1 MG/GM vaginal cream Place 1 Applicatorful vaginally at bedtime.     estradiol (VIVELLE-DOT) 0.0375 MG/24HR Place 1 patch onto the skin 2 (two) times a week.     levothyroxine (SYNTHROID) 50 MCG tablet Take 50 mcg by mouth daily before breakfast.     Magnesium Citrate 200 MG TABS See admin instructions.     melatonin 3 MG TABS tablet Take 6-9 mg by mouth at bedtime.     metoprolol tartrate (LOPRESSOR) 25 MG tablet Take 1 tablet (25 mg total) by mouth daily as needed. (Patient taking differently: Take 25 mg by mouth 2 (two) times daily. 50 MG TOTAL) 10 tablet 0   Naltrexone HCl, Pain, 1.5 MG CAPS Take by mouth 2 (two) times daily.     Omega-3 Fatty Acids (FISH OIL) 1000 MG CAPS Take 1 capsule by mouth at bedtime.     progesterone (PROMETRIUM) 200 MG capsule Take 200 mg by  mouth daily.     pyridOXINE (B-6) 50 MG tablet Take 50 mg by mouth daily.     thiamine 100 MG tablet Take 100 mg by mouth daily.     Vitamin D, Ergocalciferol, (DRISDOL) 50000 UNITS CAPS capsule Take 50,000 Units by mouth every 7 (seven) days. (Patient not taking: Reported on 12/08/2021)     VITAMIN K PO Take by mouth daily. MK7     zinc gluconate 50 MG tablet Take 50 mg by mouth daily.     No current facility-administered medications for this visit.    Medication Side Effects: None  Allergies:  Allergies  Allergen Reactions   Amoxicillin Swelling     REACTION: Head felt over-heated Other reaction(s): Other (See Comments) REACTION: Head felt over-heated    Serotonin Reuptake Inhibitors (Ssris) Other (See Comments)    Other reaction(s): hyponatremia SSRI's are contraindicated, causes hyponatremia     Past Medical History:  Diagnosis Date   Hypertension    Parkinson's disease (HCC)    Sinus congestion    Thyroid disease     Past Medical History, Surgical history, Social history, and Family history were reviewed and updated as appropriate.   Please see review of systems for further details on the patient's review from today.   Objective:   Physical Exam:  There were no vitals taken for this visit.  Physical Exam Constitutional:      General: She is not in acute distress. Musculoskeletal:        General: No deformity.  Neurological:     Mental Status: She is alert and oriented to person, place, and time.     Coordination: Coordination normal.  Psychiatric:        Attention and Perception: Attention and perception normal. She does not perceive auditory or visual hallucinations.        Mood and Affect: Mood normal. Mood is not anxious or depressed. Affect is not labile, blunt, angry or inappropriate.        Speech: Speech normal.        Behavior: Behavior normal.        Thought Content: Thought content normal. Thought content is not paranoid or delusional. Thought content does not include homicidal or suicidal ideation. Thought content does not include homicidal or suicidal plan.        Cognition and Memory: Cognition and memory normal.        Judgment: Judgment normal.     Comments: Insight intact     Lab Review:     Component Value Date/Time   NA 134 (L) 12/03/2021 1221   K 4.1 12/03/2021 1221   CL 100 12/03/2021 1221   CO2 25 12/03/2021 1221   GLUCOSE 100 (H) 12/03/2021 1221   BUN 18 12/03/2021 1221   CREATININE 0.67 12/03/2021 1221   CALCIUM 9.7 12/03/2021 1221   PROT 6.6 12/03/2021 1221   ALBUMIN 4.4  12/03/2021 1221   AST 17 12/03/2021 1221   ALT 19 12/03/2021 1221   ALKPHOS 53 12/03/2021 1221   BILITOT 0.6 12/03/2021 1221   GFRNONAA >60 12/03/2021 1221       Component Value Date/Time   WBC 7.3 12/03/2021 1221   RBC 4.68 12/03/2021 1221   HGB 14.4 12/03/2021 1221   HCT 42.7 12/03/2021 1221   PLT 209 12/03/2021 1221   MCV 91.2 12/03/2021 1221   MCH 30.8 12/03/2021 1221   MCHC 33.7 12/03/2021 1221   RDW 12.8 12/03/2021 1221   LYMPHSABS 0.8 12/03/2021 1221  MONOABS 0.5 12/03/2021 1221   EOSABS 0.1 12/03/2021 1221   BASOSABS 0.1 12/03/2021 1221    No results found for: "POCLITH", "LITHIUM"   No results found for: "PHENYTOIN", "PHENOBARB", "VALPROATE", "CBMZ"   .res Assessment: Plan:    Plan:  PDMP reviewed  D/C Lexapro - did not tolerate.  Discussed potential benefits, risk, and side effects of benzodiazepines to include potential risk of tolerance and dependence, as well as possible drowsiness. Advised patient not to drive if experiencing drowsiness and to take lowest possible effective dose to minimize risk of dependence and tolerance.   Time spent with patient was 20 minutes. Greater than 50% of face to face time with patient was spent on counseling and coordination of care.    RTC 4 weeks   There are no diagnoses linked to this encounter.   Please see After Visit Summary for patient specific instructions.  Future Appointments  Date Time Provider Department Center  01/13/2022 11:20 AM Brittie Whisnant, Thereasa Solo, NP [facility] None  01/21/2022  1:00 PM Mathis Fare, LCSW [facility] None  02/23/2022 12:45 PM Nita Sickle K, DO [facility] None  03/03/2022  9:20 AM Croitoru, Rachelle Hora, MD [facility]  04/12/2022  1:00 PM Tat, Octaviano Batty, DO [facility] None    No orders of the defined types were placed in this encounter.   -------------------------------

## 2022-01-14 DIAGNOSIS — L821 Other seborrheic keratosis: Secondary | ICD-10-CM | POA: Diagnosis not present

## 2022-01-14 DIAGNOSIS — L570 Actinic keratosis: Secondary | ICD-10-CM | POA: Diagnosis not present

## 2022-01-17 ENCOUNTER — Other Ambulatory Visit: Payer: Self-pay

## 2022-01-17 ENCOUNTER — Encounter (HOSPITAL_BASED_OUTPATIENT_CLINIC_OR_DEPARTMENT_OTHER): Payer: Self-pay | Admitting: Emergency Medicine

## 2022-01-17 ENCOUNTER — Emergency Department (HOSPITAL_BASED_OUTPATIENT_CLINIC_OR_DEPARTMENT_OTHER)
Admission: EM | Admit: 2022-01-17 | Discharge: 2022-01-17 | Disposition: A | Discharge status: Home / Self Care | Payer: Medicare PPO | Attending: Emergency Medicine | Admitting: Emergency Medicine

## 2022-01-17 DIAGNOSIS — Z20822 Contact with and (suspected) exposure to covid-19: Secondary | ICD-10-CM | POA: Diagnosis not present

## 2022-01-17 DIAGNOSIS — R590 Localized enlarged lymph nodes: Secondary | ICD-10-CM | POA: Insufficient documentation

## 2022-01-17 DIAGNOSIS — R490 Dysphonia: Secondary | ICD-10-CM | POA: Diagnosis not present

## 2022-01-17 DIAGNOSIS — R531 Weakness: Secondary | ICD-10-CM | POA: Insufficient documentation

## 2022-01-17 DIAGNOSIS — N390 Urinary tract infection, site not specified: Secondary | ICD-10-CM

## 2022-01-17 DIAGNOSIS — R000 Tachycardia, unspecified: Secondary | ICD-10-CM | POA: Diagnosis not present

## 2022-01-17 DIAGNOSIS — R5383 Other fatigue: Secondary | ICD-10-CM | POA: Diagnosis not present

## 2022-01-17 LAB — CBC
HCT: 38.2 % (ref 36.0–46.0)
Hemoglobin: 13.1 g/dL (ref 12.0–15.0)
MCH: 31.2 pg (ref 26.0–34.0)
MCHC: 34.3 g/dL (ref 30.0–36.0)
MCV: 91 fL (ref 80.0–100.0)
Platelets: 205 10*3/uL (ref 150–400)
RBC: 4.2 MIL/uL (ref 3.87–5.11)
RDW: 13.3 % (ref 11.5–15.5)
WBC: 6.6 10*3/uL (ref 4.0–10.5)
nRBC: 0 % (ref 0.0–0.2)

## 2022-01-17 LAB — URINALYSIS, ROUTINE W REFLEX MICROSCOPIC
Bilirubin Urine: NEGATIVE
Glucose, UA: NEGATIVE mg/dL
Hgb urine dipstick: NEGATIVE
Ketones, ur: 15 mg/dL — AB
Nitrite: NEGATIVE
Specific Gravity, Urine: 1.033 — ABNORMAL HIGH (ref 1.005–1.030)
WBC, UA: >50 WBC/hpf — ABNORMAL HIGH (ref 0–5)
pH: 6 (ref 5.0–8.0)

## 2022-01-17 LAB — BASIC METABOLIC PANEL
Anion gap: 7 (ref 5–15)
BUN: 25 mg/dL — ABNORMAL HIGH (ref 8–23)
CO2: 25 mmol/L (ref 22–32)
Calcium: 9.3 mg/dL (ref 8.9–10.3)
Chloride: 101 mmol/L (ref 98–111)
Creatinine, Ser: 0.61 mg/dL (ref 0.44–1.00)
GFR, Estimated: >60 mL/min (ref >60)
Glucose, Bld: 102 mg/dL — ABNORMAL HIGH (ref 70–99)
Potassium: 3.9 mmol/L (ref 3.5–5.1)
Sodium: 133 mmol/L — ABNORMAL LOW (ref 135–145)

## 2022-01-17 LAB — SARS CORONAVIRUS 2 BY RT PCR: SARS Coronavirus 2 by RT PCR: NEGATIVE

## 2022-01-17 MED ORDER — CEPHALEXIN 250 MG PO CAPS
500.0000 mg | ORAL_CAPSULE | Freq: Once | ORAL | Status: AC
  Administered 2022-01-17: 500 mg via ORAL
  Filled 2022-01-17: qty 2

## 2022-01-17 MED ORDER — CEPHALEXIN 500 MG PO CAPS: 500.0000 mg | ORAL_CAPSULE | Freq: Two times a day (BID) | ORAL | 0 refills | Status: AC

## 2022-01-17 NOTE — ED Provider Notes (Signed)
[facility] EMERGENCY DEPT Provider Note   CSN: [id number] Arrival date & time: 01/17/22  1521     History  Chief Complaint  Patient presents with   Weakness    Morgan Reese is a 71 y.o. female presented to ED with concern for generalized weakness, hoarse voice, concern for low sodium.  She reports she was hospitalized for low sodium in the past.  Her sodium was 131 on recent check.  She is on a fluid restricted diet as a result of hyponatremia.  Reports he feels generally weak.  Poor appetite.  No vomiting or diarrhea.  No headache.  She feels that her voice, she is chronically hoarse, became more hoarse recently.  HPI     Home Medications Prior to Admission medications   Medication Sig Start Date End Date Taking? Authorizing Provider  cephALEXin (KEFLEX) 500 MG capsule Take 1 capsule (500 mg total) by mouth 2 (two) times daily for 5 days. 01/17/22 01/22/22 Yes Halina Asano, Kermit Balo, MD  Ascorbic Acid (VITAMIN C) 1000 MG tablet Take 1,000 mg by mouth daily.    [provider]  carbidopa-levodopa (SINEMET IR) 25-100 MG tablet Take 0.5 tablets by mouth 3 (three) times daily. Patient not taking: Reported on 12/08/2021 10/13/21   Huston Foley, MD  Cholecalciferol (VITAMIN D3 PO) Take by mouth. Alternates 10,000 units with 5,000 units each day.    [provider]  diazepam (VALIUM) 2 MG tablet Take 1 tablet 30 minutes prior to MRI. May take second dose if needed. 12/18/21   Van Clines, MD  diltiazem [facility]) 120 MG 24 hr capsule Take 120 mg by mouth daily.    [provider]  escitalopram (LEXAPRO) 5 MG tablet Take 1/2 tablet daily. 12/21/21   Mozingo, Thereasa Solo, NP  estradiol (ESTRACE) 0.1 MG/GM vaginal cream Place 1 Applicatorful vaginally at bedtime.    [provider]  estradiol (VIVELLE-DOT) 0.0375 MG/24HR Place 1 patch onto the skin 2 (two) times a week. 04/14/21   [provider]  levothyroxine (SYNTHROID) 50 MCG  tablet Take 50 mcg by mouth daily before breakfast.    [provider]  Magnesium Citrate 200 MG TABS See admin instructions.    [provider]  melatonin 3 MG TABS tablet Take 6-9 mg by mouth at bedtime.    [provider]  metoprolol tartrate (LOPRESSOR) 25 MG tablet Take 1 tablet (25 mg total) by mouth daily as needed. Patient taking differently: Take 25 mg by mouth 2 (two) times daily. 50 MG TOTAL 01/01/21   Charlynne Pander, MD  Naltrexone HCl, Pain, 1.5 MG CAPS Take by mouth 2 (two) times daily.    [provider]  Omega-3 Fatty Acids (FISH OIL) 1000 MG CAPS Take 1 capsule by mouth at bedtime.    [provider]  progesterone (PROMETRIUM) 200 MG capsule Take 200 mg by mouth daily.    [provider]  pyridOXINE (B-6) 50 MG tablet Take 50 mg by mouth daily.    [provider]  thiamine 100 MG tablet Take 100 mg by mouth daily.    [provider]  Vitamin D, Ergocalciferol, (DRISDOL) 50000 UNITS CAPS capsule Take 50,000 Units by mouth every 7 (seven) days. Patient not taking: Reported on 12/08/2021    [provider]  VITAMIN K PO Take by mouth daily. MK7    [provider]  zinc gluconate 50 MG tablet Take 50 mg by mouth daily.    [provider]  Allergies    Amoxicillin and Serotonin reuptake inhibitors (ssris)    Review of Systems   Review of Systems  Physical Exam Updated Vital Signs BP 137/88   Pulse 81   Temp 98.3 F (36.8 C)   Resp 12   SpO2 97%  Physical Exam Constitutional:      General: She is not in acute distress. HENT:     Head: Normocephalic and atraumatic.     Comments: Bilateral cervical lymphadenopathy Eyes:     Conjunctiva/sclera: Conjunctivae normal.     Pupils: Pupils are equal, round, and reactive to light.  Cardiovascular:     Rate and Rhythm: Normal rate and regular rhythm.  Pulmonary:     Effort: Pulmonary effort is normal. No respiratory  distress.  Abdominal:     General: There is no distension.     Tenderness: There is no abdominal tenderness.  Skin:    General: Skin is warm and dry.  Neurological:     General: No focal deficit present.     Mental Status: She is alert and oriented to person, place, and time. Mental status is at baseline.  Psychiatric:        Mood and Affect: Mood normal.        Behavior: Behavior normal.     ED Results / Procedures / Treatments   Labs (all labs ordered are listed, but only abnormal results are displayed) Labs Reviewed  BASIC METABOLIC PANEL - Abnormal; Notable for the following components:      Result Value   Sodium 133 (*)    Glucose, Bld 102 (*)    BUN 25 (*)    All other components within normal limits  URINALYSIS, ROUTINE W REFLEX MICROSCOPIC - Abnormal; Notable for the following components:   Specific Gravity, Urine 1.033 (*)    Ketones, ur 15 (*)    Protein, ur TRACE (*)    Leukocytes,Ua MODERATE (*)    WBC, UA >50 (*)    Bacteria, UA MANY (*)    All other components within normal limits  SARS CORONAVIRUS 2 BY RT PCR  URINE CULTURE  CBC    EKG EKG Interpretation  Date/Time:  Sunday January 17 2022 15:31:38 EDT Ventricular Rate:  103 PR Interval:  92 QRS Duration: 68 QT Interval:  288 QTC Calculation: 377 R Axis:   52 Text Interpretation: Sinus tachycardia with short PR When compared with ECG of 03-Dec-2021 12:40, PREVIOUS ECG IS PRESENT Confirmed by Alvester Chou [phone]) on 01/17/2022 5:25:31 PM  Radiology No results found.  Procedures Procedures    Medications Ordered in ED Medications  cephALEXin (KEFLEX) capsule 500 mg (has no administration in time range)    ED Course/ Medical Decision Making/ A&P                           Medical Decision Making Amount and/or Complexity of Data Reviewed Labs: ordered.  Risk Prescription drug management.   This patient presents to the ED with concern for generalized weakness, hoarse voice. This  involves an extensive number of treatment options, and is a complaint that carries with it a high risk of complications and morbidity.  The differential diagnosis includes electrolyte derangement versus viral URI versus other  I ordered and personally interpreted labs.  The pertinent results include: Sodium 133; no acute anemia.  UA shows moderate leukocytes, many bacteria and white blood cells, negative nitrites.  Possibility of UTI?Marland Kitchen  We will add on a  urine culture.   The patient was maintained on a cardiac monitor.  I personally viewed and interpreted the cardiac monitored which showed an underlying rhythm of: Sinus rhythm  Per my interpretation the patient's ECG shows normal sinus rhythm no acute ischemic findings  I ordered medication including Keflex for possible UTI  Test Considered: Low suspicion for acute PE, ACS, sepsis  We could also do a COVID test with her hoarse voice.  Dispostion:  After consideration of the diagnostic results and the patients response to treatment, I feel that the patent would benefit from outpatient PCP follow-up.  Explained to the patient that we cannot perform an exhaustive work-up in the ED.  She will follow-up with her PCP.         Final Clinical Impression(s) / ED Diagnoses Final diagnoses:  Other fatigue  Hoarseness of voice  Urinary tract infection without hematuria, site unspecified    Rx / DC Orders ED Discharge Orders          Ordered    cephALEXin (KEFLEX) 500 MG capsule  2 times daily        01/17/22 1945              Terald Sleeper, MD 01/17/22 1946

## 2022-01-17 NOTE — ED Triage Notes (Signed)
Pt states today and yesterday feels very weak, worried about her sodium, it has been 131 a few weeks ago. Pt feels short of breath today.

## 2022-01-17 NOTE — ED Triage Notes (Signed)
Pt quit lexapro 2 weeks ago due to low sodium

## 2022-01-17 NOTE — Discharge Instructions (Addendum)
Please follow-up with your primary care doctor's office.  Your COVID results should be available by tomorrow.  You can check on your patient portal.  We started treating you for a possible urine infection today on your urinalysis.  Your actual urine culture will take a few days to result to confirm whether this is truly an infection.  As explained, further work-up should happen for your doctors office.  There are many tests and medical conditions that can be worked up as an outpatient and are not typically done in the ER.  Talk to your doctor's office about your thyroid function or testing or any other concerns.

## 2022-01-20 DIAGNOSIS — F411 Generalized anxiety disorder: Secondary | ICD-10-CM | POA: Diagnosis not present

## 2022-01-20 DIAGNOSIS — F5101 Primary insomnia: Secondary | ICD-10-CM | POA: Diagnosis not present

## 2022-01-20 DIAGNOSIS — G4739 Other sleep apnea: Secondary | ICD-10-CM | POA: Diagnosis not present

## 2022-01-20 DIAGNOSIS — R2681 Unsteadiness on feet: Secondary | ICD-10-CM | POA: Diagnosis not present

## 2022-01-20 DIAGNOSIS — N3000 Acute cystitis without hematuria: Secondary | ICD-10-CM | POA: Diagnosis not present

## 2022-01-20 DIAGNOSIS — G4719 Other hypersomnia: Secondary | ICD-10-CM | POA: Diagnosis not present

## 2022-01-20 DIAGNOSIS — G252 Other specified forms of tremor: Secondary | ICD-10-CM | POA: Diagnosis not present

## 2022-01-20 DIAGNOSIS — E038 Other specified hypothyroidism: Secondary | ICD-10-CM | POA: Diagnosis not present

## 2022-01-20 DIAGNOSIS — G20 Parkinson's disease: Secondary | ICD-10-CM | POA: Diagnosis not present

## 2022-01-20 LAB — URINE CULTURE: Culture: >=100000 — AB

## 2022-01-21 ENCOUNTER — Telehealth: Payer: Self-pay | Admitting: *Deleted

## 2022-01-21 ENCOUNTER — Ambulatory Visit: Payer: Medicare PPO | Admitting: Psychiatry

## 2022-01-21 NOTE — Telephone Encounter (Signed)
Post ED Visit - Positive Culture Follow-up: Successful Patient Follow-Up  Culture assessed and recommendations reviewed by:  []  , Pharm.D. []  Enzo Bi, Pharm.D., BCPS AQ-ID []  , Pharm.D., BCPS []  Celedonio Miyamoto, Pharm.D., BCPS []  [place], Garvin Fila.D., BCPS, AAHIVP []  , Pharm.D., BCPS, AAHIVP []  Georgina Pillion, PharmD, BCPS []  , PharmD, BCPS []  [place], PharmD, BCPS []  [address], PharmD  Positive urine culture  []  Patient discharged without antimicrobial prescription and treatment is now indicated [x]  Organism is resistant to prescribed ED discharge antimicrobial []  Patient with positive blood cultures  Changes discussed with ED provider: Soijett Blue PA-C Changed to [facility] by PCP on 01/20/2022  Spoke with patient, date 01/21/2022, time 1215   Lysle Pearl 01/21/2022, 12:16 PM

## 2022-01-21 NOTE — Progress Notes (Signed)
ED Antimicrobial Stewardship Positive Culture Follow Up   Morgan Reese is an 71 y.o. female who presented to [facility] on 01/17/2022 with a chief complaint of  Chief Complaint  Patient presents with   Weakness    Recent Results (from the past 720 hour(s))  Urine Culture     Status: Abnormal   Collection Time: 01/17/22  7:43 PM   Specimen: Urine, Clean Catch  Result Value Ref Range Status   Specimen Description   Final    URINE, CLEAN CATCH Performed at [facility], [address], [place], [place] [address]    Special Requests   Final    NONE Performed at [facility], [address] [place], [place] [address]    Culture >=100,000 COLONIES/mL ENTEROCOCCUS FAECALIS (A)  Final   Report Status 01/20/2022 FINAL  Final   Organism ID, Bacteria ENTEROCOCCUS FAECALIS (A)  Final      Susceptibility   Enterococcus faecalis - MIC*    AMPICILLIN <=2 SENSITIVE Sensitive     NITROFURANTOIN <=16 SENSITIVE Sensitive     VANCOMYCIN 1 SENSITIVE Sensitive     * >=100,000 COLONIES/mL ENTEROCOCCUS FAECALIS  SARS Coronavirus 2 by RT PCR (hospital order, performed in [facility] hospital lab) *cepheid single result test* Anterior Nasal Swab     Status: None   Collection Time: 01/17/22  7:49 PM   Specimen: Anterior Nasal Swab  Result Value Ref Range Status   SARS Coronavirus 2 by RT PCR NEGATIVE NEGATIVE Final    Comment: (NOTE) SARS-CoV-2 target nucleic acids are NOT DETECTED.  The SARS-CoV-2 RNA is generally detectable in upper and lower respiratory specimens during the acute phase of infection. The lowest concentration of SARS-CoV-2 viral copies this assay can detect is 250 copies / mL. A negative result does not preclude SARS-CoV-2 infection and should not be used as the sole basis for treatment or other patient management decisions.  A negative result may occur with improper specimen collection / handling, submission of specimen other than  nasopharyngeal swab, presence of viral mutation(s) within the areas targeted by this assay, and inadequate number of viral copies (<250 copies / mL). A negative result must be combined with clinical observations, patient history, and epidemiological information.  Fact Sheet for Patients:   RoadLapTop.co.za  Fact Sheet for Healthcare Providers: http://kim-miller.com/  This test is not yet approved or  cleared by the [place] FDA and has been authorized for detection and/or diagnosis of SARS-CoV-2 by FDA under an Emergency Use Authorization (EUA).  This EUA will remain in effect (meaning this test can be used) for the duration of the COVID-19 declaration under Section 564(b)(1) of the Act, 21 U.S.C. section 360bbb-3(b)(1), unless the authorization is terminated or revoked sooner.  Performed at [facility], [address], [place], [place] [address]     [x]  Treated with keflex, organism resistant to prescribed antimicrobial []  Patient discharged originally without antimicrobial agent and treatment is now indicated  New antibiotic prescription: Stop keflex. Start macrobid 100mg  by mouth twice daily for 5 days.   ED Provider: , PA-C , PharmD, BCPS Clinical Pharmacist  01/21/2022, 8:12 AM  Monday - Friday phone -  [phone] Saturday - Sunday phone - [phone]

## 2022-01-26 DIAGNOSIS — R531 Weakness: Secondary | ICD-10-CM | POA: Diagnosis not present

## 2022-01-26 DIAGNOSIS — E871 Hypo-osmolality and hyponatremia: Secondary | ICD-10-CM | POA: Diagnosis not present

## 2022-01-26 DIAGNOSIS — N390 Urinary tract infection, site not specified: Secondary | ICD-10-CM | POA: Diagnosis not present

## 2022-02-01 DIAGNOSIS — I952 Hypotension due to drugs: Secondary | ICD-10-CM | POA: Diagnosis not present

## 2022-02-07 ENCOUNTER — Other Ambulatory Visit: Payer: Self-pay

## 2022-02-07 ENCOUNTER — Encounter (HOSPITAL_BASED_OUTPATIENT_CLINIC_OR_DEPARTMENT_OTHER): Payer: Self-pay

## 2022-02-07 ENCOUNTER — Emergency Department (HOSPITAL_BASED_OUTPATIENT_CLINIC_OR_DEPARTMENT_OTHER): Payer: Medicare PPO | Admitting: Radiology

## 2022-02-07 ENCOUNTER — Emergency Department (HOSPITAL_BASED_OUTPATIENT_CLINIC_OR_DEPARTMENT_OTHER)
Admission: EM | Admit: 2022-02-07 | Discharge: 2022-02-07 | Disposition: A | Discharge status: Home / Self Care | Payer: Medicare PPO | Attending: Emergency Medicine | Admitting: Emergency Medicine

## 2022-02-07 DIAGNOSIS — I959 Hypotension, unspecified: Secondary | ICD-10-CM | POA: Insufficient documentation

## 2022-02-07 DIAGNOSIS — R531 Weakness: Secondary | ICD-10-CM | POA: Diagnosis not present

## 2022-02-07 DIAGNOSIS — R0602 Shortness of breath: Secondary | ICD-10-CM | POA: Diagnosis not present

## 2022-02-07 DIAGNOSIS — R42 Dizziness and giddiness: Secondary | ICD-10-CM | POA: Diagnosis not present

## 2022-02-07 DIAGNOSIS — R0789 Other chest pain: Secondary | ICD-10-CM | POA: Diagnosis not present

## 2022-02-07 DIAGNOSIS — R079 Chest pain, unspecified: Secondary | ICD-10-CM | POA: Diagnosis not present

## 2022-02-07 DIAGNOSIS — F419 Anxiety disorder, unspecified: Secondary | ICD-10-CM | POA: Insufficient documentation

## 2022-02-07 LAB — CBC WITH DIFFERENTIAL/PLATELET
Abs Immature Granulocytes: 0.02 10*3/uL (ref 0.00–0.07)
Basophils Absolute: 0 10*3/uL (ref 0.0–0.1)
Basophils Relative: 0 %
Eosinophils Absolute: 0 10*3/uL (ref 0.0–0.5)
Eosinophils Relative: 0 %
HCT: 40 % (ref 36.0–46.0)
Hemoglobin: 13.7 g/dL (ref 12.0–15.0)
Immature Granulocytes: 0 %
Lymphocytes Relative: 12 %
Lymphs Abs: 0.9 10*3/uL (ref 0.7–4.0)
MCH: 31.5 pg (ref 26.0–34.0)
MCHC: 34.3 g/dL (ref 30.0–36.0)
MCV: 92 fL (ref 80.0–100.0)
Monocytes Absolute: 0.6 10*3/uL (ref 0.1–1.0)
Monocytes Relative: 8 %
Neutro Abs: 6 10*3/uL (ref 1.7–7.7)
Neutrophils Relative %: 80 %
Platelets: 210 10*3/uL (ref 150–400)
RBC: 4.35 MIL/uL (ref 3.87–5.11)
RDW: 13.2 % (ref 11.5–15.5)
WBC: 7.5 10*3/uL (ref 4.0–10.5)
nRBC: 0 % (ref 0.0–0.2)

## 2022-02-07 LAB — URINALYSIS, ROUTINE W REFLEX MICROSCOPIC
Bilirubin Urine: NEGATIVE
Glucose, UA: NEGATIVE mg/dL
Hgb urine dipstick: NEGATIVE
Ketones, ur: NEGATIVE mg/dL
Nitrite: NEGATIVE
Protein, ur: NEGATIVE mg/dL
Specific Gravity, Urine: 1.015 (ref 1.005–1.030)
pH: 7 (ref 5.0–8.0)

## 2022-02-07 LAB — COMPREHENSIVE METABOLIC PANEL
ALT: 20 U/L (ref 0–44)
AST: 17 U/L (ref 15–41)
Albumin: 4 g/dL (ref 3.5–5.0)
Alkaline Phosphatase: 59 U/L (ref 38–126)
Anion gap: 7 (ref 5–15)
BUN: 20 mg/dL (ref 8–23)
CO2: 27 mmol/L (ref 22–32)
Calcium: 9.7 mg/dL (ref 8.9–10.3)
Chloride: 100 mmol/L (ref 98–111)
Creatinine, Ser: 0.57 mg/dL (ref 0.44–1.00)
GFR, Estimated: >60 mL/min (ref >60)
Glucose, Bld: 100 mg/dL — ABNORMAL HIGH (ref 70–99)
Potassium: 4.1 mmol/L (ref 3.5–5.1)
Sodium: 134 mmol/L — ABNORMAL LOW (ref 135–145)
Total Bilirubin: 0.5 mg/dL (ref 0.3–1.2)
Total Protein: 6.1 g/dL — ABNORMAL LOW (ref 6.5–8.1)

## 2022-02-07 LAB — TROPONIN I (HIGH SENSITIVITY): Troponin I (High Sensitivity): 3 ng/L (ref <18)

## 2022-02-07 MED ORDER — LACTATED RINGERS IV BOLUS: 1000.0000 mL | Freq: Once | INTRAVENOUS | Status: DC

## 2022-02-07 NOTE — ED Triage Notes (Signed)
Pt presents with c/o low blood pressure after taking meds, new medication still caused decrease so stopped taking meds. Pt reports high blood pressure this morning.   Bp reads 96/67 at triage.

## 2022-02-07 NOTE — ED Provider Notes (Signed)
[facility] EMERGENCY DEPT Provider Note   CSN: [id number] Arrival date & time: 02/07/22  1016     History  Chief Complaint  Patient presents with   Hypotension    Morgan Reese is a 71 y.o. female presenting for evaluation of generalized weakness and abnormal blood pressures.  Patient states for an unknown amount of time, but at least several weeks, she has had issues with weakness.  She states mostly it feels like occasional dizziness and lack of strength.  Occasionally it feels like shortness of breath and chest tightness, although she does also report a history of anxiety.  She states cannot work out as long as she wants to.  She has seen her primary care doctor, was noted to have low blood pressure, which was thought to be the cause.  She was changed 120 mg to 90 mg of diltiazem, however she continues to report intermittent low blood pressure.  She occasionally feels more dizzy when going from sitting to standing.  Her symptoms occur more commonly in the morning after taking her medications. She reports no other recent medication changes.   HPI     Home Medications Prior to Admission medications   Medication Sig Start Date End Date Taking? Authorizing Provider  Ascorbic Acid (VITAMIN C) 1000 MG tablet Take 1,000 mg by mouth daily.    [provider]  carbidopa-levodopa (SINEMET IR) 25-100 MG tablet Take 0.5 tablets by mouth 3 (three) times daily. Patient not taking: Reported on 12/08/2021 10/13/21   Star Age, MD  Cholecalciferol (VITAMIN D3 PO) Take by mouth. Alternates 10,000 units with 5,000 units each day.    [provider]  diazepam (VALIUM) 2 MG tablet Take 1 tablet 30 minutes prior to MRI. May take second dose if needed. 12/18/21   Cameron Sprang, MD  diltiazem [facility]) 120 MG 24 hr capsule Take 120 mg by mouth daily.    [provider]  escitalopram (LEXAPRO) 5 MG tablet Take 1/2 tablet daily. 12/21/21   Mozingo, Berdie Ogren, NP  estradiol (ESTRACE) 0.1 MG/GM vaginal cream Place 1 Applicatorful vaginally at bedtime.    [provider]  estradiol (VIVELLE-DOT) 0.0375 MG/24HR Place 1 patch onto the skin 2 (two) times a week. 04/14/21   [provider]  levothyroxine (SYNTHROID) 50 MCG tablet Take 50 mcg by mouth daily before breakfast.    [provider]  Magnesium Citrate 200 MG TABS See admin instructions.    [provider]  melatonin 3 MG TABS tablet Take 6-9 mg by mouth at bedtime.    [provider]  metoprolol tartrate (LOPRESSOR) 25 MG tablet Take 1 tablet (25 mg total) by mouth daily as needed. Patient taking differently: Take 25 mg by mouth 2 (two) times daily. [place] 01/01/21   Drenda Freeze, MD  Naltrexone HCl, Pain, 1.5 MG CAPS Take by mouth 2 (two) times daily.    [provider]  Omega-3 Fatty Acids (FISH OIL) 1000 MG CAPS Take 1 capsule by mouth at bedtime.    [provider]  progesterone (PROMETRIUM) 200 MG capsule Take 200 mg by mouth daily.    [provider]  pyridOXINE (B-6) 50 MG tablet Take 50 mg by mouth daily.    [provider]  thiamine 100 MG tablet Take 100 mg by mouth daily.    [provider]  Vitamin D, Ergocalciferol, (DRISDOL) 50000 UNITS CAPS capsule Take 50,000 Units by mouth every 7 (seven) days. Patient  not taking: Reported on 12/08/2021    [provider]  VITAMIN K PO Take by mouth daily. MK7    [provider]  zinc gluconate 50 MG tablet Take 50 mg by mouth daily.    [provider]      Allergies    Amoxicillin and Serotonin reuptake inhibitors (ssris)    Review of Systems   Review of Systems  Neurological:  Positive for dizziness (intermittent) and weakness (intermittent).  All other systems reviewed and are negative.   Physical Exam Updated Vital Signs BP (!) 148/77 (BP Location: Left Arm)   Pulse 86   Temp 97.9 F (36.6 C)  (Oral)   Resp 16   Ht 5\' 3"  (1.6 m)   Wt 53.5 kg   SpO2 97%   BMI 20.90 kg/m  Physical Exam Vitals and nursing note reviewed.  Constitutional:      General: She is not in acute distress.    Appearance: Normal appearance.  HENT:     Head: Normocephalic and atraumatic.  Eyes:     Conjunctiva/sclera: Conjunctivae normal.     Pupils: Pupils are equal, round, and reactive to light.  Cardiovascular:     Rate and Rhythm: Normal rate and regular rhythm.     Pulses: Normal pulses.  Pulmonary:     Effort: Pulmonary effort is normal. No respiratory distress.     Breath sounds: Normal breath sounds. No wheezing.     Comments: Speaking in full sentences.  Clear lung sounds in all fields. Abdominal:     General: There is no distension.     Palpations: Abdomen is soft. There is no mass.     Tenderness: There is no abdominal tenderness. There is no guarding or rebound.  Musculoskeletal:        General: Normal range of motion.     Cervical back: Normal range of motion and neck supple.  Skin:    General: Skin is warm and dry.     Capillary Refill: Capillary refill takes less than 2 seconds.  Neurological:     Mental Status: She is alert and oriented to person, place, and time.     GCS: GCS eye subscore is 4. GCS verbal subscore is 5. GCS motor subscore is 6.     Sensory: Sensation is intact.     Motor: Tremor present.     Coordination: Coordination is intact.     Comments: No acute neurologic deficit noted.  CN intact.  Nose to finger intact but with tremor, consistent with patient's history of parkinsonianism.  Strength and sensation intact x4.  Psychiatric:        Mood and Affect: Mood and affect normal.        Speech: Speech normal.        Behavior: Behavior normal.    ED Results / Procedures / Treatments   Labs (all labs ordered are listed, but only abnormal results are displayed) Labs Reviewed  COMPREHENSIVE METABOLIC PANEL - Abnormal; Notable for the following components:       Result Value   Sodium 134 (*)    Glucose, Bld 100 (*)    Total Protein 6.1 (*)    All other components within normal limits  URINALYSIS, ROUTINE W REFLEX MICROSCOPIC - Abnormal; Notable for the following components:   APPearance HAZY (*)    Leukocytes,Ua MODERATE (*)    Bacteria, UA RARE (*)    All other components within normal limits  URINE CULTURE  CBC WITH DIFFERENTIAL/PLATELET  TROPONIN I (HIGH SENSITIVITY)    EKG EKG Interpretation  Date/Time:  Sunday February 07 2022 11:57:29 EDT Ventricular Rate:  91 PR Interval:  90 QRS Duration: 70 QT Interval:  431 QTC Calculation: 531 R Axis:   45 Text Interpretation: Sinus rhythm Short PR interval Nonspecific T abnormalities, lateral leads Prolonged QT interval Confirmed by Regan Lemming ([id number]) on 02/07/2022 12:30:59 PM  Radiology DG Chest 2 View  Result Date: 02/07/2022 CLINICAL DATA:  Shortness of breath chest pain, weakness. EXAM: CHEST - 2 VIEW COMPARISON:  Chest radiograph dated 12/03/2021. FINDINGS: The heart size and mediastinal contours are within normal limits. Vascular calcifications are seen in the aortic arch. Both lungs are clear. The visualized skeletal structures are unremarkable. IMPRESSION: No active cardiopulmonary disease. Aortic Atherosclerosis (ICD10-I70.0). Electronically Signed   By: Zerita Boers M.D.   On: 02/07/2022 13:44    Procedures Procedures    Medications Ordered in ED Medications - No data to display   ED Course/ Medical Decision Making/ A&P                           Medical Decision Making Amount and/or Complexity of Data Reviewed Labs: ordered. Radiology: ordered.    This patient presents to the ED for concern of generalized weakness and abnormal blood pressures. This involves an extensive number of treatment options, and is a complaint that carries with it a high risk of complications and morbidity.  The differential diagnosis includes infection, anemia, ACS, deconditioning, UTI, PNA,  electrolyte abnormality, arrhythmia, anxiety    Co morbidities:  Parkinsonism, HTN, anxiety, hypothyroidism.   Additional history: Additional history obtained from chart review. Reviewed echo results (EF >60%) and recent labs from 12/2021. Reviewed paperwork pt brought with her from PCP appt with low BPs recorded (96 systolic)  Lab Tests:  I ordered, and personally interpreted labs.  The pertinent results include: troponin negative. No leukocytosis to indicate infection. Electrolytes stable. UA without obvious infection, improved from previous. Will send for culture.    Imaging Studies:  I ordered imaging studies including CXR I independently visualized and interpreted imaging which showed no acute findings including infection I agree with the radiologist interpretation   Cardiac Monitoring:  The patient was maintained on a cardiac monitor.  I personally viewed and interpreted the cardiac monitored which showed an underlying rhythm of: NSR. Prolonged qtc.     Critical Interventions:  considered admission due to patients sxs and age, but considering reassuring workup and length of symptoms, do not feel she needs admission at this time. There does not appear to be acute or emergent sxs requiring admission. Discussed with attending, Dr. Armandina Gemma, who evaluated the pt and agrees to plan.    Disposition:  After consideration of the diagnostic results and the patients response to treatment, I feel that the patent would benefit from continued outpatient management with PCP and cardiology. Will have her continue to hold diltiazem in the setting of hypotension, and use 1/2 dose metoprolol as needed. Return precautions given. Pt states she understands and agrees to plan.          Final Clinical Impression(s) / ED Diagnoses Final diagnoses:  General weakness    Rx / DC Orders ED Discharge Orders     None         Franchot Heidelberg, PA-C 02/07/22 1549    Regan Lemming, MD 02/07/22 1714

## 2022-02-07 NOTE — Discharge Instructions (Signed)
Your workup in the ER was reassuring-- there is not acute emergency or abnormal finding that would warrant admission We recommend you continue to hold your diltiazem until you follow up with your primary care doctor If you blood pressure is below 100, I recommend you take a 1/2 tablet of metoprolol.  Return to the ER with any new, worsening, or concerning symptoms

## 2022-02-08 LAB — URINE CULTURE: Culture: <10000 — AB

## 2022-02-09 DIAGNOSIS — R0683 Snoring: Secondary | ICD-10-CM | POA: Diagnosis not present

## 2022-02-16 ENCOUNTER — Telehealth: Payer: Self-pay | Admitting: Neurology

## 2022-02-16 NOTE — Telephone Encounter (Signed)
Noting pt also on diltiazem.  She went to ER for same sx's and was told to f/u with cardiololgy.

## 2022-02-16 NOTE — Telephone Encounter (Signed)
Pt called in and left a message with the access nurse at 12:14 PM. She has been having hypotension. She would like some information on parkinsonism and hypotension. She is having dizzy spells and does not feel right. Sometimes her blood pressure will be below 100, but that was after taking her medication.  Full access nurse report is in Dr. Doristine Devoid box

## 2022-02-17 DIAGNOSIS — I959 Hypotension, unspecified: Secondary | ICD-10-CM | POA: Diagnosis not present

## 2022-02-17 DIAGNOSIS — E039 Hypothyroidism, unspecified: Secondary | ICD-10-CM | POA: Diagnosis not present

## 2022-02-17 DIAGNOSIS — Z6821 Body mass index (BMI) 21.0-21.9, adult: Secondary | ICD-10-CM | POA: Diagnosis not present

## 2022-02-17 DIAGNOSIS — G20 Parkinson's disease: Secondary | ICD-10-CM | POA: Diagnosis not present

## 2022-02-17 NOTE — Telephone Encounter (Signed)
Patient returned call

## 2022-02-17 NOTE — Telephone Encounter (Signed)
Called patient she is at Dr appointment now with [facility]

## 2022-02-18 ENCOUNTER — Other Ambulatory Visit: Payer: Self-pay

## 2022-02-18 NOTE — Telephone Encounter (Signed)
Patient saw PCP yesterday and they took her off of the metoprolol and the diltiazem  patient is seeing cardiology on Monday with [facility] at our office on Tuesday. Gave patient your recommendations an she understands

## 2022-02-19 DIAGNOSIS — R209 Unspecified disturbances of skin sensation: Secondary | ICD-10-CM | POA: Diagnosis not present

## 2022-02-21 NOTE — Progress Notes (Unsigned)
Cardiology Clinic Note   Patient Name: Morgan Reese Date of Encounter: 02/22/2022  Primary Care Provider:  Moshe Cipro, NP Primary Cardiologist:  Thurmon Fair, MD  Patient Profile    Morgan Reese 71 year old female presents to the clinic today for follow-up evaluation of her generalized weakness.  Past Medical History    Past Medical History:  Diagnosis Date   Hypertension    Parkinson's disease    Sinus congestion    Thyroid disease    Past Surgical History:  Procedure Laterality Date   CATARACT EXTRACTION, BILATERAL     NASAL SINUS SURGERY     TUBAL LIGATION      Allergies  Allergies  Allergen Reactions   Amoxicillin Swelling    REACTION: Head felt over-heated Other reaction(s): Other (See Comments) REACTION: Head felt over-heated    Serotonin Reuptake Inhibitors (Ssris) Other (See Comments)    Other reaction(s): hyponatremia SSRI's are contraindicated, causes hyponatremia     History of Present Illness    Morgan Reese has a PMH of hypothyroidism, hyponatremia, generalized weakness and generalized anxiety disorder.  She presented to the emergency department on 02/07/2022 with generalized weakness.  Her EKG showed sinus rhythm with prolonged QT interval.  She reported over the last several weeks she had issues with weakness.  She indicated that she would have occasional episodes of dizziness and lack of strength.  She reported that she was not able to work out as long as she would like to.  She had been seen by her PCP who noted low blood pressures which was felt to be the cause.  Her diltiazem was decreased from 120 mg to 90 mg.  She continued to have intermittent episodes of low blood pressure.  Her symptoms would occur in the morning after taking her medications.  She denied other recent medication changes.  She was instructed to hold her diltiazem in the setting of hypotension and use half dose of metoprolol as needed.  She was  instructed to follow-up with cardiology.  She presents to the clinic today for follow-up evaluation and states she has stopped her blood pressure medication.  She presented to the emergency department after seeing a provider at the walk-in clinic on 02/07/2029.  We reviewed her visit to the emergency department.  Her husband is a retired Teacher, early years/pre.  Her blood pressure today is 94/62.  She continues to have generalized weakness and dizzy episodes.  She is frustrated because she is unable to exercise.  She was previously able to ride her exercise bike several days a week for around 20 minutes without difficulty.  We reviewed her COVID infection, and more recent urinary tract infection.  She also follows with neurology who feel that her Parkinson's diagnosis is not a major factor at this time.  She is noted to have tremor today which she associates with increased stress.  Case was reviewed with Dr. Royann Shivers.  Her EKG today shows sinus tachycardia with short PR interval 104 bpm.  There were no changes from the previous EKG in November.  We agreed to place her on midodrine 2.5 mg twice daily.  I will bring her back in 1 month for follow-up evaluation and have her continue to monitor her blood pressure.  I would also like her to slightly increase her p.o. hydration while being mindful of her sodium level.  They expressed understanding.  She has follow-ups planned with ENT, a nerve conduction study, and an appointment with [facility].  Today  she denies chest pain, shortness of breath, lower extremity edema, fatigue, palpitations, melena, hematuria, hemoptysis, diaphoresis, weakness, presyncope, syncope, orthopnea, and PND.     Home Medications    Prior to Admission medications   Medication Sig Start Date End Date Taking? Authorizing Provider  Ascorbic Acid (VITAMIN C) 1000 MG tablet Take 1,000 mg by mouth daily.    [provider]  Cholecalciferol (VITAMIN D3 PO) Take by mouth. Alternates 10,000  units with 5,000 units each day.    [provider]  diazepam (VALIUM) 2 MG tablet Take 1 tablet 30 minutes prior to MRI. May take second dose if needed. 12/18/21   Cameron Sprang, MD  escitalopram (LEXAPRO) 5 MG tablet Take 1/2 tablet daily. 12/21/21   Mozingo, Berdie Ogren, NP  estradiol (ESTRACE) 0.1 MG/GM vaginal cream Place 1 Applicatorful vaginally at bedtime.    [provider]  estradiol (VIVELLE-DOT) 0.0375 MG/24HR Place 1 patch onto the skin 2 (two) times a week. 04/14/21   [provider]  levothyroxine (SYNTHROID) 50 MCG tablet Take 50 mcg by mouth daily before breakfast.    [provider]  Magnesium Citrate 200 MG TABS See admin instructions.    [provider]  melatonin 3 MG TABS tablet Take 6-9 mg by mouth at bedtime.    [provider]  Naltrexone HCl, Pain, 1.5 MG CAPS Take by mouth 2 (two) times daily.    [provider]  Omega-3 Fatty Acids (FISH OIL) 1000 MG CAPS Take 1 capsule by mouth at bedtime.    [provider]  progesterone (PROMETRIUM) 200 MG capsule Take 200 mg by mouth daily.    [provider]  pyridOXINE (B-6) 50 MG tablet Take 50 mg by mouth daily.    [provider]  thiamine 100 MG tablet Take 100 mg by mouth daily.    [provider]  Vitamin D, Ergocalciferol, (DRISDOL) 50000 UNITS CAPS capsule Take 50,000 Units by mouth every 7 (seven) days. Patient not taking: Reported on 12/08/2021    [provider]  VITAMIN K PO Take by mouth daily. MK7    [provider]  zinc gluconate 50 MG tablet Take 50 mg by mouth daily.    [provider]    Family History    Family History  Problem Relation Age of Onset   Tremor Mother    Depression Mother    Cancer Father        Lung Cancer   Tremor Maternal Grandmother    Diabetes Paternal Grandmother    Stroke Paternal Uncle    Healthy Child    Parkinson's disease Neg Hx    She indicated  that her mother is deceased. She indicated that her father is deceased. She indicated that her sister is alive. She indicated that the status of her maternal grandmother is unknown. She indicated that the status of her paternal grandmother is unknown. She indicated that her child is alive. She indicated that the status of her paternal uncle is unknown. She indicated that the status of her neg hx is unknown.  Social History    Social History   Socioeconomic History   Marital status: Married    Spouse name: Not on file   Number of children: 2   Years of education: Not on file   Highest education level: Not on file  Occupational History   Occupation: retired    Comment: PhD; early childhood education  Tobacco Use   Smoking status: Never  Smokeless tobacco: Never  Vaping Use   Vaping Use: Never used  Substance and Sexual Activity   Alcohol use: Never   Drug use: Never   Sexual activity: Not on file  Other Topics Concern   Not on file  Social History Narrative   Right handed   Caffeine: none    Lives in a two story home with main bedroom on first floor.    Social Determinants of Health   Financial Resource Strain: Not on file  Food Insecurity: Not on file  Transportation Needs: Not on file  Physical Activity: Not on file  Stress: Not on file  Social Connections: Not on file  Intimate Partner Violence: Not on file     Review of Systems    General:  No chills, fever, night sweats or weight changes.  Cardiovascular:  No chest pain, dyspnea on exertion, edema, orthopnea, palpitations, paroxysmal nocturnal dyspnea. Dermatological: No rash, lesions/masses Respiratory: No cough, dyspnea Urologic: No hematuria, dysuria Abdominal:   No nausea, vomiting, diarrhea, bright red blood per rectum, melena, or hematemesis Neurologic:  No visual changes, wkns, changes in mental status. All other systems reviewed and are otherwise negative except as noted above.  Physical Exam    VS:   BP 94/62   Pulse (!) 108   Ht 5' 3.5" (1.613 m)   Wt 123 lb (55.8 kg)   SpO2 95%   BMI 21.45 kg/m  , BMI Body mass index is 21.45 kg/m. GEN: Well nourished, well developed, in no acute distress. HEENT: normal. Neck: Supple, no JVD, carotid bruits, or masses. Cardiac: RRR, no murmurs, rubs, or gallops. No clubbing, cyanosis, edema.  Radials/DP/PT 2+ and equal bilaterally.  Respiratory:  Respirations regular and unlabored, clear to auscultation bilaterally. GI: Soft, nontender, nondistended, BS + x 4. MS: no deformity or atrophy. Skin: warm and dry, no rash. Neuro:  Strength and sensation are intact. Psych: Normal affect.  Accessory Clinical Findings    Recent Labs: 06/13/2021: B Natriuretic Peptide 97.8; Magnesium 2.2; TSH 3.036 02/07/2022: ALT 20; BUN 20; Creatinine, Ser 0.57; Hemoglobin 13.7; Platelets 210; Potassium 4.1; Sodium 134   Recent Lipid Panel No results found for: "CHOL", "TRIG", "HDL", "CHOLHDL", "VLDL", "LDLCALC", "LDLDIRECT"       ECG personally reviewed by me today-sinus tachycardia with short PR interval 104 bpm- No acute changes  Echocardiogram 05/11/2021 IMPRESSIONS     1. Left ventricular ejection fraction, by estimation, is 60 to 65%. Left  ventricular ejection fraction by 3D volume is 61 %. The left ventricle has  normal function. The left ventricle has no regional wall motion  abnormalities. Left ventricular diastolic   parameters are consistent with Grade I diastolic dysfunction (impaired  relaxation).   2. Right ventricular systolic function is normal. The right ventricular  size is normal. There is normal pulmonary artery systolic pressure.   3. Left atrial size was mildly dilated.   4. The mitral valve is normal in structure. Mild mitral valve  regurgitation.   5. Tricuspid valve regurgitation is mild to moderate.   6. The aortic valve is tricuspid. There is mild calcification of the  aortic valve. There is mild thickening of the aortic  valve. Aortic valve  regurgitation is mild.   7. Mild pulmonic stenosis.   8. Aortic dilatation noted. There is mild dilatation of the ascending  aorta, measuring 38 mm.   9. The inferior vena cava is normal in size with greater than 50%  respiratory variability, suggesting right atrial pressure of  3 mmHg.   Comparison(s): No prior Echocardiogram.   Assessment & Plan   1.  Generalized weakness, hypotension-BP today 94/62.  Denies palpitations or episodes of irregular or accelerated heartbeat. Continue blood pressure log Heart healthy low-sodium diet-salty 6 given Increase physical activity as tolerated Increase p.o. hydration  Shortness of breath/exertional dyspnea-no increased DOE today.  Denies recent episodes. Increase physical activity as tolerated  Abnormal EKG-heart rate today 104.  EKG 9/23 showed normal sinus rhythm with prolonged QTc 91 bpm. Continue to monitor  Disposition: Follow-up with Dr. Royann Shivers in 3-4 months   Morgan Reese. Bobbye Petti NP-C     02/22/2022, 12:22 PM [facility] [address] Office [phone] Fax [phone]  Notice: This dictation was prepared with Dragon dictation along with smaller phrase technology. Any transcriptional errors that result from this process are unintentional and may not be corrected upon review.  I spent 14 minutes examining this patient, reviewing medications, and using patient centered shared decision making involving her cardiac care.  Prior to her visit I spent greater than 20 minutes reviewing her past medical history,  medications, and prior cardiac tests.

## 2022-02-22 ENCOUNTER — Ambulatory Visit: Payer: Medicare PPO | Attending: General Practice | Admitting: General Practice

## 2022-02-22 ENCOUNTER — Telehealth: Payer: Self-pay | Admitting: General Practice

## 2022-02-22 ENCOUNTER — Telehealth: Payer: Self-pay | Admitting: Neurology

## 2022-02-22 ENCOUNTER — Encounter: Payer: Self-pay | Admitting: General Practice

## 2022-02-22 ENCOUNTER — Encounter: Payer: Self-pay | Admitting: Cardiovascular Disease

## 2022-02-22 VITALS — BP 94/62 | HR 108 | Ht 63.5 in | Wt 123.0 lb

## 2022-02-22 DIAGNOSIS — R9431 Abnormal electrocardiogram [ECG] [EKG]: Secondary | ICD-10-CM | POA: Diagnosis not present

## 2022-02-22 DIAGNOSIS — R531 Weakness: Secondary | ICD-10-CM

## 2022-02-22 DIAGNOSIS — R0609 Other forms of dyspnea: Secondary | ICD-10-CM | POA: Diagnosis not present

## 2022-02-22 DIAGNOSIS — R0602 Shortness of breath: Secondary | ICD-10-CM | POA: Diagnosis not present

## 2022-02-22 MED ORDER — MIDODRINE HCL 2.5 MG PO TABS: ORAL_TABLET | ORAL | 3 refills | Status: DC

## 2022-02-22 NOTE — Telephone Encounter (Signed)
Patient reported bad shaking that started about 15 minutes ago she reported at her reminder call for her EMG tomorrow.  She said this has not happened before and she'd  like some advice.  Patient aware Vikki Ports will call her later and that Dr. Carles Collet is out of the office.

## 2022-02-22 NOTE — Telephone Encounter (Signed)
Discussed with Coletta Memos, FNP-C, only take midodrine for SBP<100.  Called pt and discussed BP and when to take midodrine. For now take BP BID. Call back with any questions/concerns, verbalized understanding

## 2022-02-22 NOTE — Patient Instructions (Signed)
Medication Instructions:  START MIDODRINE 2.5MG  IN THE AM AND AFTER LUNCH *If you need a refill on your cardiac medications before your next appointment, please call your pharmacy*  Lab Work: NONE  Other Instructions INCREASE HYDRATION  Follow-Up: At [facility], you and your health needs are our priority.  As part of our continuing mission to provide you with exceptional heart care, we have created designated Provider Care Teams.  These Care Teams include your primary Cardiologist (physician) and Advanced Practice Providers (APPs -  Physician Assistants and Nurse Practitioners) who all work together to provide you with the care you need, when you need it.   Your next appointment:   1 month(s)  The format for your next appointment:   In Person  Provider:   Coletta Memos, FNP-C   Important Information About Sugar

## 2022-02-22 NOTE — Telephone Encounter (Signed)
Pt c/o BP issue: STAT if pt c/o blurred vision, one-sided weakness or slurred speech  1. What are your last 5 BP readings? 156/90, 162/83, 153/89, 157/84, prior to medication kicking in 121/88  2. Are you having any other symptoms (ex. Dizziness, headache, blurred vision, passed out)? Had dizziness easier, eyes feel swollen but they are not swollen, very thirsty  3. What is your BP issue? Patient states her BP has been high since taking the new medication.   Pt c/o medication issue:  1. Name of Medication: midodrine (PROAMATINE) 2.5 MG tablet  2. How are you currently taking this medication (dosage and times per day)? Has only taken 1 tablet today about 1:50 pm but is not sure  3. Are you having a reaction (difficulty breathing--STAT)? no  4. What is your medication issue? Patient states since taking the medication her BP has gotten very high.

## 2022-02-22 NOTE — Telephone Encounter (Signed)
Called patient and gave Dr. Tats recommendations  

## 2022-02-22 NOTE — Telephone Encounter (Signed)
Patient stated her blood pressure since her appointment today range from 121/86, P 103 to 156/90. Recommended patient check BP 1-2 hours after medication. She took midodrine 2.5 mg at 1:45. BP now 187/90, P 98. She stated that after taking midodrine she has less tingling in her feet. She wants to know if she should be on midodrine.

## 2022-02-23 ENCOUNTER — Ambulatory Visit: Payer: Medicare PPO | Admitting: Neurology

## 2022-02-23 DIAGNOSIS — M21372 Foot drop, left foot: Secondary | ICD-10-CM | POA: Diagnosis not present

## 2022-02-23 DIAGNOSIS — G5622 Lesion of ulnar nerve, left upper limb: Secondary | ICD-10-CM

## 2022-02-23 DIAGNOSIS — G5732 Lesion of lateral popliteal nerve, left lower limb: Secondary | ICD-10-CM

## 2022-02-23 NOTE — Procedures (Signed)
[facility]  [place], [place]  [place], [place] [address] Tel: [phone] Fax: [phone] Test Date:  02/23/2022  Patient: Morgan Reese DOB: 02-Dec-1950 Physician: Narda Amber, DO  Sex: Female Height: 5\' 3"  Ref Phys: Alonza Bogus, DO  ID#: [id number]   Technician:    History: This is a 71 year old female referred for evaluation of left hand paresthesias and left foot drop.  NCV & EMG Findings: Extensive electrodiagnostic testing of the left upper lower extremity shows:  Left median, mixed palmer, and sural sensory responses are within normal limits.  And peroneal sensory responses are absent. Left median and tibial motor responses are within normal limits. Left ulnar motor response shows reduced amplitude (3.7 mV) and decreased conduction velocity (A Elbow-B Elbow, 45 m/s).  Left peroneal motor response at that extensor digitorum brevis and tibialis anterior shows reduced amplitude (1.2, 0.9 mV) and slowed conduction velocity across fibular head (Poplit-Fib Head, 25-27 m/s). Chronic motor axonal loss changes are seen affecting the ulnar and peroneal innervated muscles without accompanied active denervation.  Impression: Left ulnar neuropathy with slowing across the elbow, with demyelinating and axonal features, severe. Left peroneal neuropathy across the fibular head, with demyelinating and axonal features, severe. There is no evidence of a large fiber sensorimotor neuropathy or cervical/lumbosacral radiculopathy.   ___________________________ Narda Amber, DO    Nerve Conduction Studies   Stim Site NR Peak (ms) Norm Peak (ms) O-P Amp (V) Norm O-P Amp  Left Median Anti Sensory (2nd Digit)  32 C  Wrist    3.2 <3.8 21.7 >10  Left Sup Peroneal Anti Sensory (Ant Lat Mall)  32 C  12 cm *NR  <4.6  >3  Left Sural Anti Sensory (Lat Mall)  32 C  Calf    3.7 <4.6 17.4 >3  Left Ulnar Anti Sensory (5th Digit)  32 C  Wrist *NR  <3.2  >5     Stim Site NR  Onset (ms) Norm Onset (ms) O-P Amp (mV) Norm O-P Amp Site1 Site2 Delta-0 (ms) Dist (cm) Vel (m/s) Norm Vel (m/s)  Left Median Motor (Abd Poll Brev)  32 C  Wrist    2.8 <4.0 7.6 >5 Elbow Wrist 4.2 27.0 64 >50  Elbow    7.0  7.1         Left Peroneal Motor (Ext Dig Brev)  32 C  Ankle    4.9 <6.0 *1.2 >2.5 B Fib Ankle 8.5 34.0 40 >40  B Fib    13.4  0.9  Poplt B Fib 3.2 8.0 *25 >40  Poplt    16.6  1.0         Left Peroneal TA Motor (Tib Ant)  32 C  Fib Head    4.0 <4.5 *0.9 >3 Poplit Fib Head 3.0 8.0 *27 >40  Poplit    *7.0 <5.7 0.6         Left Tibial Motor (Abd Hall Brev)  32 C  Ankle    5.9 <6.0 5.3 >4 Knee Ankle 9.6 38.0 40 >40  Knee    15.5  3.6         Left Ulnar Motor (Abd Dig Minimi)  32 C  Wrist    2.7 <3.1 *3.7 >7 B Elbow Wrist 2.8 19.0 68 >50  B Elbow    5.5  3.8  A Elbow B Elbow 2.2 10.0 *45 >50  A Elbow    7.7  2.9  Stim Site NR Peak (ms) Norm Peak (ms) P-T Amp (V) Site1 Site2 Delta-P (ms) Norm Delta (ms)  Left Median/Ulnar Palm Comparison (Wrist - 8cm)  32 C  Median Palm    1.7 <2.2 116.5 Median Palm Ulnar Palm 0.1   Ulnar Palm    1.6 <2.2 18.1       Electromyography   Side Muscle Nerve Root Ins.Act Fibs Fasc Recrt Amp Dur Poly Activation Comment  Left 1stDorInt Ulnar C8-T1 Nml Nml Nml *2- *1+ *1+ *1+ Nml N/A  Left PronatorTeres Median C6-7 Nml Nml Nml Nml Nml Nml Nml Nml N/A  Left Biceps Musculocut C5-6 Nml Nml Nml Nml Nml Nml Nml Nml N/A  Left Triceps Radial C6-7-8 Nml Nml Nml Nml Nml Nml Nml Nml N/A  Left Deltoid Axillary C5-6 Nml Nml Nml Nml Nml Nml Nml Nml N/A  Left AntTibialis Dp Br Fibular L4-5 Nml Nml Nml *SMU *1+ *1+ *1+ Nml N/A  Left Gastroc Tibial S1-2 Nml Nml Nml Nml Nml Nml Nml Nml N/A  Left Flex Dig Long Tibial L5-S2 Nml Nml Nml Nml Nml Nml Nml Nml N/A  Left RectFemoris Femoral L2-4 Nml Nml Nml Nml Nml Nml Nml Nml N/A  Left Abd Dig Min Ulnar C8-T1 Nml Nml Nml *3- *1+ *1+ *1+ Nml N/A  Left FlexCarpiUln Ulnar C8,T1 Nml Nml Nml *2- *1+ *1+  *1+ Nml N/A  Left ExtHallLong Dp Br Fibular L5, S1 Nml Nml Nml *None *- *- *- Nml N/A  Left Fibularis Long Sup Br Fibular L5-S1 Nml Nml Nml *SMU *1+ *1+ *1+ Nml N/A  Left BicepsFemS Sciatic L5-S1 Nml Nml Nml Nml Nml Nml Nml Nml N/A      Waveforms:

## 2022-02-26 DIAGNOSIS — G252 Other specified forms of tremor: Secondary | ICD-10-CM | POA: Diagnosis not present

## 2022-02-26 DIAGNOSIS — F411 Generalized anxiety disorder: Secondary | ICD-10-CM | POA: Diagnosis not present

## 2022-02-26 DIAGNOSIS — R5383 Other fatigue: Secondary | ICD-10-CM | POA: Diagnosis not present

## 2022-02-26 DIAGNOSIS — R0602 Shortness of breath: Secondary | ICD-10-CM | POA: Diagnosis not present

## 2022-02-26 DIAGNOSIS — E038 Other specified hypothyroidism: Secondary | ICD-10-CM | POA: Diagnosis not present

## 2022-02-26 DIAGNOSIS — R2681 Unsteadiness on feet: Secondary | ICD-10-CM | POA: Diagnosis not present

## 2022-02-26 DIAGNOSIS — N760 Acute vaginitis: Secondary | ICD-10-CM | POA: Diagnosis not present

## 2022-02-26 DIAGNOSIS — Z6821 Body mass index (BMI) 21.0-21.9, adult: Secondary | ICD-10-CM | POA: Diagnosis not present

## 2022-02-26 DIAGNOSIS — Z79899 Other long term (current) drug therapy: Secondary | ICD-10-CM | POA: Diagnosis not present

## 2022-02-26 DIAGNOSIS — G20C Parkinsonism, unspecified: Secondary | ICD-10-CM | POA: Diagnosis not present

## 2022-02-28 ENCOUNTER — Encounter: Payer: Self-pay | Admitting: Neurology

## 2022-02-28 ENCOUNTER — Encounter: Payer: Self-pay | Admitting: Cardiovascular Disease

## 2022-03-01 DIAGNOSIS — R490 Dysphonia: Secondary | ICD-10-CM | POA: Diagnosis not present

## 2022-03-01 DIAGNOSIS — J383 Other diseases of vocal cords: Secondary | ICD-10-CM | POA: Diagnosis not present

## 2022-03-01 NOTE — Addendum Note (Signed)
Addended by: Waylan Rocher on: 03/01/2022 10:39 AM   Modules accepted: Orders

## 2022-03-02 DIAGNOSIS — M79604 Pain in right leg: Secondary | ICD-10-CM | POA: Diagnosis not present

## 2022-03-02 DIAGNOSIS — M79661 Pain in right lower leg: Secondary | ICD-10-CM | POA: Diagnosis not present

## 2022-03-03 ENCOUNTER — Ambulatory Visit: Payer: Medicare PPO | Admitting: Psychiatry

## 2022-03-03 ENCOUNTER — Ambulatory Visit: Payer: Medicare PPO | Admitting: Cardiovascular Disease

## 2022-03-03 DIAGNOSIS — F411 Generalized anxiety disorder: Secondary | ICD-10-CM

## 2022-03-03 NOTE — Progress Notes (Signed)
[facility] Counselor/Therapist Progress Note  Patient ID: Morgan Reese, MRN: [id number],    Date: 03/03/2022  Time Spent: 55 minutes   Treatment Type: Individual Therapy  Reported Symptoms: anxiety, some depression, some effects from her Parkinsonism  Mental Status Exam:  Appearance:   Neat     Behavior:  Appropriate, Sharing, and Motivated  Motor:  Affected by her Parkinsoni sm, uses cane to walk  Speech/Language:   Clear and Coherent  Affect:  Depressed and anxious  Mood:  anxious and some depression  Thought process:  goal directed  Thought content:    WNL  Sensory/Perceptual disturbances:    WNL  Orientation:  oriented to person, place, time/date, situation, day of week, month of year, year, and stated date of March 03, 2022  Attention:  Good  Concentration:  Good and Fair  Memory:  Did not indicate concerns and did not demonstrate any in session today  Fund of knowledge:   Good  Insight:    Good  Judgment:   Good  Impulse Control:  Good   Risk Assessment: Danger to Self:  No Self-injurious Behavior: No Danger to Others: No Duty to Warn:no Physical Aggression / Violence:No  Access to Firearms a concern: No  Gang Involvement:No   Subjective:  Patient in today reporting anxiety as her main symptom, along with some depression. "Worries" about her health issues. Concerns about her health and "hard to be patient". Frustrated and needing to be heard, which she vented freely in session and was able to receive support and feel heard about her journey with parkinsonism.  States that she was to get off the Xanax in order to get on an antidepressant but was not able to take an antidepressant due to potential kidney issues.  She plans to talk with her doctor further about this.  Looked at ways she can work more with her anxiety and depression especially in terms of getting out of the house more as she is physically able, having more direct contact with her friends in  person as much as possible, getting out a little bit in the neighborhood where the homes are close and she would have more chances of interaction with people, and being in more frequent phone contact with friends.  Encouraged her to continue going to the gym which helps her physically and emotionally, and again provides more contact with people.  Does get frustrated at times when she cannot "do things like I used to be able to do things because of my physical problems."  Encouraged her and not being so self-critical and instead think of what kind of thoughts and self-talk she can use in better understanding herself and acknowledging how she is trying in the midst of challenging circumstances.  Worked some today with patient on her excessive need to control and is really bothered by things out of her control and we will follow up on this more next session.  Interventions: Cognitive Behavioral Therapy and Ego-Supportive  Treatment goals: Treatment goals remain on treatment plan as patient works with strategies to achieve her goals. Progress is assessed each session and noted in "subject" and /or "plan" sections of treatment note. Long-term goal: Reduce overall level, frequncy, and intensity of the anxiety so that daily functioning is not impaired.  Short-term goal: Increase understanding of beliefs and messages that produces the worry and anxiety.  Describe current and past experiences with specific fears, prominent worries, and anxiety symptoms including their impact on functioning and  attempts to resolve it.  Strategies: Explore cognitive messages that mediate anxiety response and retrain in adaptive cognitions.   Diagnosis:   ICD-10-CM   1. Generalized anxiety disorder  F41.1      Plan: Patient today showing good participation and motivation in session focusing more on her anxiety, depression, and frustrations in dealing with healthcare issues.  She shows some good strength even in discussing the  difficult and more sensitive concerns that she has about her health and how it is impacted her life so much and contributes to her fears about the future.  Worked on some of her control issues and helped her process why this is so difficult for her in ways that she might be able to approach things out of her control that might not be as distressing for her, including working on acceptance which patient seem to relate to and admitted that is difficult.  Did encourage her to continue in her reaching out to people who are supportive, trying not to assume worst case scenarios, staying more in the present versus the past and not too far into the future,.  Also encouraged to use deep breathing exercises as discussed in session.  She states that she plans to talk more with family about her needing more support at times and to be more direct in talking about this with them as it is important to her. Encouraged patient to be practicing more positive behaviors as noted in sessions including: Staying in the present and focusing on what she can change, getting outside some daily and walking or going to the gym as she is able, being more careful as she walks to prevent falling, staying in touch with people who are supportive, trying not to look ahead and imagine worst case scenarios, healthy nutrition and exercise, encouraging self talk, reduce overthinking and over analyzing, allow her faith to be an emotional support, and realize the strength she shows in working with goal directed behaviors to move in a direction that supports her improved emotional health.  Goal review and progress/challenges noted with patient.  Next appointment within 3 weeks.  This record has been created using [employer].  Chart creation errors have been sought, but may not always have been located and corrected.  Such creation errors do not reflect on the standard of medical care provided.   Shanon Ace,  LCSW

## 2022-03-08 ENCOUNTER — Telehealth: Payer: Self-pay | Admitting: Adult Health

## 2022-03-08 NOTE — Telephone Encounter (Signed)
Are you able to speak to her?

## 2022-03-08 NOTE — Telephone Encounter (Signed)
Rtc to patient, she discussed various things about her medical condition. She reports trembling at times, which sounds more like an anxiety symptom than parkinsonian symptoms. She does drink Camomile Tea and it eases it up temporarily. I discussed some mild options for anxiety that maybe she could think about such as hydroxyzine, which she already had at 25 mg. Recommend she take that at bedtime with her Melatonin for sleep. She does have a home sleep study tonight, she reports snoring and not getting into a deep sleep. Also discussed Buspar. Gave her information on that with side effects, explained the process of Buspar as opposed to a benzodiazapine. She was very appreciative of call and will call back with an update or any further questions or concerns.

## 2022-03-08 NOTE — Telephone Encounter (Signed)
Pt called requesting to speak with RM. Last apt with Rm was 8/23 due to stopping meds. Sodium level was out of whack. Tonight she has sleep study. She needs advise from RM. She started shacking in the morning when she gets up. Before shacking at random times and could redirect and would stop. She also stated stopped BP meds. BP was up and down. Contact pt @ [phone]

## 2022-03-09 DIAGNOSIS — G4719 Other hypersomnia: Secondary | ICD-10-CM | POA: Diagnosis not present

## 2022-03-09 NOTE — Telephone Encounter (Signed)
Noted. Ty!

## 2022-03-10 ENCOUNTER — Telehealth: Payer: Self-pay | Admitting: Neurology

## 2022-03-10 NOTE — Telephone Encounter (Signed)
Patient called and said she like some medication for nerve pain. Both of her legs are bothering her and it gets worse when she stands on her feet.   She's been going to the gym and doing recommended exercises she said but her tremors are getting worse as well.  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

## 2022-03-11 ENCOUNTER — Telehealth: Payer: Self-pay | Admitting: Adult Health

## 2022-03-11 ENCOUNTER — Ambulatory Visit: Payer: Medicare PPO | Admitting: Adult Health

## 2022-03-11 NOTE — Telephone Encounter (Signed)
LVM to RC 

## 2022-03-11 NOTE — Telephone Encounter (Signed)
Pt called reporting she had talked to Traci earlier this week and suggestion was Buspar for anxiety. Pt has apt 10/20 video. Only 20 min apt. Wanted to get advise from RM on Buspar or other options before apt., due to limited time for apt. No SSRI due to sodium issues. Please notified Pt about this today with decision to give heads up before apt 10/20. Contact # [phone]

## 2022-03-11 NOTE — Telephone Encounter (Signed)
Is she wanting to try the Buspar?

## 2022-03-12 ENCOUNTER — Encounter: Payer: Self-pay | Admitting: Adult Health

## 2022-03-12 ENCOUNTER — Telehealth (INDEPENDENT_AMBULATORY_CARE_PROVIDER_SITE_OTHER): Payer: Medicare PPO | Admitting: Adult Health

## 2022-03-12 DIAGNOSIS — F411 Generalized anxiety disorder: Secondary | ICD-10-CM

## 2022-03-12 MED ORDER — BUSPIRONE HCL 5 MG PO TABS: ORAL_TABLET | ORAL | 2 refills | Status: DC

## 2022-03-12 NOTE — Progress Notes (Signed)
Morgan Reese [id number] 16-Jun-1950 71 y.o.  Virtual Visit via Video Note  I connected with pt @ on 03/12/22 at  3:20 PM EDT by a video enabled telemedicine application and verified that I am speaking with the correct person using two identifiers.   I discussed the limitations of evaluation and management by telemedicine and the availability of in person appointments. The patient expressed understanding and agreed to proceed.  I discussed the assessment and treatment plan with the patient. The patient was provided an opportunity to ask questions and all were answered. The patient agreed with the plan and demonstrated an understanding of the instructions.   The patient was advised to call back or seek an in-person evaluation if the symptoms worsen or if the condition fails to improve as anticipated.  I provided 20 minutes of non-face-to-face time during this encounter.  The patient was located at home.  The provider was located at [place].   Morgan Gell, NP   Subjective:   Patient ID:  Morgan Reese is a 71 y.o. (DOB 1950-12-28) female.  Chief Complaint: No chief complaint on file.   HPI Morgan Reese presents for follow-up of anxiety disorder.    Describes mood today as "not the best". Pleasant. Denies tearfulness. Mood symptoms - reports depression because she is "anxiety ridden". Feels anxious - "usually earlier in the day". Denies irritability. Reports some worry and rumination - "related to health problems". Reports 3 UTI's in the past 4 months. Stating "I'm not doing ok". Willing to consider other options. Follows up with Dr Carles Collet in November. Varying interest and motivation. Taking medications as prescribed.  Energy levels "better in the mornings". Active, has a regular exercise routine currently - going to the gym. Enjoys some usual interests and activities. Married - lives with husband. Has 2 adult children. Spending time with family.  Appetite  adequate. Weight fluctuates - 118 to 122 pounds. Sleeps well most nights. Averages 6 to 8 hours of broken sleep.  Focus and concentration stable. Completing tasks. Managing aspects of household. Retired. Denies SI or HI.  Denies AH or VH. Denies paranoia.   Review of Systems:  Review of Systems  Musculoskeletal:  Negative for gait problem.  Neurological:  Negative for tremors.  Psychiatric/Behavioral:         Please refer to HPI    Medications: I have reviewed the patient's current medications.  Current Outpatient Medications  Medication Sig Dispense Refill   busPIRone (BUSPAR) 5 MG tablet Take 1/2 tablet with dinner for 7 nights, then take 1/2 tablet daily with breakfast and supper. 30 tablet 2   Ascorbic Acid (VITAMIN C) 1000 MG tablet Take 1,000 mg by mouth daily.     Cholecalciferol (VITAMIN D3 PO) Take by mouth. Alternates 10,000 units with 5,000 units each day.     COPPER PO Take 30 mg by mouth 2 (two) times a week.     estradiol (ESTRACE) 0.1 MG/GM vaginal cream Place 1 Applicatorful vaginally at bedtime.     estradiol (VIVELLE-DOT) 0.0375 MG/24HR Place 1 patch onto the skin 2 (two) times a week.     levothyroxine (SYNTHROID) 50 MCG tablet Take 50 mcg by mouth daily before breakfast.     Magnesium Citrate 200 MG TABS Take 600 mg by mouth daily.     melatonin 3 MG TABS tablet Take 6-9 mg by mouth at bedtime.     midodrine (PROAMATINE) 2.5 MG tablet Take 1 tablet (2.5 mg total) by mouth in  the morning AND 1 tablet (2.5 mg total) daily after lunch. 60 tablet 3   Naltrexone HCl, Pain, 1.5 MG CAPS Take by mouth 2 (two) times daily.     NON FORMULARY Apply 2 % topically daily.     Omega-3 Fatty Acids (FISH OIL) 1000 MG CAPS Take 1 capsule by mouth at bedtime.     progesterone (PROMETRIUM) 200 MG capsule Take 200 mg by mouth daily.     pyridOXINE (B-6) 50 MG tablet Take 50 mg by mouth daily.     thiamine 100 MG tablet Take 100 mg by mouth daily.     VITAMIN K PO Take by mouth  daily. MK7     zinc gluconate 50 MG tablet Take 50 mg by mouth daily.     No current facility-administered medications for this visit.    Medication Side Effects: None  Allergies:  Allergies  Allergen Reactions   Amoxicillin Swelling    REACTION: Head felt over-heated Other reaction(s): Other (See Comments) REACTION: Head felt over-heated    Serotonin Reuptake Inhibitors (Ssris) Other (See Comments)    Other reaction(s): hyponatremia SSRI's are contraindicated, causes hyponatremia     Past Medical History:  Diagnosis Date   Hypertension    Parkinson's disease    Sinus congestion    Thyroid disease     Family History  Problem Relation Age of Onset   Tremor Mother    Depression Mother    Cancer Father        Lung Cancer   Tremor Maternal Grandmother    Diabetes Paternal Grandmother    Stroke Paternal Uncle    Healthy Child    Parkinson's disease Neg Hx     Social History   Socioeconomic History   Marital status: Married    Spouse name: Not on file   Number of children: 2   Years of education: Not on file   Highest education level: Not on file  Occupational History   Occupation: retired    Comment: PhD; early childhood education  Tobacco Use   Smoking status: Never   Smokeless tobacco: Never  Vaping Use   Vaping Use: Never used  Substance and Sexual Activity   Alcohol use: Never   Drug use: Never   Sexual activity: Not on file  Other Topics Concern   Not on file  Social History Narrative   Right handed   Caffeine: none    Lives in a two story home with main bedroom on first floor.    Social Determinants of Health   Financial Resource Strain: Not on file  Food Insecurity: Not on file  Transportation Needs: Not on file  Physical Activity: Not on file  Stress: Not on file  Social Connections: Not on file  Intimate Partner Violence: Not on file    Past Medical History, Surgical history, Social history, and Family history were reviewed and  updated as appropriate.   Please see review of systems for further details on the patient's review from today.   Objective:   Physical Exam:  There were no vitals taken for this visit.  Physical Exam Constitutional:      General: She is not in acute distress. Musculoskeletal:        General: No deformity.  Neurological:     Mental Status: She is alert and oriented to person, place, and time.     Coordination: Coordination normal.  Psychiatric:        Attention and Perception: Attention and perception normal.  She does not perceive auditory or visual hallucinations.        Mood and Affect: Mood normal. Mood is not anxious or depressed. Affect is not labile, blunt, angry or inappropriate.        Speech: Speech normal.        Behavior: Behavior normal.        Thought Content: Thought content normal. Thought content is not paranoid or delusional. Thought content does not include homicidal or suicidal ideation. Thought content does not include homicidal or suicidal plan.        Cognition and Memory: Cognition and memory normal.        Judgment: Judgment normal.     Comments: Insight intact     Lab Review:     Component Value Date/Time   NA 134 (L) 02/07/2022 1230   K 4.1 02/07/2022 1230   CL 100 02/07/2022 1230   CO2 27 02/07/2022 1230   GLUCOSE 100 (H) 02/07/2022 1230   BUN 20 02/07/2022 1230   CREATININE 0.57 02/07/2022 1230   CALCIUM 9.7 02/07/2022 1230   PROT 6.1 (L) 02/07/2022 1230   ALBUMIN 4.0 02/07/2022 1230   AST 17 02/07/2022 1230   ALT 20 02/07/2022 1230   ALKPHOS 59 02/07/2022 1230   BILITOT 0.5 02/07/2022 1230   GFRNONAA >60 02/07/2022 1230       Component Value Date/Time   WBC 7.5 02/07/2022 1230   RBC 4.35 02/07/2022 1230   HGB 13.7 02/07/2022 1230   HCT 40.0 02/07/2022 1230   PLT 210 02/07/2022 1230   MCV 92.0 02/07/2022 1230   MCH 31.5 02/07/2022 1230   MCHC 34.3 02/07/2022 1230   RDW 13.2 02/07/2022 1230   LYMPHSABS 0.9 02/07/2022 1230    MONOABS 0.6 02/07/2022 1230   EOSABS 0.0 02/07/2022 1230   BASOSABS 0.0 02/07/2022 1230    No results found for: "POCLITH", "LITHIUM"   No results found for: "PHENYTOIN", "PHENOBARB", "VALPROATE", "CBMZ"   .res Assessment: Plan:    Plan:  Buspar 5mg  - take 1/2 tablet with dinner for 7 days, then increase to 1/2 tablet with breakfast and 1/2 tablet with dinner.  PDMP reviewed  Discussed potential benefits, risk, and side effects of benzodiazepines to include potential risk of tolerance and dependence, as well as possible drowsiness. Advised patient not to drive if experiencing drowsiness and to take lowest possible effective dose to minimize risk of dependence and tolerance.   Time spent with patient was 20 minutes. Greater than 50% of face to face time with patient was spent on counseling and coordination of care.    RTC 4 weeks  Diagnoses and all orders for this visit:  Generalized anxiety disorder -     busPIRone (BUSPAR) 5 MG tablet; Take 1/2 tablet with dinner for 7 nights, then take 1/2 tablet daily with breakfast and supper.     Please see After Visit Summary for patient specific instructions.  Future Appointments  Date Time Provider [place]  03/26/2022 11:20 AM Deberah Pelton, NP [facility] None  03/31/2022 12:00 PM Shanon Ace, LCSW [facility] None  04/12/2022  1:00 PM Tat, Eustace Quail, DO [facility] None    No orders of the defined types were placed in this encounter.     -------------------------------

## 2022-03-12 NOTE — Telephone Encounter (Signed)
Discussed Buspar with patient. She has an appt with Barnett Applebaum this afternoon.

## 2022-03-17 DIAGNOSIS — G4710 Hypersomnia, unspecified: Secondary | ICD-10-CM | POA: Diagnosis not present

## 2022-03-18 ENCOUNTER — Telehealth: Payer: Self-pay | Admitting: Adult Health

## 2022-03-18 DIAGNOSIS — R0981 Nasal congestion: Secondary | ICD-10-CM | POA: Diagnosis not present

## 2022-03-18 DIAGNOSIS — R350 Frequency of micturition: Secondary | ICD-10-CM | POA: Diagnosis not present

## 2022-03-18 DIAGNOSIS — E871 Hypo-osmolality and hyponatremia: Secondary | ICD-10-CM | POA: Diagnosis not present

## 2022-03-18 DIAGNOSIS — Z87440 Personal history of urinary (tract) infections: Secondary | ICD-10-CM | POA: Diagnosis not present

## 2022-03-18 NOTE — Telephone Encounter (Signed)
Pt called reporting she needs to  increase the dose for Buspar. Advise needed. Contact Pt at [phone]

## 2022-03-18 NOTE — Telephone Encounter (Signed)
Can you call and follow up?

## 2022-03-18 NOTE — Telephone Encounter (Signed)
Patient said she is very anxious today and has read that the Buspar can cause anxiety. She wants to know if she can increase the dose. She hasn't been on the medication for a week yet. She is also worried about an interaction with Sinemet, which she hasn't been put on yet, but it is anticipated she will at some point due to parkinsonism.

## 2022-03-19 NOTE — Telephone Encounter (Signed)
Is she still taking the 2.5mg ?

## 2022-03-19 NOTE — Telephone Encounter (Signed)
She is still taking the 1/2 tab once a day, tomorrow increases to 1/2 tab BID. She said anxiety is better today than it was earlier this week, but she is still anxious. Told her that it can take a couple of weeks before she starts feeling effects.

## 2022-03-19 NOTE — Telephone Encounter (Signed)
See message, not sure I routed to you.

## 2022-03-19 NOTE — Telephone Encounter (Signed)
Noted. Sounds like it may be helpful.

## 2022-03-22 ENCOUNTER — Other Ambulatory Visit: Payer: Self-pay | Admitting: *Deleted

## 2022-03-22 DIAGNOSIS — M79604 Pain in right leg: Secondary | ICD-10-CM

## 2022-03-23 ENCOUNTER — Encounter: Payer: Self-pay | Admitting: Neurology

## 2022-03-23 ENCOUNTER — Encounter (HOSPITAL_COMMUNITY): Payer: Medicare PPO

## 2022-03-23 DIAGNOSIS — G20A1 Parkinson's disease without dyskinesia, without mention of fluctuations: Secondary | ICD-10-CM | POA: Diagnosis not present

## 2022-03-23 DIAGNOSIS — R2681 Unsteadiness on feet: Secondary | ICD-10-CM | POA: Diagnosis not present

## 2022-03-23 DIAGNOSIS — F5101 Primary insomnia: Secondary | ICD-10-CM | POA: Diagnosis not present

## 2022-03-23 DIAGNOSIS — Z6821 Body mass index (BMI) 21.0-21.9, adult: Secondary | ICD-10-CM | POA: Diagnosis not present

## 2022-03-23 DIAGNOSIS — F419 Anxiety disorder, unspecified: Secondary | ICD-10-CM | POA: Diagnosis not present

## 2022-03-23 DIAGNOSIS — E039 Hypothyroidism, unspecified: Secondary | ICD-10-CM | POA: Diagnosis not present

## 2022-03-23 NOTE — Telephone Encounter (Signed)
Mitzi Hansen a Librarian, academic with [employer] called on 03/22/22 @ 4:25pm regarding patient. He had her in the office and she had complaints of pain. Mitzi Hansen noted that he is aware that patient has an appointment with Dr. Carles Collet in approx. 1 month and wanted to know if she needed to be seen sooner due to her complaints of leg pain and drop foot. I informed Mitzi Hansen that Dr. Carles Collet has already reviewed her referral however I will forward a message and let Dr. Carles Collet know he called and expressed concerns that treating her was outside his comfort zone.

## 2022-03-24 ENCOUNTER — Encounter: Payer: Medicare PPO | Admitting: Vascular Surgery

## 2022-03-26 ENCOUNTER — Telehealth: Payer: Self-pay | Admitting: Neurology

## 2022-03-26 ENCOUNTER — Ambulatory Visit: Payer: Medicare PPO | Admitting: General Practice

## 2022-03-26 NOTE — Telephone Encounter (Signed)
Called patients home and mobile number and left a voicemail to call me back

## 2022-03-26 NOTE — Telephone Encounter (Signed)
Patient returned call to [place]. 

## 2022-03-26 NOTE — Telephone Encounter (Signed)
I called [facility] as I noted in their note that they saw the patient and told her to follow back up with me, but did not examine her and never looked at the EMG in epic and did not even charge her for the visit.  I tried to contact the physician, but they gave me the PA who saw the patient.  I told the PA that she had ulnar neuropathy that was severe and compression at the fibular head, which were generally considered orthopedic issues.  She had no evidence of peripheral neuropathy and her MRI of the lumbar spine generally looked okay, and asked why they did not evaluate her further.  He stated that it was because she came in acutely and had no records.  I questioned whether or not they had access to epic, and he said that they do but they did not look for her records and did not look at the EMG.  I explained that we generally do not take care of severe ulnar neuropathy at the elbow or severe peroneal neuropathy at the fibular head, although we are certainly able to diagnose that.  He agreed and stated that they would need to get her back into the office.  Chelsea, please make pt aware that [facility] should be calling her.

## 2022-03-29 NOTE — Telephone Encounter (Signed)
Spoke to patient and she is going to see them next Friday and patient is going Ent

## 2022-03-30 ENCOUNTER — Ambulatory Visit (HOSPITAL_COMMUNITY)
Admission: RE | Admit: 2022-03-30 | Discharge: 2022-03-30 | Disposition: A | Discharge status: Home / Self Care | Payer: Medicare PPO | Source: Ambulatory Visit | Attending: Vascular Surgery | Admitting: Vascular Surgery

## 2022-03-30 ENCOUNTER — Ambulatory Visit: Payer: Medicare PPO | Admitting: Vascular Surgery

## 2022-03-30 ENCOUNTER — Encounter: Payer: Self-pay | Admitting: Vascular Surgery

## 2022-03-30 VITALS — BP 140/88 | HR 100 | Temp 98.7°F | Resp 20 | Ht 63.5 in | Wt 126.0 lb

## 2022-03-30 DIAGNOSIS — M79604 Pain in right leg: Secondary | ICD-10-CM

## 2022-03-30 DIAGNOSIS — M79605 Pain in left leg: Secondary | ICD-10-CM | POA: Diagnosis not present

## 2022-03-30 DIAGNOSIS — M79606 Pain in leg, unspecified: Secondary | ICD-10-CM | POA: Insufficient documentation

## 2022-03-30 NOTE — Progress Notes (Signed)
Patient name: Morgan Reese MRN: [id number] DOB: 30-Dec-1950 Sex: female  REASON FOR CONSULT: Right leg pain  HPI: Morgan Reese is a 71 y.o. female, with history of Parkinson's that presents for evaluation of right lower extremity pain per the referral.  In discussing with the patient, she actually has pins-and-needles sensation in both feet particularly worse in the morning when she is getting out of bed.  This has been ongoing for a long time.  She does feel like this improves throughout the day.  It is on the bottom of both feet.  She has also noted some swelling in her right leg.  She does have a neurologist that follows her for Parkinson's.  She carries no diagnosis of neuropathy.  Past Medical History:  Diagnosis Date   Hypertension    Parkinson's disease    Sinus congestion    Thyroid disease     Past Surgical History:  Procedure Laterality Date   CATARACT EXTRACTION, BILATERAL     NASAL SINUS SURGERY     TUBAL LIGATION      Family History  Problem Relation Age of Onset   Tremor Mother    Depression Mother    Cancer Father        Lung Cancer   Tremor Maternal Grandmother    Diabetes Paternal Grandmother    Stroke Paternal Uncle    Healthy Child    Parkinson's disease Neg Hx     SOCIAL HISTORY: Social History   Socioeconomic History   Marital status: Married    Spouse name: Not on file   Number of children: 2   Years of education: Not on file   Highest education level: Not on file  Occupational History   Occupation: retired    Comment: PhD; early childhood education  Tobacco Use   Smoking status: Never   Smokeless tobacco: Never  Vaping Use   Vaping Use: Never used  Substance and Sexual Activity   Alcohol use: Never   Drug use: Never   Sexual activity: Not on file  Other Topics Concern   Not on file  Social History Narrative   Right handed   Caffeine: none    Lives in a two story home with main bedroom on first floor.    Social  Determinants of Health   Financial Resource Strain: Not on file  Food Insecurity: Not on file  Transportation Needs: Not on file  Physical Activity: Not on file  Stress: Not on file  Social Connections: Not on file  Intimate Partner Violence: Not on file    Allergies  Allergen Reactions   Amoxicillin Swelling    REACTION: Head felt over-heated Other reaction(s): Other (See Comments) REACTION: Head felt over-heated    Serotonin Reuptake Inhibitors (Ssris) Other (See Comments)    Other reaction(s): hyponatremia SSRI's are contraindicated, causes hyponatremia     Current Outpatient Medications  Medication Sig Dispense Refill   Ascorbic Acid (VITAMIN C) 1000 MG tablet Take 1,000 mg by mouth daily.     busPIRone (BUSPAR) 5 MG tablet Take 1/2 tablet with dinner for 7 nights, then take 1/2 tablet daily with breakfast and supper. 30 tablet 2   Cholecalciferol (VITAMIN D3 PO) Take by mouth. Alternates 10,000 units with 5,000 units each day.     COPPER PO Take 30 mg by mouth 2 (two) times a week.     estradiol (ESTRACE) 0.1 MG/GM vaginal cream Place 1 Applicatorful vaginally at bedtime.     estradiol (  VIVELLE-DOT) 0.0375 MG/24HR Place 1 patch onto the skin 2 (two) times a week.     levothyroxine (SYNTHROID) 50 MCG tablet Take 50 mcg by mouth daily before breakfast.     Magnesium Citrate 200 MG TABS Take 600 mg by mouth daily.     melatonin 3 MG TABS tablet Take 6-9 mg by mouth at bedtime.     NON FORMULARY Apply 2 % topically daily.     Omega-3 Fatty Acids (FISH OIL) 1000 MG CAPS Take 1 capsule by mouth at bedtime.     progesterone (PROMETRIUM) 200 MG capsule Take 200 mg by mouth daily.     pyridOXINE (B-6) 50 MG tablet Take 50 mg by mouth daily.     thiamine 100 MG tablet Take 100 mg by mouth daily.     VITAMIN K PO Take by mouth daily. MK7     zinc gluconate 50 MG tablet Take 50 mg by mouth daily.     No current facility-administered medications for this visit.    REVIEW OF  SYSTEMS:  [X]  denotes positive finding, [ ]  denotes negative finding Cardiac  Comments:  Chest pain or chest pressure:    Shortness of breath upon exertion:    Short of breath when lying flat:    Irregular heart rhythm:        Vascular    Pain in calf, thigh, or hip brought on by ambulation:    Pain in feet at night that wakes you up from your sleep:     Blood clot in your veins:    Leg swelling:         Pulmonary    Oxygen at home:    Productive cough:     Wheezing:         Neurologic    Sudden weakness in arms or legs:     Sudden numbness in arms or legs:     Sudden onset of difficulty speaking or slurred speech:    Temporary loss of vision in one eye:     Problems with dizziness:         Gastrointestinal    Blood in stool:     Vomited blood:         Genitourinary    Burning when urinating:     Blood in urine:        Psychiatric    Major depression:         Hematologic    Bleeding problems:    Problems with blood clotting too easily:        Skin    Rashes or ulcers:        Constitutional    Fever or chills:      PHYSICAL EXAM: Vitals:   03/30/22 1502  BP: (!) 140/88  Pulse: 100  Resp: 20  Temp: 98.7 F (37.1 C)  SpO2: 98%  Weight: 126 lb (57.2 kg)  Height: 5' 3.5" (1.613 m)    GENERAL: The patient is a well-nourished female, in no acute distress. The vital signs are documented above. CARDIAC: There is a regular rate and rhythm.  VASCULAR:  Bilateral femoral pulses palpable Bilateral popliteal pulses palpable Bilateral DP pulses palpable PULMONARY: No respiratory distress. ABDOMEN: Soft and non-tender. MUSCULOSKELETAL: There are no major deformities or cyanosis. NEUROLOGIC: No focal weakness or paresthesias are detected. SKIN: There are no ulcers or rashes noted. PSYCHIATRIC: The patient has a normal affect.  DATA:   ABI's today are 1.10 on the right triphasic and  1.06 on the left triphasic - no evidence of significant arterial  disease  Assessment/Plan:  71 year old female presents for evaluation of pain in in her bilateral lower extremities worse in the morning that she describes as a pins and needle sensation.  I discussed I do not think she has any evidence of significant arterial insufficiency to explain her symptoms.  She has normal ABIs greater than 1 with triphasic waveforms and no evidence of significant arterial disease.  She also has a normal exam with palpable pedal pulses.  She can follow-up with [place] as needed.   Cephus Shelling, MD [facility] Office: [phone]

## 2022-03-31 ENCOUNTER — Ambulatory Visit: Payer: Medicare PPO | Admitting: Psychiatry

## 2022-03-31 NOTE — Progress Notes (Unsigned)
[facility] Counselor/Therapist Progress Note  Patient ID: Morgan Reese, MRN: [id number],    Date: 03/31/2022  Time Spent: ***   Treatment Type: {CHL AMB THERAPY [phone]}  Reported Symptoms: ***  Mental Status Exam:  Appearance:   {PSY:22683}     Behavior:  {PSY:21022743}  Motor:  {PSY:22302}  Speech/Language:   {PSY:22685}  Affect:  {PSY:22687}  Mood:  {PSY:31886}  Thought process:  {PSY:31888}  Thought content:    {PSY:[phone]}  Sensory/Perceptual disturbances:    {[phone]}  Orientation:  {PSY:30297}  Attention:  {PSY:22877}  Concentration:  {PSY:613-632-3417}  Memory:  {PSY:431-393-1969}  Fund of knowledge:   {PSY:613-632-3417}  Insight:    {PSY:613-632-3417}  Judgment:   {PSY:613-632-3417}  Impulse Control:  {PSY:[id number]}   Risk Assessment: Danger to Self:  {PSY:22692} Self-injurious Behavior: {PSY:22692} Danger to Others: {PSY:22692} Duty to Warn:{PSY:311194} Physical Aggression / Violence:{PSY:21197} Access to Firearms a concern: {PSY:21197} Gang Involvement:{PSY:21197}  Subjective: ***   Patient in today for session and reports dieting    Patient in today reporting anxiety as her main symptom, along with some depression. "Worries" about her health issues. Concerns about her health and "hard to be patient". Frustrated and needing to be heard, which she vented freely in session and was able to receive support and feel heard about her journey with parkinsonism.  States that she was to get off the Xanax in order to get on an antidepressant but was not able to take an antidepressant due to potential kidney issues.  She plans to talk with her doctor further about this.  Looked at ways she can work more with her anxiety and depression especially in terms of getting out of the house more as she is physically able, having more direct contact with her friends in person as much as possible, getting out a little bit in the neighborhood where the homes are  close and she would have more chances of interaction with people, and being in more frequent phone contact with friends.  Encouraged her to continue going to the gym which helps her physically and emotionally, and again provides more contact with people.  Does get frustrated at times when she cannot "do things like I used to be able to do things because of my physical problems."  Encouraged her and not being so self-critical and instead think of what kind of thoughts and self-talk she can use in better understanding herself and acknowledging how she is trying in the midst of challenging circumstances.  Worked some today with patient on her excessive need to control and is really bothered by things out of her control and we will follow up on this more next session.     Interventions: {PSY:[phone]}  Long-term goal: Reduce overall level, frequncy, and intensity of the anxiety so that daily functioning is not impaired.  Short-term goal: Increase understanding of beliefs and messages that produces the worry and anxiety.  Describe current and past experiences with specific fears, prominent worries, and anxiety symptoms including their impact on functioning and attempts to resolve it.  Strategies: Explore cognitive messages that mediate anxiety response and retrain in adaptive cognitions.     Diagnosis:No diagnosis found.  Plan: ***   Patient today actively participating in session   Patient today showing good participation and motivation in session focusing more on her anxiety, depression, and frustrations in dealing with healthcare issues.  She shows some good strength even in discussing the difficult and more sensitive concerns that she has about her health  and how it is impacted her life so much and contributes to her fears about the future.  Worked on some of her control issues and helped her process why this is so difficult for her in ways that she might be able to approach things out of her  control that might not be as distressing for her, including working on acceptance which patient seem to relate to and admitted that is difficult.  Did encourage her to continue in her reaching out to people who are supportive, trying not to assume worst case scenarios, staying more in the present versus the past and not too far into the future,.  Also encouraged to use deep breathing exercises as discussed in session.  She states that she plans to talk more with family about her needing more support at times and to be more direct in talking about this with them as it is important to her.     /////////////////////////////////////////////////////////////////////////////////////////   Encourage patient in her practice of more positive behaviors as noted in session including: Getting out some daily and walking or going to the gym as she is able, stay in the present focusing what she can change versus cannot, being more careful as she walks to prevent falling, staying in touch with people who are supportive, trying not to look ahead and imagining worst-case scenarios, healthy nutrition and exercise, deep breathing exercises that can help with the anxiety, encouraging self talk, reduce overthinking and overanalyzing, allow her faith to be an emotional support as well as spiritual, and recognize the strengths she shows and working with goal-directed behaviors to move in a direction that supports her improved emotional health and overall wellbeing.  Goal review and progress/challenges noted with patient.  Next appointment within 3 to 4 weeks.  This record has been created using [employer].  Chart creation errors have been sought, but may not always have been located and corrected.  Such creation errors do not reflect on the standard of medical care provided.   Mathis Fare, LCSW

## 2022-04-02 DIAGNOSIS — M722 Plantar fascial fibromatosis: Secondary | ICD-10-CM | POA: Diagnosis not present

## 2022-04-05 ENCOUNTER — Ambulatory Visit (INDEPENDENT_AMBULATORY_CARE_PROVIDER_SITE_OTHER): Payer: Medicare PPO | Admitting: Psychiatry

## 2022-04-05 ENCOUNTER — Telehealth: Payer: Self-pay | Admitting: Adult Health

## 2022-04-05 ENCOUNTER — Encounter: Payer: Self-pay | Admitting: Psychiatry

## 2022-04-05 DIAGNOSIS — F411 Generalized anxiety disorder: Secondary | ICD-10-CM | POA: Diagnosis not present

## 2022-04-05 NOTE — Telephone Encounter (Signed)
Please advise 

## 2022-04-05 NOTE — Telephone Encounter (Signed)
Pt called and said last week the orthopedic doctor put her on Gabapentin (she thinks for neuropathy).  She wants to know if it will interfere with Buspar.  Next appt 11/17

## 2022-04-05 NOTE — Progress Notes (Signed)
[facility] Counselor/Therapist Progress Note  Patient ID: NOVIA KANIA, MRN: [id number],    Date: 04/05/2022  Time Spent: 55 minutes   Virtual Visit via Telehealth Note: MyChart Video session Connected with patient by a telemedicine/telehealth application, with their informed consent, and verified patient privacy and that I am speaking with the correct person using two identifiers. I discussed the limitations, risks, security and privacy concerns of performing psychotherapy and the availability of in person appointments. I also discussed with the patient that there may be a patient responsible charge related to this service. The patient expressed understanding and agreed to proceed. I discussed the treatment planning with the patient. The patient was provided an opportunity to ask questions and all were answered. The patient agreed with the plan and demonstrated an understanding of the instructions. The patient was advised to call  our office if  symptoms worsen or feel they are in a crisis state and need immediate contact.   Therapist Location: office Patient Location: home   Treatment Type: Individual Therapy  Reported Symptoms:  anxiety, depression, frustration, anger, concerned re: recent foot neuropathy, "when I feel pain it makes my anxiety level go up."  Mental Status Exam:  Appearance:   Casual     Behavior:  Appropriate, Sharing, and Motivated  Motor:  Affected by her Parkinson's  Speech/Language:   Clear and Coherent  Affect:  Depressed and anxious  Mood:  anxious and depressed  Thought process:  goal directed  Thought content:    WNL  Sensory/Perceptual disturbances:    WNL  Orientation:  oriented to person, place, time/date, situation, day of week, month of year, year, and stated date of Nov. 13, 2023  Attention:  Fair  Concentration:  Fair and "I'm not as fast in my thinking"  Memory:  WNL Not really having memory issues right now.  Fund of knowledge:    Good and Fair  Insight:    Good and Fair  Judgment:   Good  Impulse Control:  Good   Risk Assessment: Danger to Self:  No Self-injurious Behavior: No Danger to Others: No Duty to Warn:no Physical Aggression / Violence:No  Access to Firearms a concern: No  Gang Involvement:No   Subjective:  Patient today reporting more depression due to recent foot issues and "it depresses me and my husband." I get anxious about being anxious and am anxious about my voice getting weaker and more hoarse and am to start voice therapy in January at [facility] like "I go to doctors all the time and do worry about my health issues." Needing more support/listening/suggested strategies/coping skills and that is what we focused on today regarding her difficulty with anxiety, anger, frustration,depression, and worrying. Discussed some strategies with patient as well as helping her voice what feel helpful versus not helpful. Sleeping good at night. Not going to gym right now due to neuropathy in feet, although tried going to gym earlier today. States what has helped her most today is just being able to just talk freely about her concerns and uncertainties. Encouraged patient to be willing to reach out to some of her friends and other family to let them know when a visit might be helpful for her and she agreed. Encouraged her to see her strength in the midst of these challenging times.  Interventions: Cognitive Behavioral Therapy and Ego-Supportive  Long-term goal: Reduce overall level, frequncy, and intensity of the anxiety so that daily functioning is not impaired.  Short-term goal:  Increase understanding of beliefs and messages that produces the worry and anxiety.  Describe current and past experiences with specific fears, prominent worries, and anxiety symptoms including their impact on functioning and attempts to resolve it.  Strategies: Explore cognitive messages that mediate anxiety response and retrain in  adaptive cognitions.   Diagnosis:   ICD-10-CM   1. Generalized anxiety disorder  F41.1      Plan: Patient today participated well even though her energy "comes and goes". Does feel her acupuncture is helping her pain some. Worked well today in confronting her anxiety, depression and frustrations of her current health challenges. Showing some good strength. Concerns about how her health issues and "if they will continue to worsen, and talks more freely today about those concerns. Acknowledges that it does help to have support and talk things through and encouraged her to reach out to the people she knows cares about her, especially when she feels it would help to have people visit. Encouraged patient to be practicing more positive behaviors as noted in session including: Remaining in the present and focusing on what she can change, getting outside some each day and walking or going to the gym as she is able, being more careful as she walks to prevent falling, stay in touch with people who are supportive, trying not to look ahead and imagine worst-case scenarios, healthy nutrition and exercise, encouraging self talk, reduce overthinking and over analyzing, allow her faith to be an emotional support, and recognize the strength she shows working with goal-directed behaviors to move in a direction that supports her improved emotional health and overall wellbeing.  Goal review and progress/challenges noted with patient.  Next appointment within 3 to 4 weeks.  This record has been created using [employer].  Chart creation errors have been sought, but may not always have been located and corrected.  Such creation errors do not reflect on the standard of medical care provided.   Mathis Fare, LCSW

## 2022-04-05 NOTE — Telephone Encounter (Signed)
Pt informed

## 2022-04-06 ENCOUNTER — Other Ambulatory Visit: Payer: Self-pay

## 2022-04-06 DIAGNOSIS — R202 Paresthesia of skin: Secondary | ICD-10-CM

## 2022-04-07 NOTE — Progress Notes (Signed)
Assessment/Plan:   1.  Parkinsonism, likely early Parkinson's disease             -DaTscan completed on Oct 06, 2021 was abnormal, with decreased radiotracer in right putamen.             -Discussed skin biopsies for alpha-synuclein.  She declines             -Patient felt that she could not tolerate levodopa, but she has only taken half a tablet of levodopa on 2 separate occasions.  I think that this would be very unusual to not tolerate this type of dose, especially since this likely did not even get into the CNS at this dose.  I think that the bigger issue is likely anxiety, which she is already being treated for at [facility].  In fact, she thought that the first half tablet of levodopa caused high blood pressure, and levodopa is only known to cause lower blood pressure.  Reviewed this with her today and last visit, and also reviewed that at some point in time, all patients will need levodopa.  We both decided to hold off on that today.  -We will start rasagiline, 1 mg daily.  R/b/se discussed.    2.  GAD             -This remains the biggest issue that she has and a significant barrier to wellness  -I am really happy to see that she has discontinued the benzodiazepine.  Congratulated her on that today.  -She is on very low-dose BuSpar, and may need additional medication.  -Discussed with her the appropriateness of how often she calls and emails the office and to make sure that she is emailing the proper office with questions so that she gets them appropriately addressed.     3.  L foot drop             -Due to severe left peroneal neuropathy across the fibular head.  Patient now following with orthopedics.  I told her she would need to address questions about orthopedic stuff (gabapentin) to orthopedics.  -reiterated to patient today that her last EMG showed no evidence of PN.  She did r state that orthopedics is having an EMG done on the right side of the body and that will be done here  soon.  She will need to f/u with ortho for results of that.     4.  L ulnar neuropathy at the elbow             -Patient now following with orthopedics, although she states that she was unaware of the ulnar neuropathy.  I did give her the results of that today, but it appears much lefts symptomatic than the foot drop.  She is going to follow back up with orthopedics soon.    Subjective:   Morgan Reese was seen today in follow up for Parkinsons disease.  My previous records were reviewed prior to todays visit as well as outside records available to me. Husband supplements hx.  Pt denies falls.  She has intermittent internal tremor and she has increased anxiety when she feels that.  She has had numerous calls to my office, many of them unrelated to Parkinson's disease itself.  Some of those were related to her thyroid medication and some related to her antidepressants.  Some of the phone calls were related to her blood pressure medications (diltiazem).  With each of these phone calls, she was  told to follow-up with the prescribing providers or the providers that were taking care of those issues.  She states that her diltiazem was d/c.  She did have an EMG done since our last visit, demonstrating left peroneal neuropathy and left ulnar neuropathy at the elbow.  She went to orthopedics and initially, she was told to come back here for those issues.  I ended up calling orthopedics myself, as the issues found on EMG were generally issues that orthopedics takes care of.  The PA that saw her admitted that they never looked at the EMG and told me that they would see her back for the peroneal neuropathy and left ulnar neuropathy.  I did not get notes after that, but they did order a subsequent EMG of the right lower extremity.  She reports that they started her on gabapentin and asks about that.  She states that she just had an MRI leg but doesn't have results yet.    ALLERGIES:   Allergies  Allergen  Reactions   Amoxicillin Swelling    REACTION: Head felt over-heated Other reaction(s): Other (See Comments) REACTION: Head felt over-heated    Serotonin Reuptake Inhibitors (Ssris) Other (See Comments)    Other reaction(s): hyponatremia SSRI's are contraindicated, causes hyponatremia     CURRENT MEDICATIONS:  Current Meds  Medication Sig   Ascorbic Acid (VITAMIN C) 1000 MG tablet Take 1,000 mg by mouth daily.   busPIRone (BUSPAR) 5 MG tablet Take 1/2 tablet three times daily for anxiety.   Cholecalciferol (VITAMIN D3 PO) Take by mouth. Alternates 10,000 units with 5,000 units each day.   COPPER PO Take 30 mg by mouth 2 (two) times a week.   estradiol (ESTRACE) 0.1 MG/GM vaginal cream Place 1 Applicatorful vaginally at bedtime.   estradiol (VIVELLE-DOT) 0.0375 MG/24HR Place 1 patch onto the skin 2 (two) times a week.   gabapentin (NEURONTIN) 300 MG capsule Take by mouth.   levothyroxine (SYNTHROID) 50 MCG tablet Take 50 mcg by mouth daily before breakfast.   Magnesium Citrate 200 MG TABS Take 600 mg by mouth daily.   melatonin 3 MG TABS tablet Take 6-9 mg by mouth at bedtime.   NON FORMULARY Apply 2 % topically daily.   Omega-3 Fatty Acids (FISH OIL) 1000 MG CAPS Take 1 capsule by mouth at bedtime.   progesterone (PROMETRIUM) 200 MG capsule Take 200 mg by mouth daily.   pyridOXINE (B-6) 50 MG tablet Take 50 mg by mouth daily.   thiamine 100 MG tablet Take 100 mg by mouth daily.   VITAMIN K PO Take by mouth daily. MK7   zinc gluconate 50 MG tablet Take 50 mg by mouth daily.     Objective:   PHYSICAL EXAMINATION:    VITALS:   Vitals:   04/12/22 1248  BP: 118/66  Pulse: 91  SpO2: 98%  Weight: 126 lb 6.4 oz (57.3 kg)  Height: 5\' 3"  (1.6 m)    GEN:  The patient appears stated age and is in NAD. HEENT:  Normocephalic, atraumatic.  The mucous membranes are moist. The superficial temporal arteries are without ropiness or tenderness. CV:  RRR Lungs:  CTAB Neck/HEME:  There  are no carotid bruits bilaterally.  Neurological examination:  Orientation: The patient is alert and oriented x3.  Cranial nerves: There is good facial symmetry.  Mild facial hypomimia.  Extraocular muscles are intact. The visual fields are full to confrontational testing. The speech is fluent and clear. Soft palate rises symmetrically and there is  no tongue deviation. Hearing is intact to conversational tone. Sensation: Sensation is intact to light touch x 4 Motor: Strength is at least antigravity x 4    Movement examination: Tone: There is nl tone in the bilateral upper extremities.  The tone in the lower extremities is nl.  Abnormal movements: min R hand tremor Coordination:  There is mild decremation with RAM's, with hand opening and closing and finger taps, R>L.  All other rams are good except toe taps on the L, limited by minor foot drop on the L Gait and Station: The patient has no difficulty arising out of a deep-seated chair without the use of the hands. The patient's stride length is good, but ambulation is limited by chronic left foot drop.  She does not shuffle.  She does wear an AFO on the L and ambulates with a cane  I have reviewed and interpreted the following labs independently    Chemistry      Component Value Date/Time   NA 134 (L) 02/07/2022 1230   K 4.1 02/07/2022 1230   CL 100 02/07/2022 1230   CO2 27 02/07/2022 1230   BUN 20 02/07/2022 1230   CREATININE 0.57 02/07/2022 1230      Component Value Date/Time   CALCIUM 9.7 02/07/2022 1230   ALKPHOS 59 02/07/2022 1230   AST 17 02/07/2022 1230   ALT 20 02/07/2022 1230   BILITOT 0.5 02/07/2022 1230       Lab Results  Component Value Date   WBC 7.5 02/07/2022   HGB 13.7 02/07/2022   HCT 40.0 02/07/2022   MCV 92.0 02/07/2022   PLT 210 02/07/2022    Lab Results  Component Value Date   TSH 3.036 06/13/2021     Total time spent on today's visit was 46 minutes, including both face-to-face time and  nonface-to-face time.  Time included that spent on review of records (prior notes available to me/labs/imaging if pertinent), discussing treatment and goals, answering patient's questions and coordinating care.  Cc:  Moshe Cipro, NP

## 2022-04-08 DIAGNOSIS — M25562 Pain in left knee: Secondary | ICD-10-CM | POA: Diagnosis not present

## 2022-04-09 ENCOUNTER — Telehealth (INDEPENDENT_AMBULATORY_CARE_PROVIDER_SITE_OTHER): Payer: Medicare PPO | Admitting: Adult Health

## 2022-04-09 ENCOUNTER — Encounter: Payer: Self-pay | Admitting: Adult Health

## 2022-04-09 DIAGNOSIS — F411 Generalized anxiety disorder: Secondary | ICD-10-CM

## 2022-04-09 MED ORDER — BUSPIRONE HCL 5 MG PO TABS: ORAL_TABLET | ORAL | 5 refills | Status: DC

## 2022-04-09 NOTE — Progress Notes (Signed)
Morgan Reese [id number] 09-Oct-1950 71 y.o.  Virtual Visit via Video Note  I connected with pt @ on 04/09/22 at  2:00 PM EST by a video enabled telemedicine application and verified that I am speaking with the correct person using two identifiers.   I discussed the limitations of evaluation and management by telemedicine and the availability of in person appointments. The patient expressed understanding and agreed to proceed.  I discussed the assessment and treatment plan with the patient. The patient was provided an opportunity to ask questions and all were answered. The patient agreed with the plan and demonstrated an understanding of the instructions.   The patient was advised to call back or seek an in-person evaluation if the symptoms worsen or if the condition fails to improve as anticipated.  I provided 15 minutes of non-face-to-face time during this encounter.  The patient was located at home.  The provider was located at [facility].   Dorothyann Gibbs, NP   Subjective:   Patient ID:  Morgan Reese is a 71 y.o. (DOB [birth year]) female.  Chief Complaint: No chief complaint on file.   HPI RHYLIN VENTERS presents for follow-up of anxiety disorder.    Describes mood today as "better". Pleasant. Reports one tearful episode. Mood symptoms - reports depression - "at times". Reports decreased anxiety - more so about the future - "trying to stay in the moment". Decreased irritability. Reports some worry and rumination - "more about health problems". Stating "I'm doing better". Feels like the addition of Buspar has been helpful - willing to increase Buspar 2.5mg  BID to TID. Improved interest and motivation. Taking medications as prescribed. Energy levels improved. Active, has a regular exercise routine - going to the gym Enjoys some usual interests and activities. Married - lives with husband. Has 2 adult children. Spending time with family.  Appetite adequate.  Weight gain - 124 pounds. Sleeps well most nights. Averages 7 to 8 hours. Denies daytime napping. Focus and concentration "better". Completing tasks. Managing aspects of household. Retired. Denies SI or HI.  Denies AH or VH. Denies paranoia.  Review of Systems:  Review of Systems  Musculoskeletal:  Negative for gait problem.  Neurological:  Negative for tremors.  Psychiatric/Behavioral:         Please refer to HPI    Medications: I have reviewed the patient's current medications.  Current Outpatient Medications  Medication Sig Dispense Refill   Ascorbic Acid (VITAMIN C) 1000 MG tablet Take 1,000 mg by mouth daily.     busPIRone (BUSPAR) 5 MG tablet Take 1/2 tablet with dinner for 7 nights, then take 1/2 tablet daily with breakfast and supper. 30 tablet 2   Cholecalciferol (VITAMIN D3 PO) Take by mouth. Alternates 10,000 units with 5,000 units each day.     COPPER PO Take 30 mg by mouth 2 (two) times a week.     estradiol (ESTRACE) 0.1 MG/GM vaginal cream Place 1 Applicatorful vaginally at bedtime.     estradiol (VIVELLE-DOT) 0.0375 MG/24HR Place 1 patch onto the skin 2 (two) times a week.     levothyroxine (SYNTHROID) 50 MCG tablet Take 50 mcg by mouth daily before breakfast.     Magnesium Citrate 200 MG TABS Take 600 mg by mouth daily.     melatonin 3 MG TABS tablet Take 6-9 mg by mouth at bedtime.     NON FORMULARY Apply 2 % topically daily.     Omega-3 Fatty Acids (FISH OIL) 1000 MG CAPS Take  1 capsule by mouth at bedtime.     progesterone (PROMETRIUM) 200 MG capsule Take 200 mg by mouth daily.     pyridOXINE (B-6) 50 MG tablet Take 50 mg by mouth daily.     thiamine 100 MG tablet Take 100 mg by mouth daily.     VITAMIN K PO Take by mouth daily. MK7     zinc gluconate 50 MG tablet Take 50 mg by mouth daily.     No current facility-administered medications for this visit.    Medication Side Effects: None  Allergies:  Allergies  Allergen Reactions   Amoxicillin Swelling     REACTION: Head felt over-heated Other reaction(s): Other (See Comments) REACTION: Head felt over-heated    Serotonin Reuptake Inhibitors (Ssris) Other (See Comments)    Other reaction(s): hyponatremia SSRI's are contraindicated, causes hyponatremia     Past Medical History:  Diagnosis Date   Hypertension    Parkinson's disease    Sinus congestion    Thyroid disease     Family History  Problem Relation Age of Onset   Tremor Mother    Depression Mother    Cancer Father        Lung Cancer   Tremor Maternal Grandmother    Diabetes Paternal Grandmother    Stroke Paternal Uncle    Healthy Child    Parkinson's disease Neg Hx     Social History   Socioeconomic History   Marital status: Married    Spouse name: Not on file   Number of children: 2   Years of education: Not on file   Highest education level: Not on file  Occupational History   Occupation: retired    Comment: PhD; early childhood education  Tobacco Use   Smoking status: Never   Smokeless tobacco: Never  Vaping Use   Vaping Use: Never used  Substance and Sexual Activity   Alcohol use: Never   Drug use: Never   Sexual activity: Not on file  Other Topics Concern   Not on file  Social History Narrative   Right handed   Caffeine: none    Lives in a two story home with main bedroom on first floor.    Social Determinants of Health   Financial Resource Strain: Not on file  Food Insecurity: Not on file  Transportation Needs: Not on file  Physical Activity: Not on file  Stress: Not on file  Social Connections: Not on file  Intimate Partner Violence: Not on file    Past Medical History, Surgical history, Social history, and Family history were reviewed and updated as appropriate.   Please see review of systems for further details on the patient's review from today.   Objective:   Physical Exam:  There were no vitals taken for this visit.  Physical Exam Constitutional:      General: She is  not in acute distress. Musculoskeletal:        General: No deformity.  Neurological:     Mental Status: She is alert and oriented to person, place, and time.     Coordination: Coordination normal.  Psychiatric:        Attention and Perception: Attention and perception normal. She does not perceive auditory or visual hallucinations.        Mood and Affect: Mood normal. Mood is not anxious or depressed. Affect is not labile, blunt, angry or inappropriate.        Speech: Speech normal.        Behavior: Behavior  normal.        Thought Content: Thought content normal. Thought content is not paranoid or delusional. Thought content does not include homicidal or suicidal ideation. Thought content does not include homicidal or suicidal plan.        Cognition and Memory: Cognition and memory normal.        Judgment: Judgment normal.     Comments: Insight intact     Lab Review:     Component Value Date/Time   NA 134 (L) 02/07/2022 1230   K 4.1 02/07/2022 1230   CL 100 02/07/2022 1230   CO2 27 02/07/2022 1230   GLUCOSE 100 (H) 02/07/2022 1230   BUN 20 02/07/2022 1230   CREATININE 0.57 02/07/2022 1230   CALCIUM 9.7 02/07/2022 1230   PROT 6.1 (L) 02/07/2022 1230   ALBUMIN 4.0 02/07/2022 1230   AST 17 02/07/2022 1230   ALT 20 02/07/2022 1230   ALKPHOS 59 02/07/2022 1230   BILITOT 0.5 02/07/2022 1230   GFRNONAA >60 02/07/2022 1230       Component Value Date/Time   WBC 7.5 02/07/2022 1230   RBC 4.35 02/07/2022 1230   HGB 13.7 02/07/2022 1230   HCT 40.0 02/07/2022 1230   PLT 210 02/07/2022 1230   MCV 92.0 02/07/2022 1230   MCH 31.5 02/07/2022 1230   MCHC 34.3 02/07/2022 1230   RDW 13.2 02/07/2022 1230   LYMPHSABS 0.9 02/07/2022 1230   MONOABS 0.6 02/07/2022 1230   EOSABS 0.0 02/07/2022 1230   BASOSABS 0.0 02/07/2022 1230    No results found for: "POCLITH", "LITHIUM"   No results found for: "PHENYTOIN", "PHENOBARB", "VALPROATE", "CBMZ"   .res Assessment: Plan:     Plan:  Buspar 5mg  - take 1/2 tablet three times daily for anxiety  Started on Gabapentin 100mg  - now at 200mg  - plans to continue to 300mg  daily   PDMP reviewed  Discussed potential benefits, risk, and side effects of benzodiazepines to include potential risk of tolerance and dependence, as well as possible drowsiness. Advised patient not to drive if experiencing drowsiness and to take lowest possible effective dose to minimize risk of dependence and tolerance.   Time spent with patient was 20 minutes. Greater than 50% of face to face time with patient was spent on counseling and coordination of care.    RTC 4 weeks   There are no diagnoses linked to this encounter.   Please see After Visit Summary for patient specific instructions.  Future Appointments  Date Time Provider Department Center  04/12/2022  1:00 PM Tat, , DO [facility] None  05/04/2022  3:00 PM K, DO [facility] None    No orders of the defined types were placed in this encounter.     -------------------------------

## 2022-04-12 ENCOUNTER — Telehealth: Payer: Self-pay | Admitting: Adult Health

## 2022-04-12 ENCOUNTER — Encounter: Payer: Self-pay | Admitting: Neurology

## 2022-04-12 ENCOUNTER — Ambulatory Visit: Payer: Medicare PPO | Admitting: Neurology

## 2022-04-12 VITALS — BP 118/66 | HR 91 | Ht 63.0 in | Wt 126.4 lb

## 2022-04-12 DIAGNOSIS — F411 Generalized anxiety disorder: Secondary | ICD-10-CM | POA: Diagnosis not present

## 2022-04-12 DIAGNOSIS — G20A1 Parkinson's disease without dyskinesia, without mention of fluctuations: Secondary | ICD-10-CM

## 2022-04-12 DIAGNOSIS — G20C Parkinsonism, unspecified: Secondary | ICD-10-CM | POA: Insufficient documentation

## 2022-04-12 DIAGNOSIS — G5730 Lesion of lateral popliteal nerve, unspecified lower limb: Secondary | ICD-10-CM | POA: Diagnosis not present

## 2022-04-12 MED ORDER — RASAGILINE MESYLATE 1 MG PO TABS: 1.0000 mg | ORAL_TABLET | Freq: Every day | ORAL | 1 refills | Status: DC

## 2022-04-12 NOTE — Telephone Encounter (Signed)
Please review

## 2022-04-12 NOTE — Patient Instructions (Signed)
start rasagaline 1 mg daily.    Local and Online Resources for Power over Parkinson's Group  November 2023    LOCAL [place] PARKINSON'S GROUPS   Power over Parkinson's Group:    Power Over Parkinson's Patient Education Group will be Wednesday, November 8th-*Hybrid meting*- in person at [facility] location and via [facility], 2:00-3:00 pm.   Starting in November, Power over [employer] and Care Partner Groups will meet together, with plans for separate break out session for caregivers (*this will be evolving over the next few months) Upcoming Power over Parkinson's Meetings/Care Partner Support:  2nd Wednesdays of the month at 2 pm:   November 8th, December 13th  [place] at [email or web address] if interested in participating in this group    [place] and Fall Prevention Workshop.  Thursday, November 9th 1-2pm, [employer], [employer].  Register with Vonna Kotyk at [email or web address] or [phone] New PWR! Moves Dyne[employer] Instructor-Led Classes offering at [employer]!  TUESDAYS and Wednesdays 1-2 pm.   Contact Vonna Kotyk at  [email or web address]  or [phone] (Tuesday classes are modified for chair and standing only) Dance for Parkinson 's classes will be on Tuesdays 9:30am-10:30am starting October 3-December 12 with a break the week of November 21st. Located in the [employer] , in the first floor of the [employer] ([place] To register:  [email or web address] or [phone]  Drumming for Parkinson's will be held on 2nd and 4th Mondays at 11:00 am.   Located at the [place] ([place]  [place] at [email or web address] or [phone]  Through support from the [place] for Parkinson's classes are free for both patients and caregivers.     [employer] Parkinson's Tai Chi Class, Mondays at 11 am.  Call [phone] for details Parkinson's Holiday Party.  Wednesday, December 6th, 4:00-5:00 pm.  [facility] and Fitness.  RSVP to Garnetta Buddy at [phone] or [email or web address]   [place]:  [email or web address]  PD Health at Home continues:  Mindfulness Mondays, Wellness Wednesdays, Fitness Fridays   Upcoming Education:   Why Should you Participate in Parkinson's Research?  Wednesday, Nov. 29th,  1-2 pm  Expert Briefing:    Hallucinations and Delusions in Parkinson's.  Wednesday, Nov. 8th, 1-2 pm  Register for expert briefings (webinars) at [email or web address]  Please check out their website to sign up for emails and see their full online offerings      [place]:  [email or web address]   Third Thursday Webinars:  On the third Thursday of every month at 12 p.m. ET, join our free live webinars to learn about various aspects of living with Parkinson's disease and our work to speed medical breakthroughs.  Upcoming Webinar:  A Year Like No Other in Parkinson's Research:  2023 in Review.  Thursday, November 16th 12 noon. Check out additional information on their website to see their full online offerings    [facility]:  [email or web address]  Upcoming Webinar:   Stay tuned  Webinar Series:  Living with Parkinson's Meetup.   Third Thursdays each month, 3 pm  Care Partner Monthly Meetup.  With Robin Searing Phinney.  First Tuesday of each month, 2 pm  Check out additional information to Live Well Today on their website    Parkinson and Movement Disorders (PMD) Alliance:  www.[email or web address]  [employer]:  Online Education Events  Sign up  for emails, which are sent weekly to give you updates on programming and online offerings    Parkinson's Association of the [place]:   www.[email or web address]  Information on online support groups, education events, and online exercises including Yoga, Parkinson's exercises and more-LOTS of information on links to PD resources and online events  Virtual Support Group through Parkinson's Association of the [place]; next one is scheduled for Wednesday, November 1st  at 2 pm.  (These are typically scheduled for the 1st Wednesday of the month at 2 pm).  Visit website for details.   MOVEMENT AND EXERCISE OPPORTUNITIES  PWR! Moves Classes at [place].  Wednesdays 10 and 11 am.   Contact Amy Marriott, PT [email or web address] if interested.  NEW PWR! Moves Class offerings at [employer].  *TUESDAYS* and Wednesdays 1-2 pm.  Contact Vonna Kotyk at  [email or web address]    Parkinson's Wellness Recovery (PWR! Moves)  www.[email or web address]  Info on the PWR! Virtual Experience:  You will have access to our expertise?through self-assessment, guided plans that start with the PD-specific fundamentals, educational content, tips, Q&A with an expert, and a growing Art therapist of PD-specific pre-recorded and live exercise classes of varying types and intensity - both physical and cognitive! If that is not enough, we offer 1:1 wellness consultations (in-person or virtual) to personalize your PWR! Research scientist (medical).   [place] Fridays:   As part of the PD Health @ Home program, this free video series focuses each week on one aspect of fitness designed to support people living with Parkinson's.? These weekly videos highlight the [place] fitness guidelines for people with Parkinson's disease.  [email or web address]   Dance for PD website is offering free, live-stream classes throughout the week, as well as links to [employer] of classes:  [email or web address]  Virtual dance and Pilates for Parkinson's classes: Click on the  Community Tab> Parkinson's Movement Initiative Tab.  To register for classes and for more information, visit [email or web address] and click the "community" tab.   [employer] Parkinson's Cycling Classes   [employer]:  Thursdays @ Noon-Live classes at [employer] ([employer] at [email or web address]?or [phone])  [employer]: Virtual Classes Mondays and Thursdays Jeanette Caprice classes Tuesday, Wednesday and Thursday (contact [place] at [email or web address] ?or [phone])  [place]  Varied levels of classes are offered Tuesdays and Thursdays at [employer].   Stretching with Verdis Frederickson weekly class is also offered for people with Parkinson's  To observe a class or for more information, call [phone] or email Hezzie Bump at [email or web address]   ADDITIONAL SUPPORT AND RESOURCES  [employer]:Online Caregiver Education Opportunities:  www.well-[email or web address]/caregiver-education/caregiver-support-group.  You may also contact Vickki Muff at [email or web address] or [phone].     [employer] Navigator:  Just1Navigator program, a?free service to help individuals and families through the journey of determining care for older adults.  The "Navigator" is a Education officer, museum, Arnell Asal, who will speak with a prospective client and/or loved ones to provide an assessment of the situation and a set of recommendations for a personalized care plan -- all free of charge, and whether?[employer] offers the needed service or not. If the need is not a service we provide, we are well-connected with reputable programs in town that we can refer you to.  www.well-[email or web address] or to speak with the Navigator, call [phone].

## 2022-04-12 NOTE — Telephone Encounter (Signed)
Pt called to schedule and requesting to increase Buspar to next dose. Neurologist Dr Tat mentioned the dose is very low and mentioned would benefit her to increase. Contact Pt @ [phone]. Apt 12/28

## 2022-04-12 NOTE — Telephone Encounter (Signed)
She can increase to 5mg  TID.

## 2022-04-13 ENCOUNTER — Other Ambulatory Visit: Payer: Self-pay

## 2022-04-13 MED ORDER — BUSPIRONE HCL 5 MG PO TABS: 5.0000 mg | ORAL_TABLET | Freq: Three times a day (TID) | ORAL | 1 refills | Status: DC

## 2022-04-13 NOTE — Telephone Encounter (Signed)
LVM and sent updated rx

## 2022-04-19 ENCOUNTER — Other Ambulatory Visit: Payer: Self-pay

## 2022-04-19 MED ORDER — BUSPIRONE HCL 5 MG PO TABS: 5.0000 mg | ORAL_TABLET | Freq: Three times a day (TID) | ORAL | 1 refills | Status: DC

## 2022-04-21 ENCOUNTER — Other Ambulatory Visit: Payer: Self-pay

## 2022-04-21 ENCOUNTER — Emergency Department (HOSPITAL_BASED_OUTPATIENT_CLINIC_OR_DEPARTMENT_OTHER)
Admission: EM | Admit: 2022-04-21 | Discharge: 2022-04-21 | Disposition: A | Discharge status: Home / Self Care | Payer: Medicare PPO | Attending: Emergency Medicine | Admitting: Emergency Medicine

## 2022-04-21 ENCOUNTER — Emergency Department (HOSPITAL_BASED_OUTPATIENT_CLINIC_OR_DEPARTMENT_OTHER): Payer: Medicare PPO

## 2022-04-21 ENCOUNTER — Encounter (HOSPITAL_BASED_OUTPATIENT_CLINIC_OR_DEPARTMENT_OTHER): Payer: Self-pay

## 2022-04-21 DIAGNOSIS — R000 Tachycardia, unspecified: Secondary | ICD-10-CM | POA: Insufficient documentation

## 2022-04-21 DIAGNOSIS — J40 Bronchitis, not specified as acute or chronic: Secondary | ICD-10-CM

## 2022-04-21 DIAGNOSIS — G20C Parkinsonism, unspecified: Secondary | ICD-10-CM | POA: Diagnosis not present

## 2022-04-21 DIAGNOSIS — R0602 Shortness of breath: Secondary | ICD-10-CM | POA: Diagnosis not present

## 2022-04-21 LAB — CBC WITH DIFFERENTIAL/PLATELET
Abs Immature Granulocytes: 0.01 10*3/uL (ref 0.00–0.07)
Basophils Absolute: 0.1 10*3/uL (ref 0.0–0.1)
Basophils Relative: 1 %
Eosinophils Absolute: 0.1 10*3/uL (ref 0.0–0.5)
Eosinophils Relative: 1 %
HCT: 39.2 % (ref 36.0–46.0)
Hemoglobin: 13.2 g/dL (ref 12.0–15.0)
Immature Granulocytes: 0 %
Lymphocytes Relative: 20 %
Lymphs Abs: 1.1 10*3/uL (ref 0.7–4.0)
MCH: 30.6 pg (ref 26.0–34.0)
MCHC: 33.7 g/dL (ref 30.0–36.0)
MCV: 90.7 fL (ref 80.0–100.0)
Monocytes Absolute: 0.4 10*3/uL (ref 0.1–1.0)
Monocytes Relative: 7 %
Neutro Abs: 4.1 10*3/uL (ref 1.7–7.7)
Neutrophils Relative %: 71 %
Platelets: 196 10*3/uL (ref 150–400)
RBC: 4.32 MIL/uL (ref 3.87–5.11)
RDW: 12.6 % (ref 11.5–15.5)
WBC: 5.7 10*3/uL (ref 4.0–10.5)
nRBC: 0 % (ref 0.0–0.2)

## 2022-04-21 LAB — I-STAT VENOUS BLOOD GAS, ED
Acid-Base Excess: 2 mmol/L (ref 0.0–2.0)
Bicarbonate: 24.2 mmol/L (ref 20.0–28.0)
Calcium, Ion: 1.17 mmol/L (ref 1.15–1.40)
HCT: 39 % (ref 36.0–46.0)
Hemoglobin: 13.3 g/dL (ref 12.0–15.0)
O2 Saturation: 50 %
Potassium: 3.6 mmol/L (ref 3.5–5.1)
Sodium: 135 mmol/L (ref 135–145)
TCO2: 25 mmol/L (ref 22–32)
pCO2, Ven: 30.9 mmHg — ABNORMAL LOW (ref 44–60)
pH, Ven: 7.503 — ABNORMAL HIGH (ref 7.25–7.43)
pO2, Ven: 24 mmHg — CL (ref 32–45)

## 2022-04-21 LAB — COMPREHENSIVE METABOLIC PANEL
ALT: 29 U/L (ref 0–44)
AST: 25 U/L (ref 15–41)
Albumin: 4 g/dL (ref 3.5–5.0)
Alkaline Phosphatase: 59 U/L (ref 38–126)
Anion gap: 9 (ref 5–15)
BUN: 18 mg/dL (ref 8–23)
CO2: 23 mmol/L (ref 22–32)
Calcium: 8.8 mg/dL — ABNORMAL LOW (ref 8.9–10.3)
Chloride: 100 mmol/L (ref 98–111)
Creatinine, Ser: 0.58 mg/dL (ref 0.44–1.00)
GFR, Estimated: >60 mL/min (ref >60)
Glucose, Bld: 116 mg/dL — ABNORMAL HIGH (ref 70–99)
Potassium: 3.8 mmol/L (ref 3.5–5.1)
Sodium: 132 mmol/L — ABNORMAL LOW (ref 135–145)
Total Bilirubin: 0.5 mg/dL (ref 0.3–1.2)
Total Protein: 6.2 g/dL — ABNORMAL LOW (ref 6.5–8.1)

## 2022-04-21 LAB — D-DIMER, QUANTITATIVE: D-Dimer, Quant: 0.33 ug/mL-FEU (ref 0.00–0.50)

## 2022-04-21 LAB — BRAIN NATRIURETIC PEPTIDE: B Natriuretic Peptide: 92.6 pg/mL (ref 0.0–100.0)

## 2022-04-21 LAB — TROPONIN I (HIGH SENSITIVITY): Troponin I (High Sensitivity): 3 ng/L (ref <18)

## 2022-04-21 MED ORDER — ALUM & MAG HYDROXIDE-SIMETH 200-200-20 MG/5ML PO SUSP: 30.0000 mL | Freq: Once | ORAL | Status: DC

## 2022-04-21 MED ORDER — DOXYCYCLINE HYCLATE 100 MG PO CAPS: 100.0000 mg | ORAL_CAPSULE | Freq: Two times a day (BID) | ORAL | 0 refills | Status: DC

## 2022-04-21 MED ORDER — ALBUTEROL SULFATE HFA 108 (90 BASE) MCG/ACT IN AERS
2.0000 | INHALATION_SPRAY | Freq: Once | RESPIRATORY_TRACT | Status: AC
  Administered 2022-04-21: 2 via RESPIRATORY_TRACT
  Filled 2022-04-21: qty 6.7

## 2022-04-21 MED ORDER — DOXYCYCLINE HYCLATE 100 MG PO TABS
100.0000 mg | ORAL_TABLET | Freq: Once | ORAL | Status: AC
  Administered 2022-04-21: 100 mg via ORAL
  Filled 2022-04-21: qty 1

## 2022-04-21 NOTE — ED Notes (Signed)
Sats 100 % while ambulating but  resp rate increased

## 2022-04-21 NOTE — ED Provider Notes (Signed)
  Physical Exam  BP (!) 153/88   Pulse 87   Temp 98 F (36.7 C)   Resp 19   Ht 5\' 3"  (1.6 m)   Wt 57.3 kg   SpO2 96%   BMI 22.38 kg/m   Physical Exam  Procedures  Procedures  ED Course / MDM   Clinical Course as of 04/21/22 1849  Wed Apr 21, 2022  1506 71 F with pmh Parkinsons who presents with worsening dyspnea on exertion and orthopnea. CXR negative. Follow up D Dimer and Troponins.  [VB]  1615 Patient's overall medical workup was generally unremarkable.  Her chest x-ray showed no pneumonia, no pleural effusion, no pneumothorax.  Her D-dimer was negative, PE unlikely.  BMP generally unremarkable 92.  Troponin 3 and unremarkable unlikely atypical ACS.  Due to her Parkinson's and mildly elevated respiratory rate and pulse rate, obtained Neff and vital capacity which were severely low.  Her vital capacity was 1.65 L and NIF -18, per RT good numbers for age.  [VB]  Apr 23, 2022 Reassessed patient who is breathing comfortably satting at 100% on room air with no increased work of breathing.  She ambulated with no hypoxia or increased work of breathing.  Her VBG was unremarkable she had respiratory alkalosis pCO2 30, pH 7.5.  Did appreciate some mild wheeze on exam.  Suggest possible mild bronchitis.  Will give albuterol inhaler and doxycycline prescription and referral to pulmonology.  Discussed strict return precautions.  Patient in agreement with plan and safe for discharge at this time. [VB]    Clinical Course User Index [VB] [id number], MD   Medical Decision Making Amount and/or Complexity of Data Reviewed Labs: ordered. Radiology: ordered.  Risk OTC drugs. Prescription drug management.          Mardene Sayer, MD 04/21/22 1850

## 2022-04-21 NOTE — Discharge Instructions (Signed)
You were seen for shortness of breath.  Overall your workup including chest x-ray, EKG, labs and physical exam are all reassuring.  You did have a mild wheeze.  We will treat you for possible bronchitis with antibiotics doxycycline and albuterol inhaler.  Use the albuterol inhaler as needed for shortness of breath or wheezing every 4-6 hours with 2 puffs.  If your symptoms are not improving or continue to worsen you can either come back to the ER or make an appointment with the pulmonologist for further workup and testing.

## 2022-04-21 NOTE — ED Provider Notes (Signed)
[facility] EMERGENCY DEPT Provider Note   CSN: [id number] Arrival date & time: 04/21/22  1343     History  Chief Complaint  Patient presents with   Shortness of Breath    Morgan Reese is a 71 y.o. female.  71 yo F with a cc of sob. Going on for a few days but worse last night.  Occurred shortly after dinner, thought maybe she ate too much. Better with movement and worse at rest.  Worse with lying flat.  No obvious cough, no fevers.  No known sick contacts.  No trauma.     Patient denies history of MI, denies hypertension hyperlipidemia diabetes or smoking.  Denies family history of MI.  Patient denies history of PE or DVT denies hemoptysis denies unilateral lower extremity edema denies recent surgery immobilization hospitalization use or history of cancer. Patient takes estrogen.      Shortness of Breath      Home Medications Prior to Admission medications   Medication Sig Start Date End Date Taking? Authorizing Provider  Ascorbic Acid (VITAMIN C) 1000 MG tablet Take 1,000 mg by mouth daily.    [provider]  busPIRone (BUSPAR) 5 MG tablet Take 1 tablet (5 mg total) by mouth 3 (three) times daily. 04/19/22   Mozingo, Thereasa Solo, NP  Cholecalciferol (VITAMIN D3 PO) Take by mouth. Alternates 10,000 units with 5,000 units each day.    [provider]  COPPER PO Take 30 mg by mouth 2 (two) times a week.    [provider]  estradiol (ESTRACE) 0.1 MG/GM vaginal cream Place 1 Applicatorful vaginally at bedtime.    [provider]  estradiol (VIVELLE-DOT) 0.0375 MG/24HR Place 1 patch onto the skin 2 (two) times a week. 04/14/21   [provider]  gabapentin (NEURONTIN) 300 MG capsule Take by mouth. 04/02/22   [provider]  levothyroxine (SYNTHROID) 50 MCG tablet Take 50 mcg by mouth daily before breakfast.    [provider]  Magnesium Citrate 200 MG TABS Take 600 mg by mouth daily.     [provider]  melatonin 3 MG TABS tablet Take 6-9 mg by mouth at bedtime.    [provider]  NON FORMULARY Apply 2 % topically daily.    [provider]  Omega-3 Fatty Acids (FISH OIL) 1000 MG CAPS Take 1 capsule by mouth at bedtime.    [provider]  progesterone (PROMETRIUM) 200 MG capsule Take 200 mg by mouth daily.    [provider]  pyridOXINE (B-6) 50 MG tablet Take 50 mg by mouth daily.    [provider]  rasagiline (AZILECT) 1 MG TABS tablet Take 1 tablet (1 mg total) by mouth daily. 04/12/22   TatOctaviano Batty, DO  thiamine 100 MG tablet Take 100 mg by mouth daily.    [provider]  VITAMIN K PO Take by mouth daily. MK7    [provider]  zinc gluconate 50 MG tablet Take 50 mg by mouth daily.    [provider]      Allergies    Amoxicillin and Serotonin reuptake inhibitors (ssris)    Review of Systems   Review of Systems  Respiratory:  Positive for shortness of breath.     Physical Exam Updated Vital Signs BP 120/62 (BP Location: Right Arm)   Pulse (!) 103   Temp 98 F (36.7 C)   Resp (!) 22   Ht 5\' 3"  (1.6 m)  Wt 57.3 kg   SpO2 100%   BMI 22.38 kg/m  Physical Exam Vitals and nursing note reviewed.  Constitutional:      General: She is not in acute distress.    Appearance: She is well-developed. She is not diaphoretic.  HENT:     Head: Normocephalic and atraumatic.  Eyes:     Pupils: Pupils are equal, round, and reactive to light.  Cardiovascular:     Rate and Rhythm: Normal rate and regular rhythm.     Heart sounds: No murmur heard.    No friction rub. No gallop.  Pulmonary:     Effort: Pulmonary effort is normal.     Breath sounds: No wheezing or rales.  Abdominal:     General: There is no distension.     Palpations: Abdomen is soft.     Tenderness: There is no abdominal tenderness.  Musculoskeletal:        General: No tenderness.     Cervical back: Normal range  of motion and neck supple.  Skin:    General: Skin is warm and dry.  Neurological:     Mental Status: She is alert and oriented to person, place, and time.     Comments: Reflexes in bilateral lower extremities 2+.  No clonus.  2+ dorsalis pedis pulses.  Psychiatric:        Behavior: Behavior normal.     ED Results / Procedures / Treatments   Labs (all labs ordered are listed, but only abnormal results are displayed) Labs Reviewed  COMPREHENSIVE METABOLIC PANEL - Abnormal; Notable for the following components:      Result Value   Sodium 132 (*)    Glucose, Bld 116 (*)    Calcium 8.8 (*)    Total Protein 6.2 (*)    All other components within normal limits  CBC WITH DIFFERENTIAL/PLATELET  BRAIN NATRIURETIC PEPTIDE  D-DIMER, QUANTITATIVE  TROPONIN I (HIGH SENSITIVITY)    EKG EKG Interpretation  Date/Time:  Wednesday April 21 2022 13:52:08 EST Ventricular Rate:  105 PR Interval:  90 QRS Duration: 68 QT Interval:  290 QTC Calculation: 383 R Axis:   59 Text Interpretation: Sinus tachycardia with short PR Nonspecific ST and T wave abnormality Abnormal ECG No significant change since last tracing Confirmed by Melene Plan [id number]) on 04/21/2022 2:32:46 PM  Radiology DG Chest Port 1 View  Result Date: 04/21/2022 CLINICAL DATA:  Shortness of breath EXAM: PORTABLE CHEST 1 VIEW COMPARISON:  04/21/2022 at 1159 hours FINDINGS: The heart size and mediastinal contours are within normal limits. Aortic atherosclerosis. No focal airspace consolidation, pleural effusion, or pneumothorax. The visualized skeletal structures are unremarkable. IMPRESSION: No active disease. Electronically Signed   By: Duanne Guess D.O.   On: 04/21/2022 14:33    Procedures Procedures    Medications Ordered in ED Medications  alum & mag hydroxide-simeth (MAALOX/MYLANTA) 200-200-20 MG/5ML suspension 30 mL (0 mLs Oral Hold 04/21/22 1502)    ED Course/ Medical Decision Making/ A&P Clinical Course as of  04/21/22 1520  Wed Apr 21, 2022  1506 71 F with pmh Parkinsons who presents with worsening dyspnea on exertion and orthopnea. CXR negative. Follow up D Dimer and Troponins.  [VB]    Clinical Course User Index [VB] Mardene Sayer, MD                           Medical Decision Making Amount and/or Complexity of Data Reviewed Labs: ordered. Radiology: ordered.  Risk OTC drugs.   71 yo F with a cc of sob.  Going on for the past few days but worsening last night after eating.  Worsens with rest and improves with movement.  Mild tachypnea on exam.  GI cocktail, ACS workup including ddimer.    CXR independently interpreted by me without focal infiltrate or ptx.   Patient care signed out to Dr. Elpidio Anis, please see their note for further details of care in the ED.  The patients results and plan were reviewed and discussed.   Any x-rays performed were independently reviewed by myself.   Differential diagnosis were considered with the presenting HPI.  Medications  alum & mag hydroxide-simeth (MAALOX/MYLANTA) 200-200-20 MG/5ML suspension 30 mL (0 mLs Oral Hold 04/21/22 1502)    Vitals:   04/21/22 1350 04/21/22 1354  BP:  120/62  Pulse:  (!) 103  Resp:  (!) 22  Temp:  98 F (36.7 C)  SpO2:  100%  Weight: 57.3 kg   Height: 5\' 3"  (1.6 m)     Final diagnoses:  SOB (shortness of breath)           Final Clinical Impression(s) / ED Diagnoses Final diagnoses:  SOB (shortness of breath)    Rx / DC Orders ED Discharge Orders     None         , DO 04/21/22 1520

## 2022-04-21 NOTE — ED Notes (Signed)
Pt given ice water, per [place] - Charity fundraiser.

## 2022-04-21 NOTE — ED Triage Notes (Signed)
Patient here POV from Home.  Noted some SOB Yesterday that worsened at Night. Some Cough as well.   [facility] today and was sent for Evaluation. No Known fevers.   NAD Noted during Triage. A&Ox4. GCS 15. Ambulatory.

## 2022-04-21 NOTE — Progress Notes (Signed)
Patient performed a VC of 1.65 L on first attempt and a NIF of -18 with good effort, I took the best of 3 attempts.

## 2022-04-27 ENCOUNTER — Telehealth: Payer: Self-pay | Admitting: Anesthesiology

## 2022-04-27 NOTE — Telephone Encounter (Signed)
Pt's husband called stating they have not been able to get Azilect Rx filled because it needs a PA. He stated the pharmacy has sent several faxes to the office about the PA and have not responded.

## 2022-04-30 DIAGNOSIS — R1310 Dysphagia, unspecified: Secondary | ICD-10-CM | POA: Diagnosis not present

## 2022-04-30 DIAGNOSIS — H532 Diplopia: Secondary | ICD-10-CM | POA: Diagnosis not present

## 2022-04-30 DIAGNOSIS — G20C Parkinsonism, unspecified: Secondary | ICD-10-CM | POA: Diagnosis not present

## 2022-05-03 ENCOUNTER — Telehealth: Payer: Self-pay

## 2022-05-03 ENCOUNTER — Other Ambulatory Visit (HOSPITAL_COMMUNITY): Payer: Self-pay

## 2022-05-03 DIAGNOSIS — G20A1 Parkinson's disease without dyskinesia, without mention of fluctuations: Secondary | ICD-10-CM

## 2022-05-03 NOTE — Telephone Encounter (Signed)
Patient Advocate Encounter   Received notification from [facility] that prior authorization is required for Azilect 1MG  tablets  Submitted: 05-03-2022 Key [id number]  Status is pending

## 2022-05-04 ENCOUNTER — Ambulatory Visit: Payer: Medicare PPO | Admitting: Neurology

## 2022-05-04 DIAGNOSIS — R202 Paresthesia of skin: Secondary | ICD-10-CM

## 2022-05-05 NOTE — Telephone Encounter (Signed)
Patient Advocate Encounter  Received a fax from [facility] regarding Prior Authorization for Azilect 1MG  tablets .   Authorization has been DENIED due to patient does not have documentation of a try/fail, contraindication, or intolerance of selegiline.  Determination letter attached to patients chart

## 2022-05-05 NOTE — Procedures (Signed)
  [facility]  [address] [place], [address]  [place], [place] [address] Tel: [phone] Fax: [phone] Test Date:  05/04/2022  Patient: Morgan Reese DOB: [birth year] Physician: Nita Sickle, DO  Sex: Female Height: 5\' 3"  Ref Phys: , MD  ID#: Marcene Corning   Technician:    History: This is a 71 year-old female referred for evaluation of right leg pain.  NCV & EMG Findings: Extensive electrodiagnostic testing of the right lower extremity shows: Right sural and superficial peroneal sensory responses are within normal limits. Right peroneal motor response is within normal limits.  Right tibial motor response is reduced (1.2 mV). Right tibial H-reflex study is absent. Chronic motor axon loss changes are seen affecting the S1 myotome on the right.   Impression: Chronic S1 radiculopathy affecting the right leg, mild.  There is no evidence of a sensorimotor polyneuropathy affecting the right leg.    ___________________________ 61, DO    Nerve Conduction Studies   Stim Site NR Peak (ms) Norm Peak (ms) O-P Amp (V) Norm O-P Amp  Right Sup Peroneal Anti Sensory (Ant Lat Mall)  33 C  12 cm    4.0 <4.6 4.2 >3  Right Sural Anti Sensory (Lat Mall)  33 C  Calf    2.7 <4.6 6.0 >3     Stim Site NR Onset (ms) Norm Onset (ms) O-P Amp (mV) Norm O-P Amp Site1 Site2 Delta-0 (ms) Dist (cm) Vel (m/s) Norm Vel (m/s)  Right Peroneal Motor (Ext Dig Brev)  33 C  Ankle    6.0 <6.0 3.3 >2.5 B Fib Ankle 8.2 30.0 41 >40  B Fib    14.2  3.2  Poplt B Fib 1.7 7.0 41 >40  Poplt    15.9  2.9         Right Tibial Motor (Abd Hall Brev)  33 C  Ankle    6.0 <6.0 *1.2 >4 Knee Ankle 10.0 41.0 41 >40  Knee    17.3  1.5          Electromyography   Side Muscle Ins.Act Fibs Fasc Recrt Amp Dur Poly Activation Comment  Right AntTibialis Nml Nml Nml Nml Nml Nml Nml Nml N/A  Right Gastroc Nml Nml Nml *1- *1+ *1+ *1+ Nml N/A  Right Flex Dig Long Nml Nml Nml Nml Nml Nml Nml  Nml N/A  Right RectFemoris Nml Nml Nml Nml Nml Nml Nml Nml N/A  Right BicepsFemS Nml Nml Nml *1- *1+ *1+ *1+ Nml N/A  Right GluteusMed Nml Nml Nml Nml Nml Nml Nml Nml N/A      Waveforms:

## 2022-05-10 DIAGNOSIS — M722 Plantar fascial fibromatosis: Secondary | ICD-10-CM | POA: Diagnosis not present

## 2022-05-12 MED ORDER — RASAGILINE MESYLATE 1 MG PO TABS: 1.0000 mg | ORAL_TABLET | Freq: Every day | ORAL | 1 refills | Status: DC

## 2022-05-12 NOTE — Telephone Encounter (Signed)
Patient agreed to [place] sending medication to them today

## 2022-05-13 DIAGNOSIS — R1310 Dysphagia, unspecified: Secondary | ICD-10-CM | POA: Diagnosis not present

## 2022-05-13 DIAGNOSIS — H532 Diplopia: Secondary | ICD-10-CM | POA: Diagnosis not present

## 2022-05-13 DIAGNOSIS — R42 Dizziness and giddiness: Secondary | ICD-10-CM | POA: Diagnosis not present

## 2022-05-13 DIAGNOSIS — R519 Headache, unspecified: Secondary | ICD-10-CM | POA: Diagnosis not present

## 2022-05-18 ENCOUNTER — Encounter: Payer: Self-pay | Admitting: Cardiovascular Disease

## 2022-05-18 DIAGNOSIS — R002 Palpitations: Secondary | ICD-10-CM

## 2022-05-19 ENCOUNTER — Ambulatory Visit: Payer: Medicare PPO | Attending: Cardiovascular Disease

## 2022-05-19 DIAGNOSIS — R002 Palpitations: Secondary | ICD-10-CM

## 2022-05-19 NOTE — Progress Notes (Unsigned)
Enrolled patient for a 14 day Zio XT  monitor to be mailed to patients home  °

## 2022-05-19 NOTE — Telephone Encounter (Signed)
Please order a 14 day monitor for palpitations. OK to continue with rehab as planned.

## 2022-05-20 ENCOUNTER — Telehealth (INDEPENDENT_AMBULATORY_CARE_PROVIDER_SITE_OTHER): Payer: Medicare PPO | Admitting: Adult Health

## 2022-05-20 ENCOUNTER — Encounter: Payer: Self-pay | Admitting: Adult Health

## 2022-05-20 DIAGNOSIS — F411 Generalized anxiety disorder: Secondary | ICD-10-CM | POA: Diagnosis not present

## 2022-05-20 MED ORDER — BUSPIRONE HCL 10 MG PO TABS: 10.0000 mg | ORAL_TABLET | Freq: Three times a day (TID) | ORAL | 2 refills | Status: DC

## 2022-05-20 MED ORDER — GABAPENTIN 300 MG PO CAPS: 300.0000 mg | ORAL_CAPSULE | Freq: Three times a day (TID) | ORAL | 2 refills | Status: DC

## 2022-05-20 NOTE — Progress Notes (Signed)
Morgan Reese [id number] [birth year] 71 y.o.  Virtual Visit via Video Note  I connected with pt @ on 05/20/22 at  2:00 PM EST by a video enabled telemedicine application and verified that I am speaking with the correct person using two identifiers.   I discussed the limitations of evaluation and management by telemedicine and the availability of in person appointments. The patient expressed understanding and agreed to proceed.  I discussed the assessment and treatment plan with the patient. The patient was provided an opportunity to ask questions and all were answered. The patient agreed with the plan and demonstrated an understanding of the instructions.   The patient was advised to call back or seek an in-person evaluation if the symptoms worsen or if the condition fails to improve as anticipated.  I provided 20 minutes of non-face-to-face time during this encounter.  The patient was located at home.  The provider was located at [facility].   Dorothyann Gibbs, NP   Subjective:   Patient ID:  Morgan Reese is a 71 y.o. (DOB [birth year]) female.  Chief Complaint: No chief complaint on file.   HPI [name] presents for follow-up of anxiety disorder.    Describes mood today as "ok". Pleasant. Reports one tearful episode. Mood symptoms - reports some depression - "when thinking about taking the Carbidopa Levodopa". Reports increased anxiety. Reports  irritability - "some of the time". Reports some worry and rumination - "mostly about medications". Has started taking the Carbidopa Levodopa. Stating "I'm scared about this whole process". Feels like the addition of Buspar has been helpful - willing to increase Buspar from 5mg  TID to 10mg  TID. Improved interest and motivation. Taking medications as prescribed. Energy levels improved. Active, has a regular exercise routine - going to the gym. Enjoys some usual interests and activities. Married - lives with husband.  Has 2 adult children. Spending time with family.  Appetite adequate. Weight gain - 124 to 127 pounds. Sleeps well most nights. Averages 7 to 8 hours. Reports some daytime napping. Focus and concentration - distracted when anxiety ridden. Completing tasks. Managing aspects of household. Retired. Denies SI or HI.  Denies AH or VH. Denies paranoia.   Review of Systems:  Review of Systems  Musculoskeletal:  Negative for gait problem.  Neurological:  Negative for tremors.  Psychiatric/Behavioral:         Please refer to HPI    Medications: I have reviewed the patient's current medications.  Current Outpatient Medications  Medication Sig Dispense Refill   Ascorbic Acid (VITAMIN C) 1000 MG tablet Take 1,000 mg by mouth daily.     busPIRone (BUSPAR) 10 MG tablet Take 1 tablet (10 mg total) by mouth 3 (three) times daily. 90 tablet 2   Cholecalciferol (VITAMIN D3 PO) Take by mouth. Alternates 10,000 units with 5,000 units each day.     COPPER PO Take 30 mg by mouth 2 (two) times a week.     doxycycline (VIBRAMYCIN) 100 MG capsule Take 1 capsule (100 mg total) by mouth 2 (two) times daily. 19 capsule 0   estradiol (ESTRACE) 0.1 MG/GM vaginal cream Place 1 Applicatorful vaginally at bedtime.     estradiol (VIVELLE-DOT) 0.0375 MG/24HR Place 1 patch onto the skin 2 (two) times a week.     gabapentin (NEURONTIN) 300 MG capsule Take 1 capsule (300 mg total) by mouth 3 (three) times daily. 90 capsule 2   levothyroxine (SYNTHROID) 50 MCG tablet Take 50 mcg by mouth daily before  breakfast.     Magnesium Citrate 200 MG TABS Take 600 mg by mouth daily.     melatonin 3 MG TABS tablet Take 6-9 mg by mouth at bedtime.     NON FORMULARY Apply 2 % topically daily.     Omega-3 Fatty Acids (FISH OIL) 1000 MG CAPS Take 1 capsule by mouth at bedtime.     progesterone (PROMETRIUM) 200 MG capsule Take 200 mg by mouth daily.     pyridOXINE (B-6) 50 MG tablet Take 50 mg by mouth daily.     rasagiline (AZILECT) 1  MG TABS tablet Take 1 tablet (1 mg total) by mouth daily. 90 tablet 1   thiamine 100 MG tablet Take 100 mg by mouth daily.     VITAMIN K PO Take by mouth daily. MK7     zinc gluconate 50 MG tablet Take 50 mg by mouth daily.     No current facility-administered medications for this visit.    Medication Side Effects: None  Allergies:  Allergies  Allergen Reactions   Amoxicillin Swelling    REACTION: Head felt over-heated Other reaction(s): Other (See Comments) REACTION: Head felt over-heated    Serotonin Reuptake Inhibitors (Ssris) Other (See Comments)    Other reaction(s): hyponatremia SSRI's are contraindicated, causes hyponatremia     Past Medical History:  Diagnosis Date   Hypertension    Parkinson's disease    Sinus congestion    Thyroid disease     Family History  Problem Relation Age of Onset   Tremor Mother    Depression Mother    Cancer Father        Lung Cancer   Tremor Maternal Grandmother    Diabetes Paternal Grandmother    Stroke Paternal Uncle    Healthy Child    Parkinson's disease Neg Hx     Social History   Socioeconomic History   Marital status: Married    Spouse name: Not on file   Number of children: 2   Years of education: Not on file   Highest education level: Not on file  Occupational History   Occupation: retired    Comment: PhD; early childhood education  Tobacco Use   Smoking status: Never   Smokeless tobacco: Never  Vaping Use   Vaping Use: Never used  Substance and Sexual Activity   Alcohol use: Never   Drug use: Never   Sexual activity: Not on file  Other Topics Concern   Not on file  Social History Narrative   Right handed   Caffeine: none    Lives in a two story home with main bedroom on first floor.    Social Determinants of Health   Financial Resource Strain: Not on file  Food Insecurity: Not on file  Transportation Needs: Not on file  Physical Activity: Not on file  Stress: Not on file  Social Connections:  Not on file  Intimate Partner Violence: Not on file    Past Medical History, Surgical history, Social history, and Family history were reviewed and updated as appropriate.   Please see review of systems for further details on the patient's review from today.   Objective:   Physical Exam:  There were no vitals taken for this visit.  Physical Exam Constitutional:      General: She is not in acute distress. Musculoskeletal:        General: No deformity.  Neurological:     Mental Status: She is alert and oriented to person, place, and time.  Coordination: Coordination normal.  Psychiatric:        Attention and Perception: Attention and perception normal. She does not perceive auditory or visual hallucinations.        Mood and Affect: Mood normal. Mood is not anxious or depressed. Affect is not labile, blunt, angry or inappropriate.        Speech: Speech normal.        Behavior: Behavior normal.        Thought Content: Thought content normal. Thought content is not paranoid or delusional. Thought content does not include homicidal or suicidal ideation. Thought content does not include homicidal or suicidal plan.        Cognition and Memory: Cognition and memory normal.        Judgment: Judgment normal.     Comments: Insight intact     Lab Review:     Component Value Date/Time   NA 135 04/21/2022 1636   K 3.6 04/21/2022 1636   CL 100 04/21/2022 1410   CO2 23 04/21/2022 1410   GLUCOSE 116 (H) 04/21/2022 1410   BUN 18 04/21/2022 1410   CREATININE 0.58 04/21/2022 1410   CALCIUM 8.8 (L) 04/21/2022 1410   PROT 6.2 (L) 04/21/2022 1410   ALBUMIN 4.0 04/21/2022 1410   AST 25 04/21/2022 1410   ALT 29 04/21/2022 1410   ALKPHOS 59 04/21/2022 1410   BILITOT 0.5 04/21/2022 1410   GFRNONAA >60 04/21/2022 1410       Component Value Date/Time   WBC 5.7 04/21/2022 1410   RBC 4.32 04/21/2022 1410   HGB 13.3 04/21/2022 1636   HCT 39.0 04/21/2022 1636   PLT 196 04/21/2022 1410    MCV 90.7 04/21/2022 1410   MCH 30.6 04/21/2022 1410   MCHC 33.7 04/21/2022 1410   RDW 12.6 04/21/2022 1410   LYMPHSABS 1.1 04/21/2022 1410   MONOABS 0.4 04/21/2022 1410   EOSABS 0.1 04/21/2022 1410   BASOSABS 0.1 04/21/2022 1410    No results found for: "POCLITH", "LITHIUM"   No results found for: "PHENYTOIN", "PHENOBARB", "VALPROATE", "CBMZ"   .res Assessment: Plan:    Plan:  Increase Buspar 5mg  to 10mg  - three times daily for anxiety  Will assume Gabapentin 300mg  TID - anxiety  Started Carbidopa Levodopa  PDMP reviewed  Upcoming appt with pain management - [place] pain  Discussed potential benefits, risk, and side effects of benzodiazepines to include potential risk of tolerance and dependence, as well as possible drowsiness. Advised patient not to drive if experiencing drowsiness and to take lowest possible effective dose to minimize risk of dependence and tolerance.   Time spent with patient was 20 minutes. Greater than 50% of face to face time with patient was spent on counseling and coordination of care.    RTC 4 weeks   Diagnoses and all orders for this visit:  Generalized anxiety disorder -     busPIRone (BUSPAR) 10 MG tablet; Take 1 tablet (10 mg total) by mouth 3 (three) times daily. -     gabapentin (NEURONTIN) 300 MG capsule; Take 1 capsule (300 mg total) by mouth 3 (three) times daily.     Please see After Visit Summary for patient specific instructions.  Future Appointments  Date Time Provider Department Center  05/26/2022  1:00 PM , LCSW [facility] None  10/19/2022  2:30 PM Tat, 07/25/2022, DO [id number] None    No orders of the defined types were placed in this encounter.     -------------------------------

## 2022-05-21 ENCOUNTER — Telehealth: Payer: Self-pay | Admitting: Cardiovascular Disease

## 2022-05-21 DIAGNOSIS — R262 Difficulty in walking, not elsewhere classified: Secondary | ICD-10-CM | POA: Diagnosis not present

## 2022-05-21 DIAGNOSIS — M6281 Muscle weakness (generalized): Secondary | ICD-10-CM | POA: Diagnosis not present

## 2022-05-21 NOTE — Telephone Encounter (Signed)
Otho Bellows and [place]!  Would it be possible to get her in for a visit to have her monitor placed?   Thanks

## 2022-05-21 NOTE — Telephone Encounter (Signed)
Patient states she is unable to apply her heart monitor due to Parkinson's.

## 2022-05-21 NOTE — Telephone Encounter (Signed)
Patient states her husband is going to attempt applying her ZIO XT monitor. I told Ms. Morgan Reese, if they should have any problems with the application, give me a call on Tuesday.  We would be able to have her come to our [address] office on Wednesday to have a replacement put on in the office.

## 2022-05-22 DIAGNOSIS — R002 Palpitations: Secondary | ICD-10-CM | POA: Diagnosis not present

## 2022-05-26 ENCOUNTER — Ambulatory Visit (INDEPENDENT_AMBULATORY_CARE_PROVIDER_SITE_OTHER): Payer: Medicare PPO | Admitting: Psychiatry

## 2022-05-26 DIAGNOSIS — F411 Generalized anxiety disorder: Secondary | ICD-10-CM | POA: Diagnosis not present

## 2022-05-26 NOTE — Progress Notes (Signed)
[facility] Counselor/Therapist Progress Note  Patient ID: Morgan Reese, MRN: [id number],    Date: 05/26/2022  Time Spent: 50 minutes   Treatment Type: Individual Therapy  Virtual Visit via Telehealth Note: MyChart Video session Connected with patient by a telemedicine/telehealth application, with their informed consent, and verified patient privacy and that I am speaking with the correct person using two identifiers. I discussed the limitations, risks, security and privacy concerns of performing psychotherapy and the availability of in person appointments. I also discussed with the patient that there may be a patient responsible charge related to this service. The patient expressed understanding and agreed to proceed. I discussed the treatment planning with the patient. The patient was provided an opportunity to ask questions and all were answered. The patient agreed with the plan and demonstrated an understanding of the instructions. The patient was advised to call  our office if  symptoms worsen or feel they are in a crisis state and need immediate contact.   Therapist Location: office Patient Location: home   Reported Symptoms: frustration, anxiety, some agitation, adjusting to new medication for her Parkinson's and feeds meds contributes to some shakiness and increased anxiety. Some depression noted.  Mental Status Exam:  Appearance:   Casual     Behavior:  Appropriate, Sharing, and Motivated  Motor:  Normal (but affected some by her Parkinson's)  Speech/Language:   Clear and Coherent  Affect:  Depressed and anxious  Mood:  anxious, depressed, and some anger and frustration  Thought process:  goal directed  Thought content:    Rumination  Sensory/Perceptual disturbances:    WNL  Orientation:  oriented to person, place, time/date, situation, day of week, month of year, year, and stated date of Jan. 3, 2024  Attention:  Good  Concentration:  Good  Memory:  Reports some  short term forgetting at times  Fund of knowledge:   Good  Insight:    Good and Fair  Judgment:   Good  Impulse Control:  Good   Risk Assessment: Danger to Self:  No Self-injurious Behavior: No Danger to Others: No Duty to Warn:no Physical Aggression / Violence:No  Access to Firearms a concern: No  Gang Involvement:No   Subjective:  Patient reporting anxiety, frustration, some agitation. Difficulties adjusting with new medication changes. "I like for things to go smoothly but in my life now, it's hard to always make things go smoothly, and when you're dealing with meds you'll have surprises."  States how she feels lonely at times and is appreciative that we can still have her sessions online. "Some days are better and some are worse." Explained how some of her more difficult days go as compared to a less difficult day. Described challenges with her health condition and at one point was able to laugh and admitted she has not lost her sense of humor. One of her bigger issues right now is adjusting to the fact she can't control everything, "things that are out of my control" bother me especially "with my personality". Went to gym today, but hasn't been able to be as regular more recently. Wearing heart monitor currently. States "I need more self-acceptance, and it's easy for me to judge myself." Acknowledges she needs to be more self-loving and understanding and "allow myself to feel peaceful" by not judging myself negatively and by being more self-accepting." Discussed this in more detail which was helpful for patient. Definitely showing more strength today in working on goal-directed behaviors in order to  continue her progress.  Interventions: Cognitive Behavioral Therapy and Ego-Supportive  Long-term goal: Reduce overall level, frequncy, and intensity of the anxiety so that daily functioning is not impaired.  Short-term goal: Increase understanding of beliefs and messages that produces the  worry and anxiety.  Describe current and past experiences with specific fears, prominent worries, and anxiety symptoms including their impact on functioning and attempts to resolve it.  Strategies: Explore cognitive messages that mediate anxiety response and retrain in adaptive cognitions.  Diagnosis:   ICD-10-CM   1. Generalized anxiety disorder  F41.1      Plan: As noted above, patient did very well and working with goal-directed behaviors today and working on her goals especially regarding some feelings of fear, anxiety, and worry, that have some origin in her childhood and into adulthood.  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  Was more open today as she talked about the past including at least 2 specific examples of difficult situations she has confided in earlier years that could be related to some of the feelings she experiences now.  Current health challenges are difficult and she seemed to appreciate the support and strategies that may help her in managing some of her health concerns and discomfort.  Particularly noticed her hard work on ways she can verbally be hard on herself and shared some examples for [place] to work with in session today.  Will follow-up with patient in approximately 2 to 3 weeks based on timing of other health appointments.  Encouraged her to use some journaling in the meantime and she agreed to try this. Encouraged patient in her practice of more positive behaviors as discussed in session including: Staying in the present and focusing on what she can control or change, getting outside some each day and walking or going to the gym as she is able, being more careful as she walks to prevent falling, stay in touch with people who are supportive, trying not to look ahead and imagine worst-case scenarios particularly with her Parkinson's, healthy nutrition and exercise, encouraging self talk, reduce overthinking and over analyzing, allow her  faith to be an emotional support to her, and realize the strength she shows working with goal-directed behaviors to move in a direction that supports her improved emotional health and outlook.  Goal review and progress/challenges noted with patient.  Next appointment within 4 to 5 weeks.  This record has been created using [employer].  Chart creation errors have been sought, but may not always have been located and corrected.  Such creation errors do not reflect on the standard of medical care provided.   Shanon Ace, LCSW

## 2022-05-31 DIAGNOSIS — R262 Difficulty in walking, not elsewhere classified: Secondary | ICD-10-CM | POA: Diagnosis not present

## 2022-05-31 DIAGNOSIS — M6281 Muscle weakness (generalized): Secondary | ICD-10-CM | POA: Diagnosis not present

## 2022-06-04 DIAGNOSIS — R531 Weakness: Secondary | ICD-10-CM | POA: Diagnosis not present

## 2022-06-04 DIAGNOSIS — G20A1 Parkinson's disease without dyskinesia, without mention of fluctuations: Secondary | ICD-10-CM | POA: Diagnosis not present

## 2022-06-04 DIAGNOSIS — F5101 Primary insomnia: Secondary | ICD-10-CM | POA: Diagnosis not present

## 2022-06-04 DIAGNOSIS — E039 Hypothyroidism, unspecified: Secondary | ICD-10-CM | POA: Diagnosis not present

## 2022-06-04 DIAGNOSIS — Z79899 Other long term (current) drug therapy: Secondary | ICD-10-CM | POA: Diagnosis not present

## 2022-06-04 DIAGNOSIS — R2681 Unsteadiness on feet: Secondary | ICD-10-CM | POA: Diagnosis not present

## 2022-06-04 DIAGNOSIS — F411 Generalized anxiety disorder: Secondary | ICD-10-CM | POA: Diagnosis not present

## 2022-06-04 DIAGNOSIS — Z6822 Body mass index (BMI) 22.0-22.9, adult: Secondary | ICD-10-CM | POA: Diagnosis not present

## 2022-06-07 DIAGNOSIS — M6281 Muscle weakness (generalized): Secondary | ICD-10-CM | POA: Diagnosis not present

## 2022-06-07 DIAGNOSIS — R262 Difficulty in walking, not elsewhere classified: Secondary | ICD-10-CM | POA: Diagnosis not present

## 2022-06-10 DIAGNOSIS — R002 Palpitations: Secondary | ICD-10-CM | POA: Diagnosis not present

## 2022-06-14 DIAGNOSIS — R262 Difficulty in walking, not elsewhere classified: Secondary | ICD-10-CM | POA: Diagnosis not present

## 2022-06-14 DIAGNOSIS — M6281 Muscle weakness (generalized): Secondary | ICD-10-CM | POA: Diagnosis not present

## 2022-06-15 ENCOUNTER — Ambulatory Visit: Payer: Medicare PPO | Admitting: Psychiatry

## 2022-06-15 DIAGNOSIS — R1310 Dysphagia, unspecified: Secondary | ICD-10-CM | POA: Diagnosis not present

## 2022-06-15 DIAGNOSIS — G20C Parkinsonism, unspecified: Secondary | ICD-10-CM | POA: Diagnosis not present

## 2022-06-16 ENCOUNTER — Encounter: Payer: Self-pay | Admitting: Adult Health

## 2022-06-16 ENCOUNTER — Telehealth (INDEPENDENT_AMBULATORY_CARE_PROVIDER_SITE_OTHER): Payer: Medicare PPO | Admitting: Adult Health

## 2022-06-16 DIAGNOSIS — F411 Generalized anxiety disorder: Secondary | ICD-10-CM | POA: Diagnosis not present

## 2022-06-16 NOTE — Progress Notes (Signed)
Morgan Reese [id number] [birth year] 72 y.o.  Virtual Visit via Video Note  I connected with pt @ on 06/16/22 at  3:00 PM EST by a video enabled telemedicine application and verified that I am speaking with the correct person using two identifiers.   I discussed the limitations of evaluation and management by telemedicine and the availability of in person appointments. The patient expressed understanding and agreed to proceed.  I discussed the assessment and treatment plan with the patient. The patient was provided an opportunity to ask questions and all were answered. The patient agreed with the plan and demonstrated an understanding of the instructions.   The patient was advised to call back or seek an in-person evaluation if the symptoms worsen or if the condition fails to improve as anticipated.  I provided 15 minutes of non-face-to-face time during this encounter.  The patient was located at home.  The provider was located at [facility].   Morgan Gibbs, NP   Subjective:   Patient ID:  Morgan Reese is a 72 y.o. (DOB 1951/01/02) female.  Chief Complaint: No chief complaint on file.   HPI ZARAHI FUERST presents for follow-up of anxiety disorder.  Describes mood today as "better". Pleasant. Denies tearfulness. Mood symptoms - reports some depression, anxiety, and irritability - "not like it used to be - more momentary". Stating "I'm able to talk myself down". Mood is consistent. Stating "I'm feeling better for sure". Reports stopping the Carba_dopa and starting Mirapex - feels it is more helpful. Improved interest and motivation. Taking medications as prescribed. Energy levels lower. Active, has a regular exercise routine - going to the gym. Enjoys some usual interests and activities. Married - lives with husband. Has 2 adult children. Spending time with family.  Appetite adequate. Weight gain - 127 to 130 pounds. Sleeps well most nights. Averages 7 to 8  hours. Denies daytime napping. Focus and concentration improved. Completing tasks. Managing aspects of household. Retired. Denies SI or HI.  Denies AH or VH. Denies paranoia.   Review of Systems:  Review of Systems  Musculoskeletal:  Negative for gait problem.  Neurological:  Negative for tremors.  Psychiatric/Behavioral:         Please refer to HPI    Medications: I have reviewed the patient's current medications.  Current Outpatient Medications  Medication Sig Dispense Refill   Ascorbic Acid (VITAMIN C) 1000 MG tablet Take 1,000 mg by mouth daily.     busPIRone (BUSPAR) 10 MG tablet Take 1 tablet (10 mg total) by mouth 3 (three) times daily. 90 tablet 2   Cholecalciferol (VITAMIN D3 PO) Take by mouth. Alternates 10,000 units with 5,000 units each day.     COPPER PO Take 30 mg by mouth 2 (two) times a week.     doxycycline (VIBRAMYCIN) 100 MG capsule Take 1 capsule (100 mg total) by mouth 2 (two) times daily. 19 capsule 0   estradiol (ESTRACE) 0.1 MG/GM vaginal cream Place 1 Applicatorful vaginally at bedtime.     estradiol (VIVELLE-DOT) 0.0375 MG/24HR Place 1 patch onto the skin 2 (two) times a week.     gabapentin (NEURONTIN) 300 MG capsule Take 1 capsule (300 mg total) by mouth 3 (three) times daily. 90 capsule 2   levothyroxine (SYNTHROID) 50 MCG tablet Take 50 mcg by mouth daily before breakfast.     Magnesium Citrate 200 MG TABS Take 600 mg by mouth daily.     melatonin 3 MG TABS tablet Take 6-9 mg  by mouth at bedtime.     NON FORMULARY Apply 2 % topically daily.     Omega-3 Fatty Acids (FISH OIL) 1000 MG CAPS Take 1 capsule by mouth at bedtime.     progesterone (PROMETRIUM) 200 MG capsule Take 200 mg by mouth daily.     pyridOXINE (B-6) 50 MG tablet Take 50 mg by mouth daily.     rasagiline (AZILECT) 1 MG TABS tablet Take 1 tablet (1 mg total) by mouth daily. 90 tablet 1   thiamine 100 MG tablet Take 100 mg by mouth daily.     VITAMIN K PO Take by mouth daily. MK7      zinc gluconate 50 MG tablet Take 50 mg by mouth daily.     No current facility-administered medications for this visit.    Medication Side Effects: None  Allergies:  Allergies  Allergen Reactions   Amoxicillin Swelling    REACTION: Head felt over-heated Other reaction(s): Other (See Comments) REACTION: Head felt over-heated    Serotonin Reuptake Inhibitors (Ssris) Other (See Comments)    Other reaction(s): hyponatremia SSRI's are contraindicated, causes hyponatremia     Past Medical History:  Diagnosis Date   Hypertension    Parkinson's disease    Sinus congestion    Thyroid disease     Family History  Problem Relation Age of Onset   Tremor Mother    Depression Mother    Cancer Father        Lung Cancer   Tremor Maternal Grandmother    Diabetes Paternal Grandmother    Stroke Paternal Uncle    Healthy Child    Parkinson's disease Neg Hx     Social History   Socioeconomic History   Marital status: Married    Spouse name: Not on file   Number of children: 2   Years of education: Not on file   Highest education level: Not on file  Occupational History   Occupation: retired    Comment: PhD; early childhood education  Tobacco Use   Smoking status: Never   Smokeless tobacco: Never  Vaping Use   Vaping Use: Never used  Substance and Sexual Activity   Alcohol use: Never   Drug use: Never   Sexual activity: Not on file  Other Topics Concern   Not on file  Social History Narrative   Right handed   Caffeine: none    Lives in a two story home with main bedroom on first floor.    Social Determinants of Health   Financial Resource Strain: Not on file  Food Insecurity: Not on file  Transportation Needs: Not on file  Physical Activity: Not on file  Stress: Not on file  Social Connections: Not on file  Intimate Partner Violence: Not on file    Past Medical History, Surgical history, Social history, and Family history were reviewed and updated as  appropriate.   Please see review of systems for further details on the patient's review from today.   Objective:   Physical Exam:  There were no vitals taken for this visit.  Physical Exam Constitutional:      General: She is not in acute distress. Musculoskeletal:        General: No deformity.  Neurological:     Mental Status: She is alert and oriented to person, place, and time.     Coordination: Coordination normal.  Psychiatric:        Attention and Perception: Attention and perception normal. She does not perceive auditory  or visual hallucinations.        Mood and Affect: Mood normal. Mood is not anxious or depressed. Affect is not labile, blunt, angry or inappropriate.        Speech: Speech normal.        Behavior: Behavior normal.        Thought Content: Thought content normal. Thought content is not paranoid or delusional. Thought content does not include homicidal or suicidal ideation. Thought content does not include homicidal or suicidal plan.        Cognition and Memory: Cognition and memory normal.        Judgment: Judgment normal.     Comments: Insight intact     Lab Review:     Component Value Date/Time   NA 135 04/21/2022 1636   K 3.6 04/21/2022 1636   CL 100 04/21/2022 1410   CO2 23 04/21/2022 1410   GLUCOSE 116 (H) 04/21/2022 1410   BUN 18 04/21/2022 1410   CREATININE 0.58 04/21/2022 1410   CALCIUM 8.8 (L) 04/21/2022 1410   PROT 6.2 (L) 04/21/2022 1410   ALBUMIN 4.0 04/21/2022 1410   AST 25 04/21/2022 1410   ALT 29 04/21/2022 1410   ALKPHOS 59 04/21/2022 1410   BILITOT 0.5 04/21/2022 1410   GFRNONAA >60 04/21/2022 1410       Component Value Date/Time   WBC 5.7 04/21/2022 1410   RBC 4.32 04/21/2022 1410   HGB 13.3 04/21/2022 1636   HCT 39.0 04/21/2022 1636   PLT 196 04/21/2022 1410   MCV 90.7 04/21/2022 1410   MCH 30.6 04/21/2022 1410   MCHC 33.7 04/21/2022 1410   RDW 12.6 04/21/2022 1410   LYMPHSABS 1.1 04/21/2022 1410   MONOABS 0.4  04/21/2022 1410   EOSABS 0.1 04/21/2022 1410   BASOSABS 0.1 04/21/2022 1410    No results found for: "POCLITH", "LITHIUM"   No results found for: "PHENYTOIN", "PHENOBARB", "VALPROATE", "CBMZ"   .res Assessment: Plan:    Plan:  Buspar 10mg  - three times daily for anxiety  Gabapentin 300mg  TID - anxiety  Stopped Carbidopa Levodopa Started Mirapex   PDMP reviewed  Upcoming appt with pain management - [facility]  Discussed potential benefits, risk, and side effects of benzodiazepines to include potential risk of tolerance and dependence, as well as possible drowsiness. Advised patient not to drive if experiencing drowsiness and to take lowest possible effective dose to minimize risk of dependence and tolerance.   Time spent with patient was 20 minutes. Greater than 50% of face to face time with patient was spent on counseling and coordination of care.    RTC 8 weeks   Diagnoses and all orders for this visit:  Generalized anxiety disorder     Please see After Visit Summary for patient specific instructions.  Future Appointments  Date Time Provider [place]  06/23/2022  1:00 PM Shanon Ace, LCSW [facility] None  10/19/2022  2:30 PM Tat, Eustace Quail, DO [facility] None    No orders of the defined types were placed in this encounter.     -------------------------------

## 2022-06-17 DIAGNOSIS — Z5181 Encounter for therapeutic drug level monitoring: Secondary | ICD-10-CM | POA: Diagnosis not present

## 2022-06-17 DIAGNOSIS — G5732 Lesion of lateral popliteal nerve, left lower limb: Secondary | ICD-10-CM | POA: Diagnosis not present

## 2022-06-17 DIAGNOSIS — M5417 Radiculopathy, lumbosacral region: Secondary | ICD-10-CM | POA: Diagnosis not present

## 2022-06-17 DIAGNOSIS — G5793 Unspecified mononeuropathy of bilateral lower limbs: Secondary | ICD-10-CM | POA: Diagnosis not present

## 2022-06-17 DIAGNOSIS — Z79899 Other long term (current) drug therapy: Secondary | ICD-10-CM | POA: Diagnosis not present

## 2022-06-21 DIAGNOSIS — R262 Difficulty in walking, not elsewhere classified: Secondary | ICD-10-CM | POA: Diagnosis not present

## 2022-06-21 DIAGNOSIS — M6281 Muscle weakness (generalized): Secondary | ICD-10-CM | POA: Diagnosis not present

## 2022-06-23 ENCOUNTER — Ambulatory Visit (INDEPENDENT_AMBULATORY_CARE_PROVIDER_SITE_OTHER): Payer: Medicare PPO | Admitting: Psychiatry

## 2022-06-23 DIAGNOSIS — F411 Generalized anxiety disorder: Secondary | ICD-10-CM

## 2022-06-23 NOTE — Progress Notes (Signed)
[facility] Counselor/Therapist Progress Note  Patient ID: Morgan Reese, MRN: [id number],    Date: 06/23/2022  Time Spent: 50 minutes  Treatment Type: Individual Therapy  Virtual Visit via Telehealth Note: MyChartVideo Connected with patient by a telemedicine/telehealth application, with their informed consent, and verified patient privacy and that I am speaking with the correct person using two identifiers. I discussed the limitations, risks, security and privacy concerns of performing psychotherapy and the availability of in person appointments. I also discussed with the patient that there may be a patient responsible charge related to this service. The patient expressed understanding and agreed to proceed. I discussed the treatment planning with the patient. The patient was provided an opportunity to ask questions and all were answered. The patient agreed with the plan and demonstrated an understanding of the instructions. The patient was advised to call  our office if  symptoms worsen or feel they are in a crisis state and need immediate contact.   Therapist Location: office Patient Location: home  Reported Symptoms: depression improved, anger and frustration still present "but not as much", less anxiety, "not on my carbadopa medicine now and am on new med to take the place of it."  Mental Status Exam:  Appearance:   Neat     Behavior:  Appropriate, Sharing, and Motivated  Motor:  Affected by her Parkinson's  Speech/Language:   Good but is affected some by her Parkinson's  Affect:  Depressed  Mood:  depressed, anxious  Thought process:  goal directed  Thought content:    Some rumination  Sensory/Perceptual disturbances:    WNL  Orientation:  oriented to person, place, time/date, situation, day of week, month of year, year, and stated date of Jan. 31, 2024  Attention:  Good  Concentration:  Good  Memory:  Some short term memory issues and Dr is aware  Fund of knowledge:    Good  Insight:    Good  Judgment:   Good  Impulse Control:  Good   Risk Assessment: Danger to Self:  No Self-injurious Behavior: No Danger to Others: No Duty to Warn:no Physical Aggression / Violence:No  Access to Firearms a concern: No  Gang Involvement:No   Subjective:  Patient reporting some decrease in her anxiety and depressive symptoms but is having some med issues with her Parkinson's and prescribed by her Dr following her for that condition and has let that Dr know patient is not taking that medication. Has had some frustrating side effects.States her thinking has been better, "I can have a straight thought and my mind is not racing." More calm and staying on track in therapy conversation. States she "is supposed to be evaluated for an implant in her leg that will stimulate a nerve in her leg that  might help her walk better." Went to gym today "and played" and that seems to be helping patient as well. Processed further today her journey with Parkinson's and shares a lot today about her journey thus far that she had not shared previously.  Shares some of her continued challenges with her health condition but still feeling better with a medication seeming to work better as noted above.  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 Not showing any agitation today. Shared some more of her paintings which bring her joy. "What I miss most is not being able to do complex tasks anymore." Hoping to have the leg implant soon."  Discussed holding onto  some of the gains that she has made more recently.  Patient is to call back to schedule her next appointment with therapist and her med provider, as she is needing to coordinate with another appt with her neurologist.  Working on more self understanding and self love and trying not to judge herself negatively.  Interventions: Cognitive Behavioral Therapy and Ego-Supportive  Long-term goal: Reduce overall level,  frequncy, and intensity of the anxiety so that daily functioning is not impaired.  Short-term goal: Increase understanding of beliefs and messages that produces the worry and anxiety.  Describe current and past experiences with specific fears, prominent worries, and anxiety symptoms including their impact on functioning and attempts to resolve it.  Strategies: Explore cognitive messages that mediate anxiety response and retrain in adaptive cognitions.  Diagnosis:   ICD-10-CM   1. Generalized anxiety disorder  F41.1      Plan: Patient seeming more encouraged today and showed good motivation and participation in session.  Very encouraged by one of her medications prescribed by her neurologist that is new and is not causing as many issues for her and feels that is definitely helping her "mind not to race".  Also feeling more calm since she started this medication.  Shared a couple more of her paintings which is then one of her favorite hobbies and actually has some good skills.  Appearance very neat today and mood seemed more upbeat.  Considering a procedure that involves a leg implant to help her walk better.  Stated towards the end of session that what she "misses most is not being able to do complex tasks anymore", and we processed this together some before session ended and still ended session on what seem to be a positive note with patient.  Did not speak nor acting any ways that were harsh towards herself today compared to last session.Encouraged patient and practicing more positive behaviors as noted in session including: To use journaling as a tool in between sessions and she agreed, to remain in the present and focused on what she can change her control, try getting outside some each day as she is able and walking or going to the gym, being more careful as she walks in order to prevent falling, staying in touch with people who are supportive, trying not to look ahead and imagine worst-case scenarios  particularly with her Parkinson's, healthy nutrition and exercise, encouraging self talk, reduce overthinking and over analyzing, allow her faith to be an emotional support to her, and recognize the strength she shows working with goal-directed behaviors to move in a direction that supports her improved emotional health and overall wellbeing.  Goal review and progress/challenges noted with patient.  Next appt within 3 to 4 weeks.  This record has been created using [employer].  Chart creation errors have been sought, but may not always have been located and corrected.  Such creation errors do not reflect on the standard of medical care provided.   Shanon Ace, LCSW

## 2022-06-23 NOTE — Addendum Note (Signed)
Addended byShanon Ace on: 06/23/2022 03:58 PM   Modules accepted: Level of Service

## 2022-06-28 DIAGNOSIS — M6281 Muscle weakness (generalized): Secondary | ICD-10-CM | POA: Diagnosis not present

## 2022-06-28 DIAGNOSIS — R262 Difficulty in walking, not elsewhere classified: Secondary | ICD-10-CM | POA: Diagnosis not present

## 2022-06-29 DIAGNOSIS — G894 Chronic pain syndrome: Secondary | ICD-10-CM | POA: Diagnosis not present

## 2022-07-01 DIAGNOSIS — G894 Chronic pain syndrome: Secondary | ICD-10-CM | POA: Diagnosis not present

## 2022-07-05 DIAGNOSIS — R262 Difficulty in walking, not elsewhere classified: Secondary | ICD-10-CM | POA: Diagnosis not present

## 2022-07-05 DIAGNOSIS — M6281 Muscle weakness (generalized): Secondary | ICD-10-CM | POA: Diagnosis not present

## 2022-07-12 DIAGNOSIS — M6281 Muscle weakness (generalized): Secondary | ICD-10-CM | POA: Diagnosis not present

## 2022-07-12 DIAGNOSIS — R262 Difficulty in walking, not elsewhere classified: Secondary | ICD-10-CM | POA: Diagnosis not present

## 2022-07-14 ENCOUNTER — Ambulatory Visit: Payer: Medicare PPO | Admitting: Psychiatry

## 2022-07-16 DIAGNOSIS — G20B1 Parkinson's disease with dyskinesia, without mention of fluctuations: Secondary | ICD-10-CM | POA: Diagnosis not present

## 2022-07-16 DIAGNOSIS — J0100 Acute maxillary sinusitis, unspecified: Secondary | ICD-10-CM | POA: Diagnosis not present

## 2022-07-22 DIAGNOSIS — L820 Inflamed seborrheic keratosis: Secondary | ICD-10-CM | POA: Diagnosis not present

## 2022-07-22 DIAGNOSIS — D1801 Hemangioma of skin and subcutaneous tissue: Secondary | ICD-10-CM | POA: Diagnosis not present

## 2022-07-22 DIAGNOSIS — L814 Other melanin hyperpigmentation: Secondary | ICD-10-CM | POA: Diagnosis not present

## 2022-07-22 DIAGNOSIS — C44519 Basal cell carcinoma of skin of other part of trunk: Secondary | ICD-10-CM | POA: Diagnosis not present

## 2022-07-22 DIAGNOSIS — L570 Actinic keratosis: Secondary | ICD-10-CM | POA: Diagnosis not present

## 2022-07-22 DIAGNOSIS — D485 Neoplasm of uncertain behavior of skin: Secondary | ICD-10-CM | POA: Diagnosis not present

## 2022-07-26 DIAGNOSIS — R262 Difficulty in walking, not elsewhere classified: Secondary | ICD-10-CM | POA: Diagnosis not present

## 2022-07-26 DIAGNOSIS — M6281 Muscle weakness (generalized): Secondary | ICD-10-CM | POA: Diagnosis not present

## 2022-07-28 DIAGNOSIS — Z6822 Body mass index (BMI) 22.0-22.9, adult: Secondary | ICD-10-CM | POA: Diagnosis not present

## 2022-07-28 DIAGNOSIS — J209 Acute bronchitis, unspecified: Secondary | ICD-10-CM | POA: Diagnosis not present

## 2022-07-29 ENCOUNTER — Ambulatory Visit (INDEPENDENT_AMBULATORY_CARE_PROVIDER_SITE_OTHER): Payer: Medicare PPO | Admitting: Psychiatry

## 2022-07-29 DIAGNOSIS — F411 Generalized anxiety disorder: Secondary | ICD-10-CM

## 2022-07-29 NOTE — Progress Notes (Signed)
[facility] Counselor/Therapist Progress Note  Patient ID: Morgan Reese, MRN: [id number],    Date: 07/29/2022  Time Spent: 45 minutes   Treatment Type: Individual Therapy  Virtual Visit via Telehealth Note: MyChart Video Connected with patient by a telemedicine/telehealth application, with their informed consent, and verified patient privacy and that I am speaking with the correct person using two identifiers. I discussed the limitations, risks, security and privacy concerns of performing psychotherapy and the availability of in person appointments. I also discussed with the patient that there may be a patient responsible charge related to this service. The patient expressed understanding and agreed to proceed. I discussed the treatment planning with the patient. The patient was provided an opportunity to ask questions and all were answered. The patient agreed with the plan and demonstrated an understanding of the instructions. The patient was advised to call  our office if  symptoms worsen or feel they are in a crisis state and need immediate contact.   Therapist Location: office Patient Location: home   Reported Symptoms: anxiety "is stronger symptom", fears at times but "know what to not focus on so not to be afraid", pleased that her newer medication is helping her   Mental Status Exam:  Appearance:   Casual and Neat     Behavior:  Appropriate, Sharing, and Motivated  Motor:  Affected by her Parkinson's  Speech/Language:   Clear and Coherent  Affect:  Anxious but also pleasant  Mood:  anxious  Thought process:  goal directed  Thought content:    WNL  Sensory/Perceptual disturbances:    WNL  Orientation:  oriented to person, place, time/date, situation, day of week, month of year, year, and stated date of July 29, 2022  Attention:  Good  Concentration:  Good  Memory:  Some short term memory issues reported and Dr is aware  Fund of knowledge:   Good  Insight:    Good and  Fair  Judgment:   Good  Impulse Control:  Good   Risk Assessment: Danger to Self:  No Self-injurious Behavior: No Danger to Others: No Duty to Warn:no Physical Aggression / Violence:No  Access to Firearms a concern: No  Gang Involvement:No   Subjective:    Patient showing good participation and motivation in session today as she needed to request a video session due to health issues and "being quite sick with bronchitis more recently".  She did not want to miss her appointment however so arranged for it to be done by video. Reporting today that her newer medication is continuing to help her "feel more normal, some interest in "sexuality" again as I had lost that".  Reports that she can "laugh more, cleaning some items I didn't use to be able to do, I'm more in touch with what I want to say, can talk more freely". Still tried to force her speech and adds that she is going to be seeing a speech therapist. Reports that she does sleep ok but wakes up earlier to "take my medicine for the thyroid". Has had bronchitis but still wanted to have her appt today to be able to share more of her progress with therapist. States she feel more calm a lot of the time. "Definitely my anxiety and depression symptoms have decreased some." Wants to continue working with her anxiety and worrying to help it decrease further. Reports she can do this by focusing more on the positives especially with her newer medication, intentionally trying to stay  more calm and focused in conversations as we noted in session today, walk/exercise a little more as she is able and with Dr's "ok", intentionally making efforts to stay on track in conversations, and remind herself that even when she may feel anxious at times "It's not a permanent feeling." Had stated previously this is helpful for her to remember. Showing some improvement today as she did not make any self-negating comments at all.  Will see again within approx a month, as she has  several other med appts in between now and that time.   Interventions: Cognitive Behavioral Therapy and Ego-Supportive  Long-term goal: Reduce overall level, frequncy, and intensity of the anxiety so that daily functioning is not impaired.  Short-term goal: Increase understanding of beliefs and messages that produces the worry and anxiety.  Describe current and past experiences with specific fears, prominent worries, and anxiety symptoms including their impact on functioning and attempts to resolve it.  Strategies: Explore cognitive messages that mediate anxiety response and retrain in adaptive cognitions.  Diagnosis:   ICD-10-CM   1. Generalized anxiety disorder  F41.1      Plan:  Patient today showing good motivation and participation in session today as she was glad to be able to share with therapist about how her new her medication is still helping her more and the progress she is noticing as noted above.  This seems to have given her a little more energy and definitely more hope which was good to see.  She cited several examples of improvement as noted above also.  Is seeing some improvement in her anxiety and worrying and is committed to keep working on it.  Feeling calmer more often.  Less self negating.  Feeling more hope and wants to build on the progress she is making.  Is pursuing a possible procedure that will help her walk better. Needs to continue with goal-directed behaviors in order to make more progress in a forward direction. Encouraged patient in her practice of more positive and self affirming behaviors as noted in session including: To use her journaling as a tool in between sessions, to stay in the present focused on what she can change or control, try getting outside some each day as she is able and walking or going to the gym, being more careful as she walks in order to prevent falling, staying in touch with people who are supportive, trying not to look ahead and imagine  worst-case scenarios particularly with her poor concerns disease, healthy nutrition and exercise, encouraging self talk, reduce overthinking and over analyzing, allow her faith to be an emotional support to her, and realize the strength that she shows working with goal-directed behaviors to move in a direction that supports her improved emotional health and overall wellbeing.  Goal review and progress/challenges noted with patient.  Next appointment within 3 to 4 weeks.  This record has been created using [employer].  Chart creation errors have been sought, but may not always have been located and corrected.  Such creation errors do not reflect on the standard of medical care provided.   Shanon Ace, LCSW

## 2022-08-03 ENCOUNTER — Ambulatory Visit: Payer: Medicare PPO | Attending: Family Medicine

## 2022-08-03 DIAGNOSIS — R490 Dysphonia: Secondary | ICD-10-CM | POA: Insufficient documentation

## 2022-08-03 DIAGNOSIS — R1310 Dysphagia, unspecified: Secondary | ICD-10-CM | POA: Diagnosis not present

## 2022-08-03 NOTE — Patient Instructions (Signed)
      PhoRTE VOICE EXERCISES - DO TWICE EACH DAY  1) Hold "AHHHHHHHHHHHHH" 10 times   As long as you can as strong as you can - push from your abdominal muscles!   [email or web address]  2) Count 1-10 (skip 7)   Start at as low pitch as you can, glide to as high pitch as you can, then back down as low as you can   [email or web address]  3) Say 10 sentences -two different ways   (a) like you are calling over the fence to your neighbor in a higher-pitch voice  (b) like you are scolding your dog in a LOW authoritative voice   [email or web address]

## 2022-08-04 ENCOUNTER — Other Ambulatory Visit: Payer: Self-pay

## 2022-08-04 NOTE — Therapy (Signed)
OUTPATIENT SPEECH LANGUAGE PATHOLOGY VOICE EVALUATION   Patient Name: Morgan Reese MRN: [id number] DOB:08-01-1950, 72 y.o., female Today's Date: 08/04/2022  PCP: Faustino Congress, NP REFERRING PROVIDER: Angela Adam, DO  END OF SESSION:  End of Session - 08/04/22 0732     Visit Number 1    Number of Visits 17    Date for SLP Re-Evaluation 10/13/22    SLP Start Time 28    SLP Stop Time  [id number]    SLP Time Calculation (min) 40 min    Activity Tolerance Patient tolerated treatment well             Past Medical History:  Diagnosis Date   Hypertension    Parkinson's disease    Sinus congestion    Thyroid disease    Past Surgical History:  Procedure Laterality Date   CATARACT EXTRACTION, BILATERAL     NASAL SINUS SURGERY     TUBAL LIGATION     Patient Active Problem List   Diagnosis Date Noted   Parkinson's disease without dyskinesia or fluctuating manifestations 04/12/2022   Leg pain 03/30/2022   Disorder of female genital organs 06/26/2021   Midline cystocele 06/26/2021   Generalized anxiety disorder 06/26/2021   Hyponatremia 03/10/2021   Preventive measure 10/07/2014   Hypothyroidism 10/07/2014    Onset date: 2 years ago  REFERRING DIAG:  G20.B1 (ICD-10-CM) - Parkinson's disease with dyskinesia, without mention of fluctuations    THERAPY DIAG:  Hoarseness  Dysphagia, unspecified type  Rationale for Evaluation and Treatment: Rehabilitation  SUBJECTIVE:   SUBJECTIVE STATEMENT: "It started with hoarseness 2 years ago but it got progressively worse." Pt accompanied by: self  PERTINENT HISTORY:  Dr. Wilford Corner. Laurance Flatten, ENT ([facility] [place]) 03/01/22 Associated Problem(s): Hoarseness 1 year. Started after COVID. Much improved now than it was when it first started. Non-smoker. No heartburn. EXAM shows moderately breathy voice. No stridor. Oral cavity was unremarkable. Indirect laryngoscopy showed strong gag reflex. Neck without adenopathy mass  or thyromegaly. FLEXIBLE LARYNGOSCOPY shows normal nasal and nasopharyngeal mucosa hypopharynx is normal. Vocal cord shows no concerning lesions. Very sluggish bilateral movement with bowing of the vocal cords. PLAN: Reassured no concerning lesions. She has pretty significant bowing. As she is trending improving, continue observation for improvement is reasonable. She will reach out if symptoms remain bothersome. Otherwise follow-up as needed. Electronically signed by Hassell Done  Dr. Gayla Medicus, neurologist ([place]) 06/15/22 Morgan Reese returns today for work in return follow-up of her parkinsonism. She was last seen on 04/30/2022. This appointment was scheduled after she sent several MyChart messages expressing concern regarding side effects associated with Sinemet including severe anxiety. Subsequently, we discontinued Sinemet and she is now on Mirapex 1/2 three times daily. Morgan Reese reports that with Sinemet therapy she felt terrible but she recognized that her voice symptoms improved and her tremor improved. To date, she has tolerated Mirapex well except that it is making her very sleepy. We discussed slow continued titration to reach a target dose of 1 3 times daily. I recommended she not drive or engage in any potentially dangerous activities if she is sleeping. Initial antibody test is negative for myasthenia gravis. In the office today we have reviewed her cranial MRI performed on 05/13/2022. She has mild accelerated deep white matter changes and ventriculomegaly with mild central atrophy. Past Medical History, Past Surgery History, Allergies, Social History, and Family History were reviewed and updated as appropriate.   PAIN:  Are you having pain?  No  FALLS: Has patient fallen in last 6 months? No, N  LIVING ENVIRONMENT: Lives with: lives with their spouse Lives in: House/apartment  PLOF: Independent  PATIENT GOALS: "I want my voice back!"  OBJECTIVE:    DIAGNOSTIC FINDINGS:  See "Pertienent History"  COGNITION: Overall cognitive status: Within functional limits for tasks assessed  SOCIAL HISTORY: Occupation: Retired Scientist, research (physical sciences) intake: optimal Caffeine/alcohol intake: minimal Daily voice use:  WNL  PERCEPTUAL VOICE ASSESSMENT: Voice quality: breathy, rough, diplophonia, and aphonic Vocal abuse: habitual throat clearing Resonance: normal Respiratory function: speaking on residual capacity  OBJECTIVE VOICE ASSESSMENT: Maximum phonation time for sustained "ah": <2.0 seconds Conversational pitch average: 175 Hz Conversational pitch range: 63-349 Hz Conversational loudness average: 60-66 dB Conversational loudness range: low 60s dB  SWALLOWING Pt reports swallowing s/sx that warrant bedside (clinical) swallow assessment to ID need for further evaluation with MBSS or FEES. Occasional coughing with liquids was reported. No overt s/sx aspiration PNA presently, and pt's voice is most concerning to her at this time.  PATIENT REPORTED OUTCOME MEASURES (PROM): V-RQOL: raw score=29, VR-QOL score=52.5 ("poor-fair"),  and VHI: 75 ("Severe" scores are 60-100  TODAY'S TREATMENT:                                                                                                                                         DATE:  Phonatory Resistance Training Exercises (PhoRTE)   (Eval) 08/03/22: SLP assisted pt through teaching her Phonatory Resistance Training Exercises (PhoRTE) exercises - she req'd mod A to make numbers half the length of the SLP on the training video on youTube (see pt instructions). PT PERFORMED ALL PhoRTE EXERCISES WITH PALM PUSH TO FOSTER VOCAL FOLD CLOSURE. She trialed palm push, pulling up on chair seat, and pushing on chair arms and she and SLP agreed /a/ appeared longer (2-3 seconds in length) with palm push.  PATIENT EDUCATION: Education details: See"today's treatment" Person educated: Patient Education method:  Explanation, Demonstration, Verbal cues, and Handouts Education comprehension: verbalized understanding, returned demonstration, verbal cues required, and needs further education  HOME EXERCISE PROGRAM: PhoRTE  GOALS: Goals reviewed with patient? No  SHORT TERM GOALS: Target date: 09/08/22  Pt will complete PhoRTE exercises with rare min A in 2 sessions Baseline: Goal status: INITIAL  2.  Pt will undergo clinical swallow assessment in first 2 sessions and objective swallow eval may be performed if clinically indicated; ST and LT goals added PRN Baseline:  Goal status: INITIAL   LONG TERM GOALS: Target date: 10/13/22  Pt will complete PhoRTE exercises with modified independence in 3 sessions Baseline:  Goal status: INITIAL  2.  Pt sustain /a/ measure will incr to average 4 seconds in 2 sessions without compensatory measures. Baseline:  Goal status: INITIAL  3.  Pt's VHI and VR-QOL will improve compared to scores today, when administered in the last two ST sessions Baseline:  Goal status: INITIAL  ASSESSMENT:  CLINICAL IMPRESSION: Patient is a 72 y.o. F who was seen today for decr'd voice quality after ENT evaluation in October 2023 identifying bowed vocal folds. Pt was dx'd with Parkinsonism in May 2023. A bedside swallow assessment (cliical swallow  assessment) will take place in first two sessions and pt will be referred for MBS or FEES if/as clinically indicated.   OBJECTIVE IMPAIRMENTS: include voice disorder and dysphagia. These impairments are limiting patient from managing appointments, household responsibilities, ADLs/IADLs, effectively communicating at home and in community, and safety when swallowing. Factors affecting potential to achieve goals and functional outcome are co-morbidities and severity of impairments.. Patient will benefit from skilled SLP services to address above impairments and improve overall function.  REHAB POTENTIAL: Fair given  severity  PLAN:  SLP FREQUENCY: 1-2x/week  SLP DURATION: 8 weeks  PLANNED INTERVENTIONS: Aspiration precaution training, Pharyngeal strengthening exercises, Diet toleration management , Environmental controls, Trials of upgraded texture/liquids, Internal/external aids, Oral motor exercises, Multimodal communication approach, SLP instruction and feedback, Compensatory strategies, Patient/family education, and PhoRTE exercises    [place], [place] 08/04/2022, 7:42 AM

## 2022-08-10 ENCOUNTER — Telehealth: Payer: Self-pay

## 2022-08-10 NOTE — Telephone Encounter (Signed)
SLP spoke with pt after pt called to inform SLP she was losing her voice as she progressed through the PhoRTE exercises.  At this time SLP unsure if pt's issue is more with vocal folds fatiguing, or a breath support issue. SLP to assess at next visit/next 2 visits to clarify pt's difficulty with PhoRTE. SLP told pt to complete just the pitch variation exercise at this time, until next visit Thursday with Leta Speller, SLP.

## 2022-08-12 ENCOUNTER — Ambulatory Visit: Payer: Medicare PPO

## 2022-08-12 DIAGNOSIS — R490 Dysphonia: Secondary | ICD-10-CM

## 2022-08-12 DIAGNOSIS — R1310 Dysphagia, unspecified: Secondary | ICD-10-CM | POA: Diagnosis not present

## 2022-08-12 NOTE — Therapy (Signed)
OUTPATIENT SPEECH LANGUAGE PATHOLOGY VOICE TREATMENT   Patient Name: Morgan Reese MRN: [id number] DOB:10-12-1950, 72 y.o., female Today's Date: 08/12/2022  PCP: Faustino Congress, NP REFERRING PROVIDER: Angela Adam, DO  END OF SESSION:  End of Session - 08/12/22 1243     Visit Number 2    Number of Visits 17    Date for SLP Re-Evaluation 10/13/22    SLP Start Time 1149    SLP Stop Time  1232    SLP Time Calculation (min) 43 min    Activity Tolerance Patient tolerated treatment well              Past Medical History:  Diagnosis Date   Hypertension    Parkinson's disease    Sinus congestion    Thyroid disease    Past Surgical History:  Procedure Laterality Date   CATARACT EXTRACTION, BILATERAL     NASAL SINUS SURGERY     TUBAL LIGATION     Patient Active Problem List   Diagnosis Date Noted   Parkinson's disease without dyskinesia or fluctuating manifestations 04/12/2022   Leg pain 03/30/2022   Disorder of female genital organs 06/26/2021   Midline cystocele 06/26/2021   Generalized anxiety disorder 06/26/2021   Hyponatremia 03/10/2021   Preventive measure 10/07/2014   Hypothyroidism 10/07/2014    Onset date: 2 years ago  REFERRING DIAG:  G20.B1 (ICD-10-CM) - Parkinson's disease with dyskinesia, without mention of fluctuations    THERAPY DIAG:  Hoarseness  Dysphagia, unspecified type  Rationale for Evaluation and Treatment: Rehabilitation  SUBJECTIVE:   SUBJECTIVE STATEMENT: "I'm squeaking like a mouse"  Pt accompanied by: self  PERTINENT HISTORY:  Dr. Wilford Corner. Laurance Flatten, ENT ([facility] [place] [facility]) 03/01/22 Associated Problem(s): Hoarseness 1 year. Started after COVID. Much improved now than it was when it first started. Non-smoker. No heartburn. EXAM shows moderately breathy voice. No stridor. Oral cavity was unremarkable. Indirect laryngoscopy showed strong gag reflex. Neck without adenopathy mass or thyromegaly. FLEXIBLE LARYNGOSCOPY  shows normal nasal and nasopharyngeal mucosa hypopharynx is normal. Vocal cord shows no concerning lesions. Very sluggish bilateral movement with bowing of the vocal cords. PLAN: Reassured no concerning lesions. She has pretty significant bowing. As she is trending improving, continue observation for improvement is reasonable. She will reach out if symptoms remain bothersome. Otherwise follow-up as needed. Electronically signed by Hassell Done  Dr. Gayla Medicus, neurologist ([place]) 06/15/22 Morgan Reese returns today for work in return follow-up of her parkinsonism. She was last seen on 04/30/2022. This appointment was scheduled after she sent several MyChart messages expressing concern regarding side effects associated with Sinemet including severe anxiety. Subsequently, we discontinued Sinemet and she is now on Mirapex 1/2 three times daily. Morgan Reese reports that with Sinemet therapy she felt terrible but she recognized that her voice symptoms improved and her tremor improved. To date, she has tolerated Mirapex well except that it is making her very sleepy. We discussed slow continued titration to reach a target dose of 1 3 times daily. I recommended she not drive or engage in any potentially dangerous activities if she is sleeping. Initial antibody test is negative for myasthenia gravis. In the office today we have reviewed her cranial MRI performed on 05/13/2022. She has mild accelerated deep white matter changes and ventriculomegaly with mild central atrophy. Past Medical History, Past Surgery History, Allergies, Social History, and Family History were reviewed and updated as appropriate.   PAIN: Are you having pain? No  FALLS: Has patient fallen  in last 6 months? No, N  LIVING ENVIRONMENT: Lives with: lives with their spouse Lives in: House/apartment  PLOF: Independent  PATIENT GOALS: "I want my voice back!"  OBJECTIVE:   DIAGNOSTIC FINDINGS:  See "Pertienent  History"  COGNITION: Overall cognitive status: Within functional limits for tasks assessed  SOCIAL HISTORY: Occupation: Retired Scientist, research (physical sciences) intake: optimal Caffeine/alcohol intake: minimal Daily voice use:  WNL  PERCEPTUAL VOICE ASSESSMENT: Voice quality: breathy, rough, diplophonia, and aphonic Vocal abuse: habitual throat clearing Resonance: normal Respiratory function: speaking on residual capacity  SWALLOWING Pt reports swallowing s/sx that warrant bedside (clinical) swallow assessment to ID need for further evaluation with MBSS or FEES. Occasional coughing with liquids was reported. No overt s/sx aspiration PNA presently, and pt's voice is most concerning to her at this time.  PATIENT REPORTED OUTCOME MEASURES (PROM): V-RQOL: raw score=29, VR-QOL score=52.5 ("poor-fair"),  and VHI: 75 ("Severe" scores are 60-100  TODAY'S TREATMENT:                                                                                                                                          08/12/22: Entered with strained vocal quality with variations in pitch and loudness. Overt impaired breath support observed with usual noisy breathing, segmented sentences, and occasional aphonia. Reported recent allergies impacting her today, resulting in increased shakiness and feeling panicky. SLP instructed abdominal breathing with focus on upper body relaxation, in which pt reported significant benefit. Reported she is aware she frequently holds her breath. Encouraged increased awareness of breath holding and focusing on continuous breath (I.e. "wave on a shore"). Introduced Semi-Occluded Vocal Tract Exercises (SOVTE) today to target breath support and reduce tension. Pt able to demo straw exercises (breathing and humming into/out of water) with intermittent mod cues. Duration of exhalations limited to 3-5 seconds. Notable difficulty humming into water d/t increased coordination and resistance. Updated HEP to focus on  breath support and SOVTE. Of note, pt planning to f/u with MD tomorrow re: bronchitis, allergies, and pulmonology referral.   (Eval) 08/03/22: SLP assisted pt through teaching her Phonatory Resistance Training Exercises (PhoRTE) exercises - she req'd mod A to make numbers half the length of the SLP on the training video on youTube (see pt instructions). PT PERFORMED ALL PhoRTE EXERCISES WITH PALM PUSH TO FOSTER VOCAL FOLD CLOSURE. She trialed palm push, pulling up on chair seat, and pushing on chair arms and she and SLP agreed /a/ appeared longer (2-3 seconds in length) with palm push.  PATIENT EDUCATION: Education details: See"today's treatment" Person educated: Patient Education method: Explanation, Demonstration, Verbal cues, and Handouts Education comprehension: verbalized understanding, returned demonstration, verbal cues required, and needs further education  HOME EXERCISE PROGRAM: PhoRTE  GOALS: Goals reviewed with patient? No  SHORT TERM GOALS: Target date: 09/08/22  Pt will complete PhoRTE exercises with rare min A in 2 sessions Baseline: Goal status: IN PROGRESS  2.  Pt will undergo clinical swallow assessment in first 2 sessions and objective swallow eval may be performed if clinically indicated; ST and LT goals added PRN Baseline:  Goal status: IN PROGRESS   LONG TERM GOALS: Target date: 10/13/22  Pt will complete PhoRTE exercises with modified independence in 3 sessions Baseline:  Goal status: IN PROGRESS  2.  Pt sustain /a/ measure will incr to average 4 seconds in 2 sessions without compensatory measures. Baseline:  Goal status: IN PROGRESS  3.  Pt's VHI and VR-QOL will improve compared to scores today, when administered in the last two ST sessions Baseline:  Goal status: IN PROGRESS   ASSESSMENT:  CLINICAL IMPRESSION: Patient is a 72 y.o. F who was seen today for decr'd voice quality after ENT evaluation in October 2023 identifying bowed vocal folds. Pt was  dx'd with Parkinsonism in May 2023. A bedside swallow assessment (clinical swallow assessment) will take place in first two sessions and pt will be referred for MBS or FEES if/as clinically indicated. Initiated education and instruction of abdominal breathing, SOVTE, and PHOrTE to optimize breath support and vocal quality.   OBJECTIVE IMPAIRMENTS: include voice disorder and dysphagia. These impairments are limiting patient from managing appointments, household responsibilities, ADLs/IADLs, effectively communicating at home and in community, and safety when swallowing. Factors affecting potential to achieve goals and functional outcome are co-morbidities and severity of impairments.. Patient will benefit from skilled SLP services to address above impairments and improve overall function.  REHAB POTENTIAL: Fair given severity  PLAN:  SLP FREQUENCY: 1-2x/week  SLP DURATION: 8 weeks  PLANNED INTERVENTIONS: Aspiration precaution training, Pharyngeal strengthening exercises, Diet toleration management , Environmental controls, Trials of upgraded texture/liquids, Internal/external aids, Oral motor exercises, Multimodal communication approach, SLP instruction and feedback, Compensatory strategies, Patient/family education, and PhoRTE exercises    Marzetta Board, [place] 08/12/2022, 12:43 PM

## 2022-08-12 NOTE — Patient Instructions (Signed)
  Semi-occluded vocal tract exercises (SOVTE)  These allow your vocal folds to vibrate without excess tension and promotes high placement of the voice  Use SOVTE as a warm up before prolonged speaking and vocal exercises  High resistance: voicing through a stirring straw  Medium resistance: voicing through a drinking straw  Watch Vocal Straw Exercises with Lolita Cram on YouTube: [email or web address]  Exercises: Take a deep breath and blow through the straw (x3-5) Take a deep breath and hum through the straw (x3-5)  A goal would be 2-3 minutes several times a day   As always, use good belly breathing while completing SOVTE  If you feel anxious or shaky, I want you to stop and breath (in through the nose, out through the mouth) at least 5x. Think about relaxing your chest, shoulders, and neck. Think about sinking down into the chair.

## 2022-08-13 ENCOUNTER — Ambulatory Visit
Admission: RE | Admit: 2022-08-13 | Discharge: 2022-08-13 | Disposition: A | Discharge status: Home / Self Care | Payer: Medicare PPO | Source: Ambulatory Visit | Attending: Family Medicine | Admitting: Family Medicine

## 2022-08-13 ENCOUNTER — Other Ambulatory Visit: Payer: Self-pay | Admitting: Family Medicine

## 2022-08-13 DIAGNOSIS — Z6823 Body mass index (BMI) 23.0-23.9, adult: Secondary | ICD-10-CM | POA: Diagnosis not present

## 2022-08-13 DIAGNOSIS — R062 Wheezing: Secondary | ICD-10-CM

## 2022-08-13 DIAGNOSIS — R490 Dysphonia: Secondary | ICD-10-CM | POA: Diagnosis not present

## 2022-08-13 DIAGNOSIS — I700 Atherosclerosis of aorta: Secondary | ICD-10-CM | POA: Diagnosis not present

## 2022-08-16 ENCOUNTER — Ambulatory Visit: Payer: Medicare PPO

## 2022-08-16 DIAGNOSIS — R490 Dysphonia: Secondary | ICD-10-CM | POA: Diagnosis not present

## 2022-08-16 DIAGNOSIS — R1310 Dysphagia, unspecified: Secondary | ICD-10-CM | POA: Diagnosis not present

## 2022-08-16 NOTE — Patient Instructions (Addendum)
    PRACTICE BELLY BREATHING (aka DIAPHRAGMATIC BREATHING, ABDOMINAL BREATHING) FOR 15 MINUTES 2X A DAY  WHEN YOU TALK IMMEDIATELY AFTERWARDS CONSIDER: IS MY VOICE BETTER, WORSE, ABOUT THE SAME?  PRACTICE BLOWING WITH A STRAW IN WATER - 10X, 3X A DAY   3/25 3/26 3/27 3/28 3/29 3/30 3/31 4/1

## 2022-08-16 NOTE — Therapy (Signed)
OUTPATIENT SPEECH LANGUAGE PATHOLOGY VOICE TREATMENT   Patient Name: Morgan Reese MRN: [id number] DOB:[birth year], 72 y.o., female Today's Date: 08/16/2022  PCP: Faustino Congress, NP REFERRING PROVIDER: Angela Adam, DO  END OF SESSION:  End of Session - 08/16/22 1555     Visit Number 3    Number of Visits 17    Date for SLP Re-Evaluation 10/13/22    SLP Start Time 1320    SLP Stop Time  1400    SLP Time Calculation (min) 40 min    Activity Tolerance Patient tolerated treatment well               Past Medical History:  Diagnosis Date   Hypertension    Parkinson's disease    Sinus congestion    Thyroid disease    Past Surgical History:  Procedure Laterality Date   CATARACT EXTRACTION, BILATERAL     NASAL SINUS SURGERY     TUBAL LIGATION     Patient Active Problem List   Diagnosis Date Noted   Parkinson's disease without dyskinesia or fluctuating manifestations 04/12/2022   Leg pain 03/30/2022   Disorder of female genital organs 06/26/2021   Midline cystocele 06/26/2021   Generalized anxiety disorder 06/26/2021   Hyponatremia 03/10/2021   Preventive measure 10/07/2014   Hypothyroidism 10/07/2014    Onset date: 2 years ago  REFERRING DIAG:  G20.B1 (ICD-10-CM) - Parkinson's disease with dyskinesia, without mention of fluctuations    THERAPY DIAG:  Hoarseness  Dysphagia, unspecified type  Rationale for Evaluation and Treatment: Rehabilitation  SUBJECTIVE:   SUBJECTIVE STATEMENT: Pt arrives today wheezing after her walk back to ST room, subsided after 3 minutes in ST room.  Pt accompanied by: self  PERTINENT HISTORY:  Dr. Wilford Corner. Laurance Flatten, ENT ([facility] [place]) 03/01/22 Associated Problem(s): Hoarseness 1 year. Started after COVID. Much improved now than it was when it first started. Non-smoker. No heartburn. EXAM shows moderately breathy voice. No stridor. Oral cavity was unremarkable. Indirect laryngoscopy showed strong gag reflex.  Neck without adenopathy mass or thyromegaly. FLEXIBLE LARYNGOSCOPY shows normal nasal and nasopharyngeal mucosa hypopharynx is normal. Vocal cord shows no concerning lesions. Very sluggish bilateral movement with bowing of the vocal cords. PLAN: Reassured no concerning lesions. She has pretty significant bowing. As she is trending improving, continue observation for improvement is reasonable. She will reach out if symptoms remain bothersome. Otherwise follow-up as needed. Electronically signed by Hassell Done  Dr. Gayla Medicus, neurologist ([place]) 06/15/22 [name] returns today for work in return follow-up of her parkinsonism. She was last seen on 04/30/2022. This appointment was scheduled after she sent several MyChart messages expressing concern regarding side effects associated with Sinemet including severe anxiety. Subsequently, we discontinued Sinemet and she is now on Mirapex 1/2 three times daily. Mrs. Boesen reports that with Sinemet therapy she felt terrible but she recognized that her voice symptoms improved and her tremor improved. To date, she has tolerated Mirapex well except that it is making her very sleepy. We discussed slow continued titration to reach a target dose of 1 3 times daily. I recommended she not drive or engage in any potentially dangerous activities if she is sleeping. Initial antibody test is negative for myasthenia gravis. In the office today we have reviewed her cranial MRI performed on 05/13/2022. She has mild accelerated deep white matter changes and ventriculomegaly with mild central atrophy. Past Medical History, Past Surgery History, Allergies, Social History, and Family History were reviewed and updated as  appropriate.   PAIN: Are you having pain? No   PATIENT GOALS: "I want my voice back!"  OBJECTIVE:    SWALLOWING Pt reports swallowing s/sx that warrant bedside (clinical) swallow assessment to ID need for further evaluation with MBSS or  FEES. Occasional coughing with liquids was reported. No overt s/sx aspiration PNA presently, and pt's voice is most concerning to her at this time.  PATIENT REPORTED OUTCOME MEASURES (PROM): V-RQOL: raw score=29, VR-QOL score=52.5 ("poor-fair"),  and VHI: 75 ("Severe" scores are 60-100  TODAY'S TREATMENT:                                                                                                                                          08/16/22: NEEDS clinical swallow assessment next session.  Pt reported aphonia after 5-10 minutes on the phone with a friend on the phone over the weekend. Pt states she "lost my voice" after the first PhoRTE exercise last week and with SLP instructions, stopped PhoRTE. Today SLP focused on abdominal breathing and straw exhale in and out of H2O to relax voice and balance airflow/improve breath support. Pt blew into straw with average 5 seconds and into water with average 7.7 seconds. Pt to chart/report on the quality of her voice after she practices straw exhalation each of three times per day, 10 reps, in order for SLP to track any trends.  08/12/22: Entered with strained vocal quality with variations in pitch and loudness. Overt impaired breath support observed with usual noisy breathing, segmented sentences, and occasional aphonia. Reported recent allergies impacting her today, resulting in increased shakiness and feeling panicky. SLP instructed abdominal breathing with focus on upper body relaxation, in which pt reported significant benefit. Reported she is aware she frequently holds her breath. Encouraged increased awareness of breath holding and focusing on continuous breath (I.e. "wave on a shore"). Introduced Semi-Occluded Vocal Tract Exercises (SOVTE) today to target breath support and reduce tension. Pt able to demo straw exercises (breathing and humming into/out of water) with intermittent mod cues. Duration of exhalations limited to 3-5 seconds. Notable  difficulty humming into water d/t increased coordination and resistance. Updated HEP to focus on breath support and SOVTE. Of note, pt planning to f/u with MD tomorrow re: bronchitis, allergies, and pulmonology referral.   (Eval) 08/03/22: SLP assisted pt through teaching her Phonatory Resistance Training Exercises (PhoRTE) exercises - she req'd mod A to make numbers half the length of the SLP on the training video on youTube (see pt instructions). PT PERFORMED ALL PhoRTE EXERCISES WITH PALM PUSH TO FOSTER VOCAL FOLD CLOSURE. She trialed palm push, pulling up on chair seat, and pushing on chair arms and she and SLP agreed /a/ appeared longer (2-3 seconds in length) with palm push.  PATIENT EDUCATION: Education details: See"today's treatment" Person educated: Patient Education method: Explanation, Demonstration, Verbal cues, and Handouts Education comprehension: verbalized understanding, returned demonstration, verbal cues  required, and needs further education  HOME EXERCISE PROGRAM: PhoRTE, SOVTE  GOALS: Goals reviewed with patient? No  SHORT TERM GOALS: Target date: 09/08/22  Pt will complete PhoRTE exercises with rare min A in 2 sessions Baseline: Goal status: IN PROGRESS  2.  Pt will undergo clinical swallow assessment in first 3 sessions and objective swallow eval may be performed if clinically indicated; ST and LT goals added PRN Baseline:  Goal status: REVISED   LONG TERM GOALS: Target date: 10/13/22  Pt will complete PhoRTE exercises with modified independence in 3 sessions Baseline:  Goal status: IN PROGRESS  2.  Pt sustain /a/ measure will incr to average 4 seconds in 2 sessions without compensatory measures. Baseline:  Goal status: IN PROGRESS  3.  Pt's VHI and VR-QOL will improve compared to scores today, when administered in the last two ST sessions Baseline:  Goal status: IN PROGRESS   ASSESSMENT:  CLINICAL IMPRESSION: Patient is a 72 y.o. F who was seen today  for decr'd voice quality after ENT evaluation in October 2023 identifying bowed vocal folds. Pt was dx'd with Parkinsonism in May 2023. A bedside swallow assessment (clinical swallow assessment) will take place in first three sessions and pt will be referred for MBS or FEES if/as clinically indicated. Initiated education and instruction of abdominal breathing, SOVTE, and PHOrTE to optimize breath support and vocal quality.   OBJECTIVE IMPAIRMENTS: include voice disorder and dysphagia. These impairments are limiting patient from managing appointments, household responsibilities, ADLs/IADLs, effectively communicating at home and in community, and safety when swallowing. Factors affecting potential to achieve goals and functional outcome are co-morbidities and severity of impairments.. Patient will benefit from skilled SLP services to address above impairments and improve overall function.  REHAB POTENTIAL: Fair given severity  PLAN:  SLP FREQUENCY: 1-2x/week  SLP DURATION: 8 weeks  PLANNED INTERVENTIONS: Aspiration precaution training, Pharyngeal strengthening exercises, Diet toleration management , Environmental controls, Trials of upgraded texture/liquids, Internal/external aids, Oral motor exercises, Multimodal communication approach, SLP instruction and feedback, Compensatory strategies, Patient/family education, and PhoRTE exercises    [place], [place] 08/16/2022, 3:55 PM

## 2022-08-17 ENCOUNTER — Telehealth (INDEPENDENT_AMBULATORY_CARE_PROVIDER_SITE_OTHER): Payer: Medicare PPO | Admitting: Adult Health

## 2022-08-17 ENCOUNTER — Encounter: Payer: Self-pay | Admitting: Adult Health

## 2022-08-17 DIAGNOSIS — F411 Generalized anxiety disorder: Secondary | ICD-10-CM

## 2022-08-17 DIAGNOSIS — G20B2 Parkinson's disease with dyskinesia, with fluctuations: Secondary | ICD-10-CM | POA: Diagnosis not present

## 2022-08-17 DIAGNOSIS — R062 Wheezing: Secondary | ICD-10-CM | POA: Diagnosis not present

## 2022-08-17 DIAGNOSIS — R490 Dysphonia: Secondary | ICD-10-CM | POA: Diagnosis not present

## 2022-08-17 DIAGNOSIS — Z6823 Body mass index (BMI) 23.0-23.9, adult: Secondary | ICD-10-CM | POA: Diagnosis not present

## 2022-08-17 MED ORDER — BUSPIRONE HCL 10 MG PO TABS: 10.0000 mg | ORAL_TABLET | Freq: Three times a day (TID) | ORAL | 3 refills | Status: DC

## 2022-08-17 MED ORDER — GABAPENTIN 300 MG PO CAPS: 300.0000 mg | ORAL_CAPSULE | Freq: Three times a day (TID) | ORAL | 3 refills | Status: DC

## 2022-08-17 NOTE — Progress Notes (Addendum)
MAXWELL ODER [id number] 1950/07/09 72 y.o.   Virtual Visit via Video Note  I connected with pt @ on 08/17/22 at  2:00 PM EDT by a video enabled telemedicine application and verified that I am speaking with the correct person using two identifiers.   I discussed the limitations of evaluation and management by telemedicine and the availability of in person appointments. The patient expressed understanding and agreed to proceed.  I discussed the assessment and treatment plan with the patient. The patient was provided an opportunity to ask questions and all were answered. The patient agreed with the plan and demonstrated an understanding of the instructions.   The patient was advised to call back or seek an in-person evaluation if the symptoms worsen or if the condition fails to improve as anticipated.  I provided 10 minutes of non-face-to-face time during this encounter.  The patient was located at home.  The provider was located at [place].   [name], NP    Subjective:   Patient ID:  Morgan Reese is a 72 y.o. (DOB 12/22/1950) female.  Chief Complaint: No chief complaint on file.   HPI Morgan Reese presents to the office today for follow-up of anxiety disorder.  Describes mood today as "better". Pleasant. Denies tearfulness. Mood symptoms - reports some depression, anxiety, and irritability - "at times". Mood is consistent. Stating "I'm doing ok". Feels like medications are helpful. Improved interest and motivation. Taking medications as prescribed. Energy levels lower. Active, has a regular exercise routine - going to the gym. Enjoys some usual interests and activities. Married - lives with husband. Has 2 adult children. Spending time with family.  Appetite adequate. Weight gain - 127 to 130 pounds. Sleeps well most nights. Averages 6 to 7 hours. Reports daytime napping. Focus and concentration generally ok. Completing tasks. Managing aspects of  household. Retired. Denies SI or HI.  Denies AH or VH. Denies self harm. Denies substance use. Denies paranoia.   [place] ED from 04/21/2022 in [facility] Emergency Department at [facility] ED from 02/07/2022 in [facility] Emergency Department at [facility] ED from 01/17/2022 in [facility] Emergency Department at [place] No Risk No Risk No Risk        Review of Systems:  Review of Systems  Musculoskeletal:  Negative for gait problem.  Neurological:  Negative for tremors.  Psychiatric/Behavioral:         Please refer to HPI    Medications: I have reviewed the patient's current medications.  Current Outpatient Medications  Medication Sig Dispense Refill   Ascorbic Acid (VITAMIN C) 1000 MG tablet Take 1,000 mg by mouth daily.     busPIRone (BUSPAR) 10 MG tablet Take 1 tablet (10 mg total) by mouth 3 (three) times daily. 270 tablet 3   Cholecalciferol (VITAMIN D3 PO) Take by mouth. Alternates 10,000 units with 5,000 units each day.     COPPER PO Take 30 mg by mouth 2 (two) times a week.     doxycycline (VIBRAMYCIN) 100 MG capsule Take 1 capsule (100 mg total) by mouth 2 (two) times daily. (Patient not taking: Reported on 08/04/2022) 19 capsule 0   estradiol (ESTRACE) 0.1 MG/GM vaginal cream Place 1 Applicatorful vaginally at bedtime.     estradiol (VIVELLE-DOT) 0.0375 MG/24HR Place 1 patch onto the skin 2 (two) times a week.     gabapentin (NEURONTIN) 300 MG capsule Take 1 capsule (300 mg total) by mouth 3 (three)  times daily. 270 capsule 3   levothyroxine (SYNTHROID) 50 MCG tablet Take 50 mcg by mouth daily before breakfast.     Magnesium Citrate 200 MG TABS Take 600 mg by mouth daily.     melatonin 3 MG TABS tablet Take 6-9 mg by mouth at bedtime.     NON FORMULARY Apply 2 % topically daily.     Omega-3 Fatty Acids (FISH OIL) 1000 MG CAPS Take 1 capsule by mouth at bedtime.     progesterone (PROMETRIUM) 200 MG capsule Take  200 mg by mouth daily.     pyridOXINE (B-6) 50 MG tablet Take 50 mg by mouth daily.     rasagiline (AZILECT) 1 MG TABS tablet Take 1 tablet (1 mg total) by mouth daily. (Patient not taking: Reported on 08/04/2022) 90 tablet 1   thiamine 100 MG tablet Take 100 mg by mouth daily.     VITAMIN K PO Take by mouth daily. MK7     zinc gluconate 50 MG tablet Take 50 mg by mouth daily.     No current facility-administered medications for this visit.    Medication Side Effects: None  Allergies:  Allergies  Allergen Reactions   Amoxicillin Swelling    REACTION: Head felt over-heated Other reaction(s): Other (See Comments) REACTION: Head felt over-heated    Serotonin Reuptake Inhibitors (Ssris) Other (See Comments)    Other reaction(s): hyponatremia SSRI's are contraindicated, causes hyponatremia     Past Medical History:  Diagnosis Date   Hypertension    Parkinson's disease    Sinus congestion    Thyroid disease     Past Medical History, Surgical history, Social history, and Family history were reviewed and updated as appropriate.   Please see review of systems for further details on the patient's review from today.   Objective:   Physical Exam:  There were no vitals taken for this visit.  Physical Exam Constitutional:      General: She is not in acute distress. Musculoskeletal:        General: No deformity.  Neurological:     Mental Status: She is alert and oriented to person, place, and time.     Coordination: Coordination normal.  Psychiatric:        Attention and Perception: Attention and perception normal. She does not perceive auditory or visual hallucinations.        Mood and Affect: Mood normal. Mood is not anxious or depressed. Affect is not labile, blunt, angry or inappropriate.        Speech: Speech normal.        Behavior: Behavior normal.        Thought Content: Thought content normal. Thought content is not paranoid or delusional. Thought content does not  include homicidal or suicidal ideation. Thought content does not include homicidal or suicidal plan.        Cognition and Memory: Cognition and memory normal.        Judgment: Judgment normal.     Comments: Insight intact     Lab Review:     Component Value Date/Time   NA 135 04/21/2022 1636   K 3.6 04/21/2022 1636   CL 100 04/21/2022 1410   CO2 23 04/21/2022 1410   GLUCOSE 116 (H) 04/21/2022 1410   BUN 18 04/21/2022 1410   CREATININE 0.58 04/21/2022 1410   CALCIUM 8.8 (L) 04/21/2022 1410   PROT 6.2 (L) 04/21/2022 1410   ALBUMIN 4.0 04/21/2022 1410   AST 25 04/21/2022 1410  ALT 29 04/21/2022 1410   ALKPHOS 59 04/21/2022 1410   BILITOT 0.5 04/21/2022 1410   GFRNONAA >60 04/21/2022 1410       Component Value Date/Time   WBC 5.7 04/21/2022 1410   RBC 4.32 04/21/2022 1410   HGB 13.3 04/21/2022 1636   HCT 39.0 04/21/2022 1636   PLT 196 04/21/2022 1410   MCV 90.7 04/21/2022 1410   MCH 30.6 04/21/2022 1410   MCHC 33.7 04/21/2022 1410   RDW 12.6 04/21/2022 1410   LYMPHSABS 1.1 04/21/2022 1410   MONOABS 0.4 04/21/2022 1410   EOSABS 0.1 04/21/2022 1410   BASOSABS 0.1 04/21/2022 1410    No results found for: "POCLITH", "LITHIUM"   No results found for: "PHENYTOIN", "PHENOBARB", "VALPROATE", "CBMZ"   .res Assessment: Plan:    Plan:  Buspar 10mg  - three times daily for anxiety  Gabapentin 300mg  TID - anxiety  PDMP reviewed  Upcoming appt with pain management - [place] pain  Discussed potential benefits, risk, and side effects of benzodiazepines to include potential risk of tolerance and dependence, as well as possible drowsiness. Advised patient not to drive if experiencing drowsiness and to take lowest possible effective dose to minimize risk of dependence and tolerance.   Time spent with patient was 20 minutes. Greater than 50% of face to face time with patient was spent on counseling and coordination of care.    RTC 3 months  Diagnoses and all orders for  this visit:  Generalized anxiety disorder -     gabapentin (NEURONTIN) 300 MG capsule; Take 1 capsule (300 mg total) by mouth 3 (three) times daily. -     busPIRone (BUSPAR) 10 MG tablet; Take 1 tablet (10 mg total) by mouth 3 (three) times daily.     Please see After Visit Summary for patient specific instructions.  Future Appointments  Date Time Provider [place]  09/01/2022  1:00 PM Shanon Ace, LCSW [facility] None  09/02/2022  2:45 PM Sharen Counter, CCC-SLP [facility]  10/19/2022  2:30 PM Tat, Eustace Quail, DO [facility] None    No orders of the defined types were placed in this encounter.   -------------------------------

## 2022-08-17 NOTE — Progress Notes (Deleted)
Morgan Reese [id number] [birth year] 72 y.o.  Virtual Visit via Video Note  I connected with pt @ on 08/17/22 at  2:00 PM EDT by a video enabled telemedicine application and verified that I am speaking with the correct person using two identifiers.   I discussed the limitations of evaluation and management by telemedicine and the availability of in person appointments. The patient expressed understanding and agreed to proceed.  I discussed the assessment and treatment plan with the patient. The patient was provided an opportunity to ask questions and all were answered. The patient agreed with the plan and demonstrated an understanding of the instructions.   The patient was advised to call back or seek an in-person evaluation if the symptoms worsen or if the condition fails to improve as anticipated.  I provided *** minutes of non-face-to-face time during this encounter.  The patient was located at home.  The provider was located at [place].   [name], NP   Subjective:   Patient ID:  Morgan Reese is a 72 y.o. (DOB 03-30-1951) female.  Chief Complaint: No chief complaint on file.   HPI Morgan Reese presents for follow-up of ***   Review of Systems:  Review of Systems  Medications: {medication reviewed/display:3041432}  Current Outpatient Medications  Medication Sig Dispense Refill   Ascorbic Acid (VITAMIN C) 1000 MG tablet Take 1,000 mg by mouth daily.     busPIRone (BUSPAR) 10 MG tablet Take 1 tablet (10 mg total) by mouth 3 (three) times daily. 90 tablet 2   Cholecalciferol (VITAMIN D3 PO) Take by mouth. Alternates 10,000 units with 5,000 units each day.     COPPER PO Take 30 mg by mouth 2 (two) times a week.     doxycycline (VIBRAMYCIN) 100 MG capsule Take 1 capsule (100 mg total) by mouth 2 (two) times daily. (Patient not taking: Reported on 08/04/2022) 19 capsule 0   estradiol (ESTRACE) 0.1 MG/GM vaginal cream Place 1 Applicatorful  vaginally at bedtime.     estradiol (VIVELLE-DOT) 0.0375 MG/24HR Place 1 patch onto the skin 2 (two) times a week.     gabapentin (NEURONTIN) 300 MG capsule Take 1 capsule (300 mg total) by mouth 3 (three) times daily. 90 capsule 2   levothyroxine (SYNTHROID) 50 MCG tablet Take 50 mcg by mouth daily before breakfast.     Magnesium Citrate 200 MG TABS Take 600 mg by mouth daily.     melatonin 3 MG TABS tablet Take 6-9 mg by mouth at bedtime.     NON FORMULARY Apply 2 % topically daily.     Omega-3 Fatty Acids (FISH OIL) 1000 MG CAPS Take 1 capsule by mouth at bedtime.     progesterone (PROMETRIUM) 200 MG capsule Take 200 mg by mouth daily.     pyridOXINE (B-6) 50 MG tablet Take 50 mg by mouth daily.     rasagiline (AZILECT) 1 MG TABS tablet Take 1 tablet (1 mg total) by mouth daily. (Patient not taking: Reported on 08/04/2022) 90 tablet 1   thiamine 100 MG tablet Take 100 mg by mouth daily.     VITAMIN K PO Take by mouth daily. MK7     zinc gluconate 50 MG tablet Take 50 mg by mouth daily.     No current facility-administered medications for this visit.    Medication Side Effects: {Medication Side Effects (Optional):21014029}  Allergies:  Allergies  Allergen Reactions   Amoxicillin Swelling    REACTION: Head felt over-heated Other  reaction(s): Other (See Comments) REACTION: Head felt over-heated    Serotonin Reuptake Inhibitors (Ssris) Other (See Comments)    Other reaction(s): hyponatremia SSRI's are contraindicated, causes hyponatremia     Past Medical History:  Diagnosis Date   Hypertension    Parkinson's disease    Sinus congestion    Thyroid disease     Family History  Problem Relation Age of Onset   Tremor Mother    Depression Mother    Cancer Father        Lung Cancer   Tremor Maternal Grandmother    Diabetes Paternal Grandmother    Stroke Paternal Uncle    Healthy Child    Parkinson's disease Neg Hx     Social History   Socioeconomic History   Marital  status: Married    Spouse name: Not on file   Number of children: 2   Years of education: Not on file   Highest education level: Not on file  Occupational History   Occupation: retired    Comment: PhD; early childhood education  Tobacco Use   Smoking status: Never   Smokeless tobacco: Never  Vaping Use   Vaping Use: Never used  Substance and Sexual Activity   Alcohol use: Never   Drug use: Never   Sexual activity: Not on file  Other Topics Concern   Not on file  Social History Narrative   Right handed   Caffeine: none    Lives in a two story home with main bedroom on first floor.    Social Determinants of Health   Financial Resource Strain: Not on file  Food Insecurity: Not on file  Transportation Needs: Not on file  Physical Activity: Not on file  Stress: Not on file  Social Connections: Not on file  Intimate Partner Violence: Not on file    Past Medical History, Surgical history, Social history, and Family history were reviewed and updated as appropriate.   Please see review of systems for further details on the patient's review from today.   Objective:   Physical Exam:  There were no vitals taken for this visit.  Physical Exam  Lab Review:     Component Value Date/Time   NA 135 04/21/2022 1636   K 3.6 04/21/2022 1636   CL 100 04/21/2022 1410   CO2 23 04/21/2022 1410   GLUCOSE 116 (H) 04/21/2022 1410   BUN 18 04/21/2022 1410   CREATININE 0.58 04/21/2022 1410   CALCIUM 8.8 (L) 04/21/2022 1410   PROT 6.2 (L) 04/21/2022 1410   ALBUMIN 4.0 04/21/2022 1410   AST 25 04/21/2022 1410   ALT 29 04/21/2022 1410   ALKPHOS 59 04/21/2022 1410   BILITOT 0.5 04/21/2022 1410   GFRNONAA >60 04/21/2022 1410       Component Value Date/Time   WBC 5.7 04/21/2022 1410   RBC 4.32 04/21/2022 1410   HGB 13.3 04/21/2022 1636   HCT 39.0 04/21/2022 1636   PLT 196 04/21/2022 1410   MCV 90.7 04/21/2022 1410   MCH 30.6 04/21/2022 1410   MCHC 33.7 04/21/2022 1410   RDW  12.6 04/21/2022 1410   LYMPHSABS 1.1 04/21/2022 1410   MONOABS 0.4 04/21/2022 1410   EOSABS 0.1 04/21/2022 1410   BASOSABS 0.1 04/21/2022 1410    No results found for: "POCLITH", "LITHIUM"   No results found for: "PHENYTOIN", "PHENOBARB", "VALPROATE", "CBMZ"   .res Assessment: Plan:    There are no diagnoses linked to this encounter.   Please see After Visit Summary  for patient specific instructions.  Future Appointments  Date Time Provider [place]  08/17/2022  2:00 PM Seini Lannom, Berdie Ogren, NP [facility] None  09/01/2022  1:00 PM Shanon Ace, LCSW [facility] None  09/02/2022  2:45 PM Sharen Counter, CCC-SLP [facility]  10/19/2022  2:30 PM Tat, Eustace Quail, DO [facility] None    No orders of the defined types were placed in this encounter.     -------------------------------

## 2022-08-18 DIAGNOSIS — C44519 Basal cell carcinoma of skin of other part of trunk: Secondary | ICD-10-CM | POA: Diagnosis not present

## 2022-09-01 ENCOUNTER — Ambulatory Visit: Payer: Medicare PPO | Admitting: Psychiatry

## 2022-09-01 NOTE — Progress Notes (Signed)
  Started session with patient but within the first minute or so it was obvious she was not going to be able to continue due to health condition.  Her voice was barely audible and it was uncomfortable for her.  She asked if we could reschedule so that is what we did.  Canceling this appointment today and there is no charge. NC1

## 2022-09-02 ENCOUNTER — Ambulatory Visit: Payer: Medicare PPO | Attending: Family Medicine

## 2022-09-02 DIAGNOSIS — R1310 Dysphagia, unspecified: Secondary | ICD-10-CM | POA: Insufficient documentation

## 2022-09-02 DIAGNOSIS — R490 Dysphonia: Secondary | ICD-10-CM | POA: Insufficient documentation

## 2022-09-02 NOTE — Therapy (Signed)
OUTPATIENT SPEECH LANGUAGE PATHOLOGY VOICE TREATMENT   Patient Name: Morgan Reese MRN: [id number] DOB:February 04, 1951, 72 y.o., female Today's Date: 09/02/2022  PCP: Moshe Cipro, NP REFERRING PROVIDER: Eustaquio Boyden, DO  END OF SESSION:  End of Session - 09/02/22 1718     Visit Number 4    Number of Visits 17    Date for SLP Re-Evaluation 10/13/22    SLP Start Time 1450    SLP Stop Time  1536    SLP Time Calculation (min) 46 min    Activity Tolerance Patient tolerated treatment well                Past Medical History:  Diagnosis Date   Hypertension    Parkinson's disease    Sinus congestion    Thyroid disease    Past Surgical History:  Procedure Laterality Date   CATARACT EXTRACTION, BILATERAL     NASAL SINUS SURGERY     TUBAL LIGATION     Patient Active Problem List   Diagnosis Date Noted   Parkinson's disease without dyskinesia or fluctuating manifestations 04/12/2022   Leg pain 03/30/2022   Disorder of female genital organs 06/26/2021   Midline cystocele 06/26/2021   Generalized anxiety disorder 06/26/2021   Hyponatremia 03/10/2021   Preventive measure 10/07/2014   Hypothyroidism 10/07/2014    Onset date: 2 years ago  REFERRING DIAG:  G20.B1 (ICD-10-CM) - Parkinson's disease with dyskinesia, without mention of fluctuations    THERAPY DIAG:  Dysphagia, unspecified type  Hoarseness  Rationale for Evaluation and Treatment: Rehabilitation  SUBJECTIVE:   SUBJECTIVE STATEMENT: "I have had bronchitis two times (since I saw Dr. Christell Constant)." "(My voice) is worse today, yes." Pt accompanied by: self  PERTINENT HISTORY:  Dr. Candace Cruise. Christell Constant, ENT ([facility] [place]) 03/01/22 Associated Problem(s): Hoarseness 1 year. Started after COVID. Much improved now than it was when it first started. Non-smoker. No heartburn. EXAM shows moderately breathy voice. No stridor. Oral cavity was unremarkable. Indirect laryngoscopy showed strong gag reflex. Neck  without adenopathy mass or thyromegaly. FLEXIBLE LARYNGOSCOPY shows normal nasal and nasopharyngeal mucosa hypopharynx is normal. Vocal cord shows no concerning lesions. Very sluggish bilateral movement with bowing of the vocal cords. PLAN: Reassured no concerning lesions. She has pretty significant bowing. As she is trending improving, continue observation for improvement is reasonable. She will reach out if symptoms remain bothersome. Otherwise follow-up as needed. Electronically signed by Richardson Landry  Dr. Edythe Lynn, neurologist ([facility]) 06/15/22 Mrs. CRISTIN GRENFELL returns today for work in return follow-up of her parkinsonism. She was last seen on 04/30/2022. This appointment was scheduled after she sent several MyChart messages expressing concern regarding side effects associated with Sinemet including severe anxiety. Subsequently, we discontinued Sinemet and she is now on Mirapex 1/2 three times daily. Mrs. Goebel reports that with Sinemet therapy she felt terrible but she recognized that her voice symptoms improved and her tremor improved. To date, she has tolerated Mirapex well except that it is making her very sleepy. We discussed slow continued titration to reach a target dose of 1 3 times daily. I recommended she not drive or engage in any potentially dangerous activities if she is sleeping. Initial antibody test is negative for myasthenia gravis. In the office today we have reviewed her cranial MRI performed on 05/13/2022. She has mild accelerated deep white matter changes and ventriculomegaly with mild central atrophy. Past Medical History, Past Surgery History, Allergies, Social History, and Family History were reviewed and updated as appropriate.  PAIN: Are you having pain? No   PATIENT GOALS: "I want my voice back!"  OBJECTIVE:   SWALLOWING  ORAL MOTOR EXAMINATION: Overall status: Impaired:   Labial: Bilateral (Strength and Coordination) Lingual: Bilateral (Strength  and Coordination) Velum: ROM Cough: Weak Comments: Lt worse than rt labial and lingual weakness  CLINICAL SWALLOW ASSESSMENT:   Current diet: regular and thin liquids Dentition: adequate natural dentition Patient directly observed with POs: Yes: thin liquids and pt politely refused solids (fig bar, cereal bar, peanut butter crackers) due to gluten . For this reason, SLP did not provide pt solids. Feeding: able to feed self Liquids provided by: cup Oral phase signs and symptoms:  none noted Pharyngeal phase signs and symptoms: 1) consistent audible swallow, and 2) immediate throat clear x1 swallow, and immediate cough x1 swallow, out of 5 swallows Pt told SLP she has coughed with orange slices and grapes in the last 7 days, along with water.  PATIENT REPORTED OUTCOME MEASURES (PROM): V-RQOL: raw score=29, VR-QOL score=52.5 ("poor-fair"),  and VHI: 75 ("Severe" scores are 60-100  TODAY'S TREATMENT:                                                                                                                                          09/02/22: Pt voice is largely aphonic today - voicing for approx 1 second/utterance on exhale, then takes another breath. Wheezing on inhale. SLP to assess pt's swallow clinically today (See above). SLP told pt that her follow up appointment with her ENT was not scheduled - but that ENT told pt if she wanted follow up to schedule with [facility]. SLP urged pt to do this, to get greatest opportunity of definitive diagnosis due to Parkinsonism, and previously-diagnosed "bowed vocal folds" with [facility] [place].  SLP suspects pt would benefit most from FEES so that SLP can visualize vocal folds.  08/16/22: NEEDS clinical swallow assessment next session.  Pt reported aphonia after 5-10 minutes on the phone with a friend on the phone over the weekend. Pt states she "lost my voice" after the first PhoRTE exercise last week and with SLP instructions,  stopped PhoRTE. Today SLP focused on abdominal breathing and straw exhale in and out of H2O to relax voice and balance airflow/improve breath support. Pt blew into straw with average 5 seconds and into water with average 7.7 seconds. Pt to chart/report on the quality of her voice after she practices straw exhalation each of three times per day, 10 reps, in order for SLP to track any trends.  08/12/22: Entered with strained vocal quality with variations in pitch and loudness. Overt impaired breath support observed with usual noisy breathing, segmented sentences, and occasional aphonia. Reported recent allergies impacting her today, resulting in increased shakiness and feeling panicky. SLP instructed abdominal breathing with focus on upper body relaxation, in which pt reported significant benefit. Reported she  is aware she frequently holds her breath. Encouraged increased awareness of breath holding and focusing on continuous breath (I.e. "wave on a shore"). Introduced Semi-Occluded Vocal Tract Exercises (SOVTE) today to target breath support and reduce tension. Pt able to demo straw exercises (breathing and humming into/out of water) with intermittent mod cues. Duration of exhalations limited to 3-5 seconds. Notable difficulty humming into water d/t increased coordination and resistance. Updated HEP to focus on breath support and SOVTE. Of note, pt planning to f/u with MD tomorrow re: bronchitis, allergies, and pulmonology referral.   (Eval) 08/03/22: SLP assisted pt through teaching her Phonatory Resistance Training Exercises (PhoRTE) exercises - she req'd mod A to make numbers half the length of the SLP on the training video on youTube (see pt instructions). PT PERFORMED ALL PhoRTE EXERCISES WITH PALM PUSH TO FOSTER VOCAL FOLD CLOSURE. She trialed palm push, pulling up on chair seat, and pushing on chair arms and she and SLP agreed /a/ appeared longer (2-3 seconds in length) with palm push.  PATIENT  EDUCATION: Education details: See"today's treatment" Person educated: Patient Education method: Programmer, multimediaxplanation, Demonstration, Verbal cues, and Handouts Education comprehension: verbalized understanding, returned demonstration, verbal cues required, and needs further education  HOME EXERCISE PROGRAM: PhoRTE, SOVTE  GOALS: Goals reviewed with patient? No  SHORT TERM GOALS: Target date: 09/08/22  Pt will complete PhoRTE exercises with rare min A in 2 sessions Baseline: Goal status: IN PROGRESS  2.  Pt will undergo clinical swallow assessment in first 3 sessions and objective swallow eval may be performed if clinically indicated; ST and LT goals added PRN Baseline:  Goal status: REVISED   LONG TERM GOALS: Target date: 10/13/22  Pt will complete PhoRTE exercises with modified independence in 3 sessions Baseline:  Goal status: IN PROGRESS  2.  Pt sustain /a/ measure will incr to average 4 seconds in 2 sessions without compensatory measures. Baseline:  Goal status: IN PROGRESS  3.  Pt's VHI and VR-QOL will improve compared to scores today, when administered in the last two ST sessions Baseline:  Goal status: IN PROGRESS   ASSESSMENT:  CLINICAL IMPRESSION: Patient is a 72 y.o. F who was seen today for decr'd voice quality after ENT evaluation in October 2023 identifying bowed vocal folds. Pt was dx'd with Parkinsonism in May 2023. A bedside swallow assessment (clinical swallow assessment) was completed today and MBS or FEES was recommended. SLP suspects FEES may provide more diagnostic information given pt's dx of "bowed vocal folds"  SLP told pt to cont with SOVTE, and PHOrTE to optimize breath support and vocal quality.   OBJECTIVE IMPAIRMENTS: include voice disorder and dysphagia. These impairments are limiting patient from managing appointments, household responsibilities, ADLs/IADLs, effectively communicating at home and in community, and safety when swallowing. Factors affecting  potential to achieve goals and functional outcome are co-morbidities and severity of impairments.. Patient will benefit from skilled SLP services to address above impairments and improve overall function.  REHAB POTENTIAL: Fair given severity  PLAN:  SLP FREQUENCY: 1-2x/week  SLP DURATION: 8 weeks  PLANNED INTERVENTIONS: Aspiration precaution training, Pharyngeal strengthening exercises, Diet toleration management , Environmental controls, Trials of upgraded texture/liquids, Internal/external aids, Oral motor exercises, Multimodal communication approach, SLP instruction and feedback, Compensatory strategies, Patient/family education, and PhoRTE exercises    Kenedy Haisley, CCC-SLP 09/02/2022, 5:19 PM

## 2022-09-05 DIAGNOSIS — J40 Bronchitis, not specified as acute or chronic: Secondary | ICD-10-CM | POA: Diagnosis not present

## 2022-09-05 DIAGNOSIS — Z03818 Encounter for observation for suspected exposure to other biological agents ruled out: Secondary | ICD-10-CM | POA: Diagnosis not present

## 2022-09-05 DIAGNOSIS — R0981 Nasal congestion: Secondary | ICD-10-CM | POA: Diagnosis not present

## 2022-09-05 DIAGNOSIS — R059 Cough, unspecified: Secondary | ICD-10-CM | POA: Diagnosis not present

## 2022-09-05 DIAGNOSIS — J040 Acute laryngitis: Secondary | ICD-10-CM | POA: Diagnosis not present

## 2022-09-05 DIAGNOSIS — R509 Fever, unspecified: Secondary | ICD-10-CM | POA: Diagnosis not present

## 2022-09-05 DIAGNOSIS — R52 Pain, unspecified: Secondary | ICD-10-CM | POA: Diagnosis not present

## 2022-09-07 ENCOUNTER — Ambulatory Visit: Payer: Medicare PPO

## 2022-09-07 DIAGNOSIS — R490 Dysphonia: Secondary | ICD-10-CM | POA: Diagnosis not present

## 2022-09-07 DIAGNOSIS — R1310 Dysphagia, unspecified: Secondary | ICD-10-CM

## 2022-09-07 NOTE — Therapy (Unsigned)
OUTPATIENT SPEECH LANGUAGE PATHOLOGY VOICE TREATMENT   Patient Name: Morgan Reese MRN: [id number] DOB:26-Mar-1951, 72 y.o., female Today's Date: 09/07/2022  PCP: Moshe Cipro, NP REFERRING PROVIDER: Eustaquio Boyden, DO  END OF SESSION:  End of Session - 09/07/22 2247     Visit Number 5    Number of Visits 17    Date for SLP Re-Evaluation 10/13/22    SLP Start Time 1535    SLP Stop Time  1619    SLP Time Calculation (min) 44 min    Activity Tolerance Patient tolerated treatment well                Past Medical History:  Diagnosis Date   Hypertension    Parkinson's disease    Sinus congestion    Thyroid disease    Past Surgical History:  Procedure Laterality Date   CATARACT EXTRACTION, BILATERAL     NASAL SINUS SURGERY     TUBAL LIGATION     Patient Active Problem List   Diagnosis Date Noted   Parkinson's disease without dyskinesia or fluctuating manifestations 04/12/2022   Leg pain 03/30/2022   Disorder of female genital organs 06/26/2021   Midline cystocele 06/26/2021   Generalized anxiety disorder 06/26/2021   Hyponatremia 03/10/2021   Preventive measure 10/07/2014   Hypothyroidism 10/07/2014    Onset date: 2 years ago  REFERRING DIAG:  G20.B1 (ICD-10-CM) - Parkinson's disease with dyskinesia, without mention of fluctuations    THERAPY DIAG:  Hoarseness  Dysphagia, unspecified type  Rationale for Evaluation and Treatment: Rehabilitation  SUBJECTIVE:   SUBJECTIVE STATEMENT: "I have had bronchitis two times (since I saw Dr. Christell Constant)." "(My voice) is worse today, yes." Pt accompanied by: self  PERTINENT HISTORY:  Dr. Candace Cruise. Christell Constant, ENT ([facility] [place]) 03/01/22 Associated Problem(s): Hoarseness 1 year. Started after COVID. Much improved now than it was when it first started. Non-smoker. No heartburn. EXAM shows moderately breathy voice. No stridor. Oral cavity was unremarkable. Indirect laryngoscopy showed strong gag reflex. Neck  without adenopathy mass or thyromegaly. FLEXIBLE LARYNGOSCOPY shows normal nasal and nasopharyngeal mucosa hypopharynx is normal. Vocal cord shows no concerning lesions. Very sluggish bilateral movement with bowing of the vocal cords. PLAN: Reassured no concerning lesions. She has pretty significant bowing. As she is trending improving, continue observation for improvement is reasonable. She will reach out if symptoms remain bothersome. Otherwise follow-up as needed. Electronically signed by Richardson Landry  Dr. Edythe Lynn, neurologist ([facility]) 06/15/22 Mrs. Morgan Reese returns today for work in return follow-up of her parkinsonism. She was last seen on 04/30/2022. This appointment was scheduled after she sent several MyChart messages expressing concern regarding side effects associated with Sinemet including severe anxiety. Subsequently, we discontinued Sinemet and she is now on Mirapex 1/2 three times daily. [name] that with Sinemet therapy she felt terrible but she recognized that her voice symptoms improved and her tremor improved. To date, she has tolerated Mirapex well except that it is making her very sleepy. We discussed slow continued titration to reach a target dose of 1 3 times daily. I recommended she not drive or engage in any potentially dangerous activities if she is sleeping. Initial antibody test is negative for myasthenia gravis. In the office today we have reviewed her cranial MRI performed on 05/13/2022. She has mild accelerated deep white matter changes and ventriculomegaly with mild central atrophy. Past Medical History, Past Surgery History, Allergies, Social History, and Family History were reviewed and updated as appropriate.  PAIN: Are you having pain? No   PATIENT GOALS: "I want my voice back!"  OBJECTIVE:   SWALLOWING  ORAL MOTOR EXAMINATION: Overall status: Impaired:   Labial: Bilateral (Strength and Coordination) Lingual: Bilateral (Strength  and Coordination) Velum: ROM Cough: Weak Comments: Lt worse than rt labial and lingual weakness  CLINICAL SWALLOW ASSESSMENT:   Current diet: regular and thin liquids Dentition: adequate natural dentition Patient directly observed with POs: Yes: thin liquids and pt politely refused solids (fig bar, cereal bar, peanut butter crackers) due to gluten . For this reason, SLP did not provide pt solids. Feeding: able to feed self Liquids provided by: cup Oral phase signs and symptoms:  none noted Pharyngeal phase signs and symptoms: 1) consistent audible swallow, and 2) immediate throat clear x1 swallow, and immediate cough x1 swallow, out of 5 swallows Pt told SLP she has coughed with orange slices and grapes in the last 7 days, along with water.  PATIENT REPORTED OUTCOME MEASURES (PROM): V-RQOL: raw score=29, VR-QOL score=52.5 ("poor-fair"),  and VHI: 75 ("Severe" scores are 60-100  TODAY'S TREATMENT:                                                                                                                                          09/07/22: Second request for FEES sent to Dr. Atha Starks. SLP called physician's office to check up on this request. RN stated they could not access cosign orders via EP[employer] so SLP faxed request to [facility]'s office at [phone]. Pt has chosen to have follow up with initial ENT who examined pt in October and not with ENT in [facility] like pt told SLP last session. This is to occur 09/24/22. Follow up with neurologist is 09/10/22. Pt voice improved slightly today but pt cont with very poor breath support for speech comparable to previous session; Pt agrees this is worse than initial therapy session on 08/03/22. SLP performed SOVTE (straw vocalization in H2O) with pt today with limited improvement with preath support. It sounded to this trained ear that pt was losing significant amount of air through the glottis when vocalizing, as she spoke approx 25% of her utterances on  residual volume. Approx 2+ seconds of breathy voicing prior to air depletion and aphonic speech on residual volume. SLP wonders if pt's "Parkinsonism" is one which decr'd vocal quality could be expected. Pt rates her voice today a 3/10, her voice last session as a 1/10, and her voice on Sinemet 6/10. (Where 10=normal voice and 1=worst possible voice quality). SLP suggested voice therapy cont after pt sees both her neurologist and has a follow up ENT visit. SLP suggested [facility] as that was what she told SLP she was told to do, she but pt stated it would take too long to be seen there so she decided for f/u with Dr. Christell Constant. Kelli agreed it would be good to take a break  from ST at this time until after these follow ups.  09/02/22: Pt voice is largely aphonic today - voicing for approx 1 second/utterance on exhale, then takes another breath. Wheezing on inhale. SLP to assess pt's swallow clinically today (See above). SLP told pt that her follow up appointment with her ENT was not scheduled - but that ENT told pt if she wanted follow up to schedule with [facility]. SLP urged pt to do this, to get greatest opportunity of definitive diagnosis due to Parkinsonism, and previously-diagnosed "bowed vocal folds" with [facility] [place].  SLP suspects pt would benefit most from FEES so that SLP can visualize vocal folds.  08/16/22: NEEDS clinical swallow assessment next session.  Pt reported aphonia after 5-10 minutes on the phone with a friend on the phone over the weekend. Pt states she "lost my voice" after the first PhoRTE exercise last week and with SLP instructions, stopped PhoRTE. Today SLP focused on abdominal breathing and straw exhale in and out of H2O to relax voice and balance airflow/improve breath support. Pt blew into straw with average 5 seconds and into water with average 7.7 seconds. Pt to chart/report on the quality of her voice after she practices straw exhalation each of  three times per day, 10 reps, in order for SLP to track any trends.  08/12/22: Entered with strained vocal quality with variations in pitch and loudness. Overt impaired breath support observed with usual noisy breathing, segmented sentences, and occasional aphonia. Reported recent allergies impacting her today, resulting in increased shakiness and feeling panicky. SLP instructed abdominal breathing with focus on upper body relaxation, in which pt reported significant benefit. Reported she is aware she frequently holds her breath. Encouraged increased awareness of breath holding and focusing on continuous breath (I.e. "wave on a shore"). Introduced Semi-Occluded Vocal Tract Exercises (SOVTE) today to target breath support and reduce tension. Pt able to demo straw exercises (breathing and humming into/out of water) with intermittent mod cues. Duration of exhalations limited to 3-5 seconds. Notable difficulty humming into water d/t increased coordination and resistance. Updated HEP to focus on breath support and SOVTE. Of note, pt planning to f/u with MD tomorrow re: bronchitis, allergies, and pulmonology referral.   (Eval) 08/03/22: SLP assisted pt through teaching her Phonatory Resistance Training Exercises (PhoRTE) exercises - she req'd mod A to make numbers half the length of the SLP on the training video on youTube (see pt instructions). PT PERFORMED ALL PhoRTE EXERCISES WITH PALM PUSH TO FOSTER VOCAL FOLD CLOSURE. She trialed palm push, pulling up on chair seat, and pushing on chair arms and she and SLP agreed /a/ appeared longer (2-3 seconds in length) with palm push.  PATIENT EDUCATION: Education details: See"today's treatment" Person educated: Patient Education method: Programmer, multimedia, Demonstration, Verbal cues, and Handouts Education comprehension: verbalized understanding, returned demonstration, verbal cues required, and needs further education  HOME EXERCISE PROGRAM: PhoRTE, SOVTE  GOALS: Goals  reviewed with patient? No  SHORT TERM GOALS: Target date: 09/08/22  Pt will complete PhoRTE exercises with rare min A in 2 sessions Baseline: Goal status: IN PROGRESS  2.  Pt will undergo clinical swallow assessment in first 3 sessions and objective swallow eval may be performed if clinically indicated; ST and LT goals added PRN Baseline:  Goal status: REVISED   LONG TERM GOALS: Target date: 10/13/22  Pt will complete PhoRTE exercises with modified independence in 3 sessions Baseline:  Goal status: IN PROGRESS  2.  Pt sustain /a/ measure  will incr to average 4 seconds in 2 sessions without compensatory measures. Baseline:  Goal status: IN PROGRESS  3.  Pt's VHI and VR-QOL will improve compared to scores today, when administered in the last two ST sessions Baseline:  Goal status: IN PROGRESS   ASSESSMENT:  CLINICAL IMPRESSION: Patient is a 72 y.o. F who was seen today for decr'd voice quality after ENT evaluation in October 2023 identifying bowed vocal folds. Pt was dx'd with Parkinsonism in May 2023. SLP checked referrals and it does not appear a MBS or FEES was ordered/scheduled. SLP to follow up with this in next 12 hours. SLP suspects FEES may provide more diagnostic information given pt's dx of "bowed vocal folds"  SLP told pt to cont with SOVTE, and PHOrTE to optimize breath support and vocal quality.   OBJECTIVE IMPAIRMENTS: include voice disorder and dysphagia. These impairments are limiting patient from managing appointments, household responsibilities, ADLs/IADLs, effectively communicating at home and in community, and safety when swallowing. Factors affecting potential to achieve goals and functional outcome are co-morbidities and severity of impairments.. Patient will benefit from skilled SLP services to address above impairments and improve overall function.  REHAB POTENTIAL: Fair given severity  PLAN:  SLP FREQUENCY: 1-2x/week  SLP DURATION: 8 weeks  PLANNED  INTERVENTIONS: Aspiration precaution training, Pharyngeal strengthening exercises, Diet toleration management , Environmental controls, Trials of upgraded texture/liquids, Internal/external aids, Oral motor exercises, Multimodal communication approach, SLP instruction and feedback, Compensatory strategies, Patient/family education, and PhoRTE exercises    Brysen Shankman, CCC-SLP 09/07/2022, 10:49 PM

## 2022-09-09 DIAGNOSIS — R490 Dysphonia: Secondary | ICD-10-CM | POA: Diagnosis not present

## 2022-09-09 DIAGNOSIS — G20A1 Parkinson's disease without dyskinesia, without mention of fluctuations: Secondary | ICD-10-CM | POA: Diagnosis not present

## 2022-09-09 DIAGNOSIS — R42 Dizziness and giddiness: Secondary | ICD-10-CM | POA: Diagnosis not present

## 2022-09-09 DIAGNOSIS — Z79899 Other long term (current) drug therapy: Secondary | ICD-10-CM | POA: Diagnosis not present

## 2022-09-09 DIAGNOSIS — Z0000 Encounter for general adult medical examination without abnormal findings: Secondary | ICD-10-CM | POA: Diagnosis not present

## 2022-09-09 DIAGNOSIS — E039 Hypothyroidism, unspecified: Secondary | ICD-10-CM | POA: Diagnosis not present

## 2022-09-14 ENCOUNTER — Encounter: Payer: Self-pay | Admitting: Internal Medicine

## 2022-09-14 ENCOUNTER — Ambulatory Visit: Payer: Medicare PPO | Admitting: Internal Medicine

## 2022-09-14 VITALS — BP 134/74 | HR 98 | Temp 98.4°F | Ht 63.0 in | Wt 131.6 lb

## 2022-09-14 DIAGNOSIS — R490 Dysphonia: Secondary | ICD-10-CM | POA: Diagnosis not present

## 2022-09-14 DIAGNOSIS — R061 Stridor: Secondary | ICD-10-CM | POA: Diagnosis not present

## 2022-09-14 NOTE — Patient Instructions (Addendum)
Follow up with me as needed for shortness of breath, bronchitis episodes.  I think you need to see the ENT doctors at [facility] to determine next steps. I am concerned about your voice quality and stridor.  Follow up with Dr. Christell Constant. He may refer you to the laryngologists at [facility] if indicated - Drs. Rubye Oaks and Delford Field.   Continue speech therapy for now.   If addressing your vocal cord issues does not resolve your symptoms, please come back and see me and we can order breathing testing and next steps. Right now your symptoms seem less likely to be consistent with asthma.

## 2022-09-14 NOTE — Progress Notes (Signed)
Morgan Reese    [id number]    20-Jul-1950  Primary Care Physician:Matthews, Judeth Cornfield, NP  Referring Physician: Mardene Sayer, MD [address] [place],  [place] [address] Reason for Consultation: chronic bronchitis  Date of Consultation: 09/14/2022  Chief complaint:   Chief Complaint  Patient presents with   Consult    Chronic bronchitis, SOB, Laryngitis      HPI: Morgan Reese is a 72 y.o. woman who presents for new patient evaluation of chronic bronchitis and voice hoarseness.  Hoarseness has been present for many months and is getting worse. First episode of bronchitis started Nov 2023 and then again in Feb 2024.  She has been given steroids with prednisone by PCP with improvement of her symptoms. Also treated with abx for a sinus infection with doxycycline.   She notes seasonal allergies and takes cetirizine as needed. Allergies have been worse in the last couple months but this week have been better. Does however have frequent throat clearing.  She does have recent diagnosis of parkinson's disease - mild symptoms.   Has a harder time getting a deep breath in rather than exhaling. Notes stridor with quiet breathing. Husband says she wakes hersefl up at night a lot with breathing. She is feeling more fatigued. Sleep study six  months ago showed mild AHI<5.   Denies recurrent bronchitis, childhood respiratory disease.  Has been given both prednisone and inhalers without improvement.   Saw ENT Dr Christell Constant at [facility] in [place] last fall. Was told she had vocal cord bowing.   Social history:  Occupation: Programmer, systems, PhD Exposures: lives at home with husband.  Smoking history: passive smoke exposure in childhood  Social History   Occupational History   Occupation: retired    Comment: PhD; early childhood education  Tobacco Use   Smoking status: Never   Smokeless tobacco: Never  Vaping Use   Vaping Use: Never used  Substance and Sexual  Activity   Alcohol use: Never   Drug use: Never   Sexual activity: Not on file    Relevant family history:  Family History  Problem Relation Age of Onset   Tremor Mother    Depression Mother    Cancer Father        Lung Cancer   Tremor Maternal Grandmother    Diabetes Paternal Grandmother    Stroke Paternal Uncle    Healthy Child    Parkinson's disease Neg Hx     Past Medical History:  Diagnosis Date   Hypertension    Parkinson's disease    Sinus congestion    Thyroid disease     Past Surgical History:  Procedure Laterality Date   CATARACT EXTRACTION, BILATERAL     NASAL SINUS SURGERY     TUBAL LIGATION       Physical Exam: Blood pressure 134/74, pulse 98, temperature 98.4 F (36.9 C), temperature source Oral, height  (1.6 m), weight 131 lb 9.6 oz (59.7 kg), SpO2 99 %. Gen:      No acute distress, hoarse voice, stridor with quiet breathing ENT:  no nasal polyps, mucus membranes moist, no thyromegaly or palpable nodules Lungs:    inpspiratory stridor auscultated over the neck, hoarse voice, no wheeze, no increased wob CV:         Regular rate and rhythm; no murmurs, rubs, or gallops.  No pedal edema Abd:      + bowel sounds; soft, non-tender; no distension  MSK: no acute synovitis of DIP or PIP joints, no mechanics hands.  Skin:      Warm and dry; no rashes Neuro: normal speech, no focal facial asymmetry Psych: alert and oriented x3, normal mood and affect   Data Reviewed/Medical Decision Making:  Independent interpretation of tests: Imaging:  Review of patient's chest xray March 2024 images revealed no acute process. The patient's images have been independently reviewed by me.    PFTs:  Labs:  Lab Results  Component Value Date   NA 135 04/21/2022   K 3.6 04/21/2022   CO2 23 04/21/2022   GLUCOSE 116 (H) 04/21/2022   BUN 18 04/21/2022   CREATININE 0.58 04/21/2022   CALCIUM 8.8 (L) 04/21/2022   GFRNONAA >60 04/21/2022   Lab Results  Component  Value Date   WBC 5.7 04/21/2022   HGB 13.3 04/21/2022   HCT 39.0 04/21/2022   MCV 90.7 04/21/2022   PLT 196 04/21/2022     Immunization status:   There is no immunization history on file for this patient.   I reviewed prior external note(s) from ENT  I reviewed the result(s) of the labs and imaging as noted above.   I have ordered   Assessment:  Shortness of breath Stridor Vocal Cord Bowing   Plan/Recommendations:  Mrs. Mcginnis has symptoms of inspiratory stridor, shortness for of breath and difficulty breathing in which is classic for variable extrathoracic obstruction, possibly secondary to vocal cord abnormality. At this point I think PFTS are low yield and I advise her to follow up with her ENT physician next week as scheduled. I think her obstruction has substantially worsened in the last 6 months with post nasal drainage from allergies and sinus congestion.   She doesn't improve with steroids or inhalers. Asthma seems much less likely.  If her sleep doesn't improve with this, she may need repeat sleep study.  If her symptoms persist after addressing with ENT I would want her to come back for PFTs and further evaluation.   We discussed disease management and progression at length today.   I spent 45 minutes in the care of this patient today including pre-charting, chart review, review of results, face-to-face care, coordination of care and communication with consultants etc.).   Return to Care: Return if symptoms worsen or fail to improve.  Durel Salts, MD Pulmonary and Critical Care Medicine [facility] Office:[phone]  CC: Mardene Sayer, MD

## 2022-09-15 ENCOUNTER — Ambulatory Visit: Payer: Medicare PPO

## 2022-09-20 DIAGNOSIS — R490 Dysphonia: Secondary | ICD-10-CM | POA: Diagnosis not present

## 2022-09-21 DIAGNOSIS — J3802 Paralysis of vocal cords and larynx, bilateral: Secondary | ICD-10-CM | POA: Diagnosis not present

## 2022-09-21 DIAGNOSIS — R0602 Shortness of breath: Secondary | ICD-10-CM | POA: Diagnosis not present

## 2022-09-21 DIAGNOSIS — R490 Dysphonia: Secondary | ICD-10-CM | POA: Diagnosis not present

## 2022-09-22 ENCOUNTER — Ambulatory Visit: Payer: Medicare PPO | Admitting: Psychiatry

## 2022-09-24 DIAGNOSIS — G20A1 Parkinson's disease without dyskinesia, without mention of fluctuations: Secondary | ICD-10-CM | POA: Diagnosis not present

## 2022-09-24 DIAGNOSIS — R1310 Dysphagia, unspecified: Secondary | ICD-10-CM | POA: Diagnosis not present

## 2022-09-24 DIAGNOSIS — R490 Dysphonia: Secondary | ICD-10-CM | POA: Diagnosis not present

## 2022-10-06 ENCOUNTER — Ambulatory Visit: Payer: Medicare PPO

## 2022-10-06 NOTE — Therapy (Signed)
[place] [facility] [address] [place], [place], [address] Phone: [phone]   Fax:  [phone]  Patient Details  Name: Morgan Reese MRN: [id number] Date of Birth: Feb 01, 1951 Referring Provider:  Eustaquio Boyden, DO   Encounter Date: 10/06/2022  SPEECH THERAPY DISCHARGE SUMMARY  Visits from Start of Care: 5  Current functional level related to goals / functional outcomes: Pt had ENT appointment at [facility] and found bil vocal fold paralysis. Surgical options were discussed and pt likely to go with vocal fold injection sx. SLP to d/c pt from ST at this time. If further ST is necessary SLP encouraged pt to f/u with ST at [facility]. Orders currently for FEES but SLP told pt to inquire to ENT whether these orders are necessary. Pt to do so. Also ENT [facility] may desire pt to have instrumental swallow study done at [facility] and if so, pt could cx swallow evaluation in [place].  STG/LTG and impression from last attended ST session are below: ============================================ SHORT TERM GOALS: Target date: 09/08/22   Pt will complete PhoRTE exercises with rare min A in 2 sessions Baseline: Goal status: IN PROGRESS   2.  Pt will undergo clinical swallow assessment in first 3 sessions and objective swallow eval may be performed if clinically indicated; ST and LT goals added PRN Baseline:  Goal status: REVISED    LONG TERM GOALS: Target date: 10/13/22   Pt will complete PhoRTE exercises with modified independence in 3 sessions Baseline:  Goal status: IN PROGRESS   2.  Pt sustain /a/ measure will incr to average 4 seconds in 2 sessions without compensatory measures. Baseline:  Goal status: IN PROGRESS   3.  Pt's VHI and VR-QOL will improve compared to scores today, when administered in the last two ST sessions Baseline:  Goal status: IN PROGRESS    ASSESSMENT: CLINICAL  IMPRESSION: Patient is a 72 y.o. F who was seen today for decr'd voice quality after ENT evaluation in October 2023 identifying bowed vocal folds. Pt was dx'd with Parkinsonism in May 2023. SLP checked referrals and it does not appear a MBS or FEES was ordered/scheduled. SLP to follow up with this in next 12 hours. SLP suspects FEES may provide more diagnostic information given pt's dx of "bowed vocal folds"  SLP told pt to cont with SOVTE, and PHOrTE to optimize breath support and vocal quality.  Remaining deficits: Bil vocal fold paryalysis.   Education / Equipment: Vocal fold adduction exercises, need to perform swallow assessment.   Patient agrees to discharge. Patient goals were not met. Patient is being discharged due to a change in medical status.Marland Kitchen    Montgomery Rothlisberger, CCC-SLP 10/06/2022, 10:43 AM  [place] [facility] [address] [place], [place], [address] Phone: [phone]   Fax:  [phone]

## 2022-10-12 ENCOUNTER — Ambulatory Visit: Payer: Medicare PPO | Admitting: Neurology

## 2022-10-12 DIAGNOSIS — R490 Dysphonia: Secondary | ICD-10-CM | POA: Diagnosis not present

## 2022-10-12 DIAGNOSIS — J3802 Paralysis of vocal cords and larynx, bilateral: Secondary | ICD-10-CM | POA: Diagnosis not present

## 2022-10-12 DIAGNOSIS — R0602 Shortness of breath: Secondary | ICD-10-CM | POA: Diagnosis not present

## 2022-10-14 ENCOUNTER — Telehealth: Payer: Self-pay | Admitting: Adult Health

## 2022-10-14 NOTE — Telephone Encounter (Signed)
Patient called in for refill on Buspar 10mg . Ph: [phone] Appt 6/26 Pharmacy [facility] [phone] [place]

## 2022-10-14 NOTE — Telephone Encounter (Signed)
Patient has refills available. Notified her. She was wheezing during our conversation. I asked her if she was okay. She said she has bilateral vocal cord paralysis and may need a trach.

## 2022-10-19 ENCOUNTER — Ambulatory Visit: Payer: Medicare PPO | Admitting: Neurology

## 2022-10-27 DIAGNOSIS — G232 Striatonigral degeneration: Secondary | ICD-10-CM | POA: Diagnosis not present

## 2022-11-07 DIAGNOSIS — N3000 Acute cystitis without hematuria: Secondary | ICD-10-CM | POA: Diagnosis not present

## 2022-11-07 DIAGNOSIS — R300 Dysuria: Secondary | ICD-10-CM | POA: Diagnosis not present

## 2022-11-17 ENCOUNTER — Telehealth (INDEPENDENT_AMBULATORY_CARE_PROVIDER_SITE_OTHER): Payer: Medicare PPO | Admitting: Adult Health

## 2022-11-17 ENCOUNTER — Encounter: Payer: Self-pay | Admitting: Adult Health

## 2022-11-17 DIAGNOSIS — F411 Generalized anxiety disorder: Secondary | ICD-10-CM

## 2022-11-17 NOTE — Progress Notes (Signed)
Morgan Reese [id number] March 31, 1951 72 y.o.  Virtual Visit via Video Note  I connected with pt @ on 11/17/22 at  3:00 PM EDT by a video enabled telemedicine application and verified that I am speaking with the correct person using two identifiers.   I discussed the limitations of evaluation and management by telemedicine and the availability of in person appointments. The patient expressed understanding and agreed to proceed.  I discussed the assessment and treatment plan with the patient. The patient was provided an opportunity to ask questions and all were answered. The patient agreed with the plan and demonstrated an understanding of the instructions.   The patient was advised to call back or seek an in-person evaluation if the symptoms worsen or if the condition fails to improve as anticipated.  I provided 10 minutes of non-face-to-face time during this encounter.  The patient was located at home.  The provider was located at [facility].   Dorothyann Gibbs, NP   Subjective:   Patient ID:  Morgan Reese is a 72 y.o. (DOB Feb 22, 1951) female.  Chief Complaint: No chief complaint on file.   HPI JOEANNE ROBICHEAUX presents for follow-up of anxiety disorder.  Describes mood today as "better". Pleasant. Denies tearfulness. Mood symptoms - reports some depression, anxiety and irritability - "some version of all of it". Reports panic attacks. Reports some worry, rumination, and over thinking. Mood is variable - having good and bad days. Stating "I'm doing ok". Feels like medications are helpful. Improved interest and motivation. Taking medications as prescribed. Energy levels variable. Active, does not a regular exercise routine. Enjoys some usual interests and activities. Married - lives with husband. Has 2 adult children. Spending time with family.  Appetite adequate. Weight loss 125.6 from 130 pounds. Sleeps better some nights than others. Reports daytime napping after  7 dinner and up again at 8. Focus and concentration "ok - not as good as it was". Completing tasks. Managing aspects of household. Retired. Denies SI or HI.  Denies AH or VH. Denies self harm. Denies substance use. Denies paranoia.   Review of Systems:  Review of Systems  Musculoskeletal:  Negative for gait problem.  Neurological:  Negative for tremors.  Psychiatric/Behavioral:         Please refer to HPI    Medications: I have reviewed the patient's current medications.  Current Outpatient Medications  Medication Sig Dispense Refill   Ascorbic Acid (VITAMIN C) 1000 MG tablet Take 1,000 mg by mouth daily.     busPIRone (BUSPAR) 10 MG tablet Take 1 tablet (10 mg total) by mouth 3 (three) times daily. 270 tablet 3   Cholecalciferol (VITAMIN D3 PO) Take by mouth. Alternates 10,000 units with 5,000 units each day.     COPPER PO Take 30 mg by mouth 2 (two) times a week.     estradiol (ESTRACE) 0.1 MG/GM vaginal cream Place 1 Applicatorful vaginally at bedtime.     estradiol (VIVELLE-DOT) 0.0375 MG/24HR Place 1 patch onto the skin 2 (two) times a week.     gabapentin (NEURONTIN) 300 MG capsule Take 1 capsule (300 mg total) by mouth 3 (three) times daily. 270 capsule 3   levothyroxine (SYNTHROID) 50 MCG tablet Take 50 mcg by mouth daily before breakfast.     Magnesium Citrate 200 MG TABS Take 600 mg by mouth daily.     melatonin 3 MG TABS tablet Take 6-9 mg by mouth at bedtime.     NON FORMULARY Apply 2 %  topically daily.     Omega-3 Fatty Acids (FISH OIL) 1000 MG CAPS Take 1 capsule by mouth at bedtime.     progesterone (PROMETRIUM) 200 MG capsule Take 200 mg by mouth daily.     pyridOXINE (B-6) 50 MG tablet Take 50 mg by mouth daily.     rasagiline (AZILECT) 1 MG TABS tablet Take 1 tablet (1 mg total) by mouth daily. 90 tablet 1   thiamine 100 MG tablet Take 100 mg by mouth daily.     VITAMIN K PO Take by mouth daily. MK7     zinc gluconate 50 MG tablet Take 50 mg by mouth daily.      No current facility-administered medications for this visit.    Medication Side Effects: None  Allergies:  Allergies  Allergen Reactions   Amoxicillin Swelling    REACTION: Head felt over-heated Other reaction(s): Other (See Comments) REACTION: Head felt over-heated    Serotonin Reuptake Inhibitors (Ssris) Other (See Comments)    Other reaction(s): hyponatremia SSRI's are contraindicated, causes hyponatremia     Past Medical History:  Diagnosis Date   Hypertension    Parkinson's disease    Sinus congestion    Thyroid disease     Family History  Problem Relation Age of Onset   Tremor Mother    Depression Mother    Cancer Father        Lung Cancer   Tremor Maternal Grandmother    Diabetes Paternal Grandmother    Stroke Paternal Uncle    Healthy Child    Parkinson's disease Neg Hx     Social History   Socioeconomic History   Marital status: Married    Spouse name: Not on file   Number of children: 2   Years of education: Not on file   Highest education level: Not on file  Occupational History   Occupation: retired    Comment: PhD; early childhood education  Tobacco Use   Smoking status: Never   Smokeless tobacco: Never  Vaping Use   Vaping Use: Never used  Substance and Sexual Activity   Alcohol use: Never   Drug use: Never   Sexual activity: Not on file  Other Topics Concern   Not on file  Social History Narrative   Right handed   Caffeine: none    Lives in a two story home with main bedroom on first floor.    Social Determinants of Health   Financial Resource Strain: Not on file  Food Insecurity: Not on file  Transportation Needs: Not on file  Physical Activity: Not on file  Stress: Not on file  Social Connections: Not on file  Intimate Partner Violence: Not on file    Past Medical History, Surgical history, Social history, and Family history were reviewed and updated as appropriate.   Please see review of systems for further details  on the patient's review from today.   Objective:   Physical Exam:  There were no vitals taken for this visit.  Physical Exam Constitutional:      General: She is not in acute distress. Musculoskeletal:        General: No deformity.  Neurological:     Mental Status: She is alert and oriented to person, place, and time.     Coordination: Coordination normal.  Psychiatric:        Attention and Perception: Attention and perception normal. She does not perceive auditory or visual hallucinations.        Mood and Affect:  Mood normal. Mood is not anxious or depressed. Affect is not labile, blunt, angry or inappropriate.        Speech: Speech normal.        Behavior: Behavior normal.        Thought Content: Thought content normal. Thought content is not paranoid or delusional. Thought content does not include homicidal or suicidal ideation. Thought content does not include homicidal or suicidal plan.        Cognition and Memory: Cognition and memory normal.        Judgment: Judgment normal.     Comments: Insight intact     Lab Review:     Component Value Date/Time   NA 135 04/21/2022 1636   K 3.6 04/21/2022 1636   CL 100 04/21/2022 1410   CO2 23 04/21/2022 1410   GLUCOSE 116 (H) 04/21/2022 1410   BUN 18 04/21/2022 1410   CREATININE 0.58 04/21/2022 1410   CALCIUM 8.8 (L) 04/21/2022 1410   PROT 6.2 (L) 04/21/2022 1410   ALBUMIN 4.0 04/21/2022 1410   AST 25 04/21/2022 1410   ALT 29 04/21/2022 1410   ALKPHOS 59 04/21/2022 1410   BILITOT 0.5 04/21/2022 1410   GFRNONAA >60 04/21/2022 1410       Component Value Date/Time   WBC 5.7 04/21/2022 1410   RBC 4.32 04/21/2022 1410   HGB 13.3 04/21/2022 1636   HCT 39.0 04/21/2022 1636   PLT 196 04/21/2022 1410   MCV 90.7 04/21/2022 1410   MCH 30.6 04/21/2022 1410   MCHC 33.7 04/21/2022 1410   RDW 12.6 04/21/2022 1410   LYMPHSABS 1.1 04/21/2022 1410   MONOABS 0.4 04/21/2022 1410   EOSABS 0.1 04/21/2022 1410   BASOSABS 0.1  04/21/2022 1410    No results found for: "POCLITH", "LITHIUM"   No results found for: "PHENYTOIN", "PHENOBARB", "VALPROATE", "CBMZ"   .res Assessment: Plan:    Plan:  Planning to meet with neurologist at [facility] on December 10, 2022 - rule out Parkinson's.   Buspar 10mg  - three times daily for anxiety  Gabapentin 300mg  TID - anxiety  PDMP reviewed  Upcoming appt with pain management - [place] [facility]  Discussed potential benefits, risk, and side effects of benzodiazepines to include potential risk of tolerance and dependence, as well as possible drowsiness. Advised patient not to drive if experiencing drowsiness and to take lowest possible effective dose to minimize risk of dependence and tolerance.   Time spent with patient was 20 minutes. Greater than 50% of face to face time with patient was spent on counseling and coordination of care.    RTC 3 months   Diagnoses and all orders for this visit:  Generalized anxiety disorder     Please see After Visit Summary for patient specific instructions.  No future appointments.   No orders of the defined types were placed in this encounter.     -------------------------------

## 2022-11-29 DIAGNOSIS — M79671 Pain in right foot: Secondary | ICD-10-CM | POA: Diagnosis not present

## 2022-11-29 DIAGNOSIS — Z6831 Body mass index (BMI) 31.0-31.9, adult: Secondary | ICD-10-CM | POA: Diagnosis not present

## 2022-11-29 DIAGNOSIS — E871 Hypo-osmolality and hyponatremia: Secondary | ICD-10-CM | POA: Diagnosis not present

## 2022-11-29 DIAGNOSIS — R2681 Unsteadiness on feet: Secondary | ICD-10-CM | POA: Diagnosis not present

## 2022-11-29 DIAGNOSIS — B351 Tinea unguium: Secondary | ICD-10-CM | POA: Diagnosis not present

## 2022-12-08 DIAGNOSIS — M8580 Other specified disorders of bone density and structure, unspecified site: Secondary | ICD-10-CM | POA: Diagnosis not present

## 2022-12-08 DIAGNOSIS — B351 Tinea unguium: Secondary | ICD-10-CM | POA: Diagnosis not present

## 2022-12-08 DIAGNOSIS — E038 Other specified hypothyroidism: Secondary | ICD-10-CM | POA: Diagnosis not present

## 2022-12-08 DIAGNOSIS — G20A1 Parkinson's disease without dyskinesia, without mention of fluctuations: Secondary | ICD-10-CM | POA: Diagnosis not present

## 2022-12-08 DIAGNOSIS — F411 Generalized anxiety disorder: Secondary | ICD-10-CM | POA: Diagnosis not present

## 2022-12-08 DIAGNOSIS — E785 Hyperlipidemia, unspecified: Secondary | ICD-10-CM | POA: Diagnosis not present

## 2022-12-08 DIAGNOSIS — E559 Vitamin D deficiency, unspecified: Secondary | ICD-10-CM | POA: Diagnosis not present

## 2022-12-08 DIAGNOSIS — K219 Gastro-esophageal reflux disease without esophagitis: Secondary | ICD-10-CM | POA: Diagnosis not present

## 2022-12-08 DIAGNOSIS — M79671 Pain in right foot: Secondary | ICD-10-CM | POA: Diagnosis not present

## 2022-12-10 DIAGNOSIS — G9389 Other specified disorders of brain: Secondary | ICD-10-CM | POA: Diagnosis not present

## 2022-12-10 DIAGNOSIS — G912 (Idiopathic) normal pressure hydrocephalus: Secondary | ICD-10-CM | POA: Diagnosis not present

## 2022-12-16 DIAGNOSIS — E559 Vitamin D deficiency, unspecified: Secondary | ICD-10-CM | POA: Diagnosis not present

## 2022-12-16 DIAGNOSIS — B351 Tinea unguium: Secondary | ICD-10-CM | POA: Diagnosis not present

## 2022-12-16 DIAGNOSIS — K219 Gastro-esophageal reflux disease without esophagitis: Secondary | ICD-10-CM | POA: Diagnosis not present

## 2022-12-16 DIAGNOSIS — E785 Hyperlipidemia, unspecified: Secondary | ICD-10-CM | POA: Diagnosis not present

## 2022-12-16 DIAGNOSIS — M8580 Other specified disorders of bone density and structure, unspecified site: Secondary | ICD-10-CM | POA: Diagnosis not present

## 2022-12-16 DIAGNOSIS — M79671 Pain in right foot: Secondary | ICD-10-CM | POA: Diagnosis not present

## 2022-12-16 DIAGNOSIS — E038 Other specified hypothyroidism: Secondary | ICD-10-CM | POA: Diagnosis not present

## 2022-12-16 DIAGNOSIS — G20A1 Parkinson's disease without dyskinesia, without mention of fluctuations: Secondary | ICD-10-CM | POA: Diagnosis not present

## 2022-12-16 DIAGNOSIS — F411 Generalized anxiety disorder: Secondary | ICD-10-CM | POA: Diagnosis not present

## 2022-12-23 DIAGNOSIS — E038 Other specified hypothyroidism: Secondary | ICD-10-CM | POA: Diagnosis not present

## 2022-12-23 DIAGNOSIS — F411 Generalized anxiety disorder: Secondary | ICD-10-CM | POA: Diagnosis not present

## 2022-12-23 DIAGNOSIS — M8580 Other specified disorders of bone density and structure, unspecified site: Secondary | ICD-10-CM | POA: Diagnosis not present

## 2022-12-23 DIAGNOSIS — K219 Gastro-esophageal reflux disease without esophagitis: Secondary | ICD-10-CM | POA: Diagnosis not present

## 2022-12-23 DIAGNOSIS — E785 Hyperlipidemia, unspecified: Secondary | ICD-10-CM | POA: Diagnosis not present

## 2022-12-23 DIAGNOSIS — M79671 Pain in right foot: Secondary | ICD-10-CM | POA: Diagnosis not present

## 2022-12-23 DIAGNOSIS — E559 Vitamin D deficiency, unspecified: Secondary | ICD-10-CM | POA: Diagnosis not present

## 2022-12-23 DIAGNOSIS — B351 Tinea unguium: Secondary | ICD-10-CM | POA: Diagnosis not present

## 2022-12-23 DIAGNOSIS — G20A1 Parkinson's disease without dyskinesia, without mention of fluctuations: Secondary | ICD-10-CM | POA: Diagnosis not present

## 2022-12-28 DIAGNOSIS — E559 Vitamin D deficiency, unspecified: Secondary | ICD-10-CM | POA: Diagnosis not present

## 2022-12-28 DIAGNOSIS — B351 Tinea unguium: Secondary | ICD-10-CM | POA: Diagnosis not present

## 2022-12-28 DIAGNOSIS — K219 Gastro-esophageal reflux disease without esophagitis: Secondary | ICD-10-CM | POA: Diagnosis not present

## 2022-12-28 DIAGNOSIS — E785 Hyperlipidemia, unspecified: Secondary | ICD-10-CM | POA: Diagnosis not present

## 2022-12-28 DIAGNOSIS — E038 Other specified hypothyroidism: Secondary | ICD-10-CM | POA: Diagnosis not present

## 2022-12-28 DIAGNOSIS — M8580 Other specified disorders of bone density and structure, unspecified site: Secondary | ICD-10-CM | POA: Diagnosis not present

## 2022-12-28 DIAGNOSIS — F411 Generalized anxiety disorder: Secondary | ICD-10-CM | POA: Diagnosis not present

## 2022-12-28 DIAGNOSIS — G20A1 Parkinson's disease without dyskinesia, without mention of fluctuations: Secondary | ICD-10-CM | POA: Diagnosis not present

## 2022-12-28 DIAGNOSIS — M79671 Pain in right foot: Secondary | ICD-10-CM | POA: Diagnosis not present

## 2022-12-31 DIAGNOSIS — G232 Striatonigral degeneration: Secondary | ICD-10-CM | POA: Diagnosis not present

## 2023-01-03 DIAGNOSIS — E039 Hypothyroidism, unspecified: Secondary | ICD-10-CM | POA: Diagnosis not present

## 2023-01-03 DIAGNOSIS — N898 Other specified noninflammatory disorders of vagina: Secondary | ICD-10-CM | POA: Diagnosis not present

## 2023-01-03 DIAGNOSIS — R399 Unspecified symptoms and signs involving the genitourinary system: Secondary | ICD-10-CM | POA: Diagnosis not present

## 2023-01-03 DIAGNOSIS — Z6821 Body mass index (BMI) 21.0-21.9, adult: Secondary | ICD-10-CM | POA: Diagnosis not present

## 2023-01-04 DIAGNOSIS — K219 Gastro-esophageal reflux disease without esophagitis: Secondary | ICD-10-CM | POA: Diagnosis not present

## 2023-01-04 DIAGNOSIS — M79671 Pain in right foot: Secondary | ICD-10-CM | POA: Diagnosis not present

## 2023-01-04 DIAGNOSIS — G20A1 Parkinson's disease without dyskinesia, without mention of fluctuations: Secondary | ICD-10-CM | POA: Diagnosis not present

## 2023-01-04 DIAGNOSIS — M8580 Other specified disorders of bone density and structure, unspecified site: Secondary | ICD-10-CM | POA: Diagnosis not present

## 2023-01-04 DIAGNOSIS — E038 Other specified hypothyroidism: Secondary | ICD-10-CM | POA: Diagnosis not present

## 2023-01-04 DIAGNOSIS — F411 Generalized anxiety disorder: Secondary | ICD-10-CM | POA: Diagnosis not present

## 2023-01-04 DIAGNOSIS — B351 Tinea unguium: Secondary | ICD-10-CM | POA: Diagnosis not present

## 2023-01-04 DIAGNOSIS — E785 Hyperlipidemia, unspecified: Secondary | ICD-10-CM | POA: Diagnosis not present

## 2023-01-04 DIAGNOSIS — E559 Vitamin D deficiency, unspecified: Secondary | ICD-10-CM | POA: Diagnosis not present

## 2023-01-07 DIAGNOSIS — M8580 Other specified disorders of bone density and structure, unspecified site: Secondary | ICD-10-CM | POA: Diagnosis not present

## 2023-01-07 DIAGNOSIS — F411 Generalized anxiety disorder: Secondary | ICD-10-CM | POA: Diagnosis not present

## 2023-01-07 DIAGNOSIS — G20A1 Parkinson's disease without dyskinesia, without mention of fluctuations: Secondary | ICD-10-CM | POA: Diagnosis not present

## 2023-01-07 DIAGNOSIS — M79671 Pain in right foot: Secondary | ICD-10-CM | POA: Diagnosis not present

## 2023-01-07 DIAGNOSIS — E038 Other specified hypothyroidism: Secondary | ICD-10-CM | POA: Diagnosis not present

## 2023-01-07 DIAGNOSIS — B351 Tinea unguium: Secondary | ICD-10-CM | POA: Diagnosis not present

## 2023-01-07 DIAGNOSIS — E785 Hyperlipidemia, unspecified: Secondary | ICD-10-CM | POA: Diagnosis not present

## 2023-01-07 DIAGNOSIS — K219 Gastro-esophageal reflux disease without esophagitis: Secondary | ICD-10-CM | POA: Diagnosis not present

## 2023-01-07 DIAGNOSIS — E559 Vitamin D deficiency, unspecified: Secondary | ICD-10-CM | POA: Diagnosis not present

## 2023-01-08 DIAGNOSIS — R002 Palpitations: Secondary | ICD-10-CM | POA: Diagnosis not present

## 2023-01-08 DIAGNOSIS — R061 Stridor: Secondary | ICD-10-CM | POA: Diagnosis not present

## 2023-01-10 DIAGNOSIS — E785 Hyperlipidemia, unspecified: Secondary | ICD-10-CM | POA: Diagnosis not present

## 2023-01-10 DIAGNOSIS — M79671 Pain in right foot: Secondary | ICD-10-CM | POA: Diagnosis not present

## 2023-01-10 DIAGNOSIS — F411 Generalized anxiety disorder: Secondary | ICD-10-CM | POA: Diagnosis not present

## 2023-01-10 DIAGNOSIS — G20A1 Parkinson's disease without dyskinesia, without mention of fluctuations: Secondary | ICD-10-CM | POA: Diagnosis not present

## 2023-01-10 DIAGNOSIS — B351 Tinea unguium: Secondary | ICD-10-CM | POA: Diagnosis not present

## 2023-01-10 DIAGNOSIS — M8580 Other specified disorders of bone density and structure, unspecified site: Secondary | ICD-10-CM | POA: Diagnosis not present

## 2023-01-10 DIAGNOSIS — E559 Vitamin D deficiency, unspecified: Secondary | ICD-10-CM | POA: Diagnosis not present

## 2023-01-10 DIAGNOSIS — E038 Other specified hypothyroidism: Secondary | ICD-10-CM | POA: Diagnosis not present

## 2023-01-10 DIAGNOSIS — K219 Gastro-esophageal reflux disease without esophagitis: Secondary | ICD-10-CM | POA: Diagnosis not present

## 2023-01-13 DIAGNOSIS — G9389 Other specified disorders of brain: Secondary | ICD-10-CM | POA: Diagnosis not present

## 2023-01-13 DIAGNOSIS — M21372 Foot drop, left foot: Secondary | ICD-10-CM | POA: Diagnosis not present

## 2023-01-19 DIAGNOSIS — M8580 Other specified disorders of bone density and structure, unspecified site: Secondary | ICD-10-CM | POA: Diagnosis not present

## 2023-01-19 DIAGNOSIS — E559 Vitamin D deficiency, unspecified: Secondary | ICD-10-CM | POA: Diagnosis not present

## 2023-01-19 DIAGNOSIS — E785 Hyperlipidemia, unspecified: Secondary | ICD-10-CM | POA: Diagnosis not present

## 2023-01-19 DIAGNOSIS — M79671 Pain in right foot: Secondary | ICD-10-CM | POA: Diagnosis not present

## 2023-01-19 DIAGNOSIS — K219 Gastro-esophageal reflux disease without esophagitis: Secondary | ICD-10-CM | POA: Diagnosis not present

## 2023-01-19 DIAGNOSIS — B351 Tinea unguium: Secondary | ICD-10-CM | POA: Diagnosis not present

## 2023-01-19 DIAGNOSIS — F411 Generalized anxiety disorder: Secondary | ICD-10-CM | POA: Diagnosis not present

## 2023-01-19 DIAGNOSIS — E038 Other specified hypothyroidism: Secondary | ICD-10-CM | POA: Diagnosis not present

## 2023-01-19 DIAGNOSIS — G20A1 Parkinson's disease without dyskinesia, without mention of fluctuations: Secondary | ICD-10-CM | POA: Diagnosis not present

## 2023-01-20 DIAGNOSIS — B351 Tinea unguium: Secondary | ICD-10-CM | POA: Diagnosis not present

## 2023-01-20 DIAGNOSIS — F411 Generalized anxiety disorder: Secondary | ICD-10-CM | POA: Diagnosis not present

## 2023-01-20 DIAGNOSIS — G20A1 Parkinson's disease without dyskinesia, without mention of fluctuations: Secondary | ICD-10-CM | POA: Diagnosis not present

## 2023-01-20 DIAGNOSIS — M8580 Other specified disorders of bone density and structure, unspecified site: Secondary | ICD-10-CM | POA: Diagnosis not present

## 2023-01-20 DIAGNOSIS — E038 Other specified hypothyroidism: Secondary | ICD-10-CM | POA: Diagnosis not present

## 2023-01-20 DIAGNOSIS — K219 Gastro-esophageal reflux disease without esophagitis: Secondary | ICD-10-CM | POA: Diagnosis not present

## 2023-01-20 DIAGNOSIS — E559 Vitamin D deficiency, unspecified: Secondary | ICD-10-CM | POA: Diagnosis not present

## 2023-01-20 DIAGNOSIS — E785 Hyperlipidemia, unspecified: Secondary | ICD-10-CM | POA: Diagnosis not present

## 2023-01-20 DIAGNOSIS — M79671 Pain in right foot: Secondary | ICD-10-CM | POA: Diagnosis not present

## 2023-01-27 DIAGNOSIS — I77810 Thoracic aortic ectasia: Secondary | ICD-10-CM | POA: Diagnosis not present

## 2023-01-27 DIAGNOSIS — I5189 Other ill-defined heart diseases: Secondary | ICD-10-CM | POA: Diagnosis not present

## 2023-01-27 DIAGNOSIS — I700 Atherosclerosis of aorta: Secondary | ICD-10-CM | POA: Diagnosis not present

## 2023-01-27 DIAGNOSIS — F411 Generalized anxiety disorder: Secondary | ICD-10-CM | POA: Diagnosis not present

## 2023-01-27 DIAGNOSIS — E039 Hypothyroidism, unspecified: Secondary | ICD-10-CM | POA: Diagnosis not present

## 2023-01-27 DIAGNOSIS — G20A1 Parkinson's disease without dyskinesia, without mention of fluctuations: Secondary | ICD-10-CM | POA: Diagnosis not present

## 2023-01-27 DIAGNOSIS — Z6822 Body mass index (BMI) 22.0-22.9, adult: Secondary | ICD-10-CM | POA: Diagnosis not present

## 2023-01-27 DIAGNOSIS — R490 Dysphonia: Secondary | ICD-10-CM | POA: Diagnosis not present

## 2023-01-27 DIAGNOSIS — R2681 Unsteadiness on feet: Secondary | ICD-10-CM | POA: Diagnosis not present

## 2023-01-28 DIAGNOSIS — M79671 Pain in right foot: Secondary | ICD-10-CM | POA: Diagnosis not present

## 2023-01-28 DIAGNOSIS — B351 Tinea unguium: Secondary | ICD-10-CM | POA: Diagnosis not present

## 2023-01-28 DIAGNOSIS — F411 Generalized anxiety disorder: Secondary | ICD-10-CM | POA: Diagnosis not present

## 2023-01-28 DIAGNOSIS — E785 Hyperlipidemia, unspecified: Secondary | ICD-10-CM | POA: Diagnosis not present

## 2023-01-28 DIAGNOSIS — E559 Vitamin D deficiency, unspecified: Secondary | ICD-10-CM | POA: Diagnosis not present

## 2023-01-28 DIAGNOSIS — K219 Gastro-esophageal reflux disease without esophagitis: Secondary | ICD-10-CM | POA: Diagnosis not present

## 2023-01-28 DIAGNOSIS — E038 Other specified hypothyroidism: Secondary | ICD-10-CM | POA: Diagnosis not present

## 2023-01-28 DIAGNOSIS — M8580 Other specified disorders of bone density and structure, unspecified site: Secondary | ICD-10-CM | POA: Diagnosis not present

## 2023-01-28 DIAGNOSIS — G20A1 Parkinson's disease without dyskinesia, without mention of fluctuations: Secondary | ICD-10-CM | POA: Diagnosis not present

## 2023-02-04 DIAGNOSIS — M79671 Pain in right foot: Secondary | ICD-10-CM | POA: Diagnosis not present

## 2023-02-04 DIAGNOSIS — M8580 Other specified disorders of bone density and structure, unspecified site: Secondary | ICD-10-CM | POA: Diagnosis not present

## 2023-02-04 DIAGNOSIS — E785 Hyperlipidemia, unspecified: Secondary | ICD-10-CM | POA: Diagnosis not present

## 2023-02-04 DIAGNOSIS — K219 Gastro-esophageal reflux disease without esophagitis: Secondary | ICD-10-CM | POA: Diagnosis not present

## 2023-02-04 DIAGNOSIS — F411 Generalized anxiety disorder: Secondary | ICD-10-CM | POA: Diagnosis not present

## 2023-02-04 DIAGNOSIS — E559 Vitamin D deficiency, unspecified: Secondary | ICD-10-CM | POA: Diagnosis not present

## 2023-02-04 DIAGNOSIS — G20A1 Parkinson's disease without dyskinesia, without mention of fluctuations: Secondary | ICD-10-CM | POA: Diagnosis not present

## 2023-02-04 DIAGNOSIS — B351 Tinea unguium: Secondary | ICD-10-CM | POA: Diagnosis not present

## 2023-02-04 DIAGNOSIS — E038 Other specified hypothyroidism: Secondary | ICD-10-CM | POA: Diagnosis not present

## 2023-02-06 DIAGNOSIS — E038 Other specified hypothyroidism: Secondary | ICD-10-CM | POA: Diagnosis not present

## 2023-02-06 DIAGNOSIS — I700 Atherosclerosis of aorta: Secondary | ICD-10-CM | POA: Diagnosis not present

## 2023-02-06 DIAGNOSIS — M8580 Other specified disorders of bone density and structure, unspecified site: Secondary | ICD-10-CM | POA: Diagnosis not present

## 2023-02-06 DIAGNOSIS — E559 Vitamin D deficiency, unspecified: Secondary | ICD-10-CM | POA: Diagnosis not present

## 2023-02-06 DIAGNOSIS — K219 Gastro-esophageal reflux disease without esophagitis: Secondary | ICD-10-CM | POA: Diagnosis not present

## 2023-02-06 DIAGNOSIS — G252 Other specified forms of tremor: Secondary | ICD-10-CM | POA: Diagnosis not present

## 2023-02-06 DIAGNOSIS — F411 Generalized anxiety disorder: Secondary | ICD-10-CM | POA: Diagnosis not present

## 2023-02-06 DIAGNOSIS — E785 Hyperlipidemia, unspecified: Secondary | ICD-10-CM | POA: Diagnosis not present

## 2023-02-06 DIAGNOSIS — G20A1 Parkinson's disease without dyskinesia, without mention of fluctuations: Secondary | ICD-10-CM | POA: Diagnosis not present

## 2023-02-10 DIAGNOSIS — G20A1 Parkinson's disease without dyskinesia, without mention of fluctuations: Secondary | ICD-10-CM | POA: Diagnosis not present

## 2023-02-10 DIAGNOSIS — E559 Vitamin D deficiency, unspecified: Secondary | ICD-10-CM | POA: Diagnosis not present

## 2023-02-10 DIAGNOSIS — G252 Other specified forms of tremor: Secondary | ICD-10-CM | POA: Diagnosis not present

## 2023-02-10 DIAGNOSIS — I700 Atherosclerosis of aorta: Secondary | ICD-10-CM | POA: Diagnosis not present

## 2023-02-10 DIAGNOSIS — E785 Hyperlipidemia, unspecified: Secondary | ICD-10-CM | POA: Diagnosis not present

## 2023-02-10 DIAGNOSIS — M8580 Other specified disorders of bone density and structure, unspecified site: Secondary | ICD-10-CM | POA: Diagnosis not present

## 2023-02-10 DIAGNOSIS — E038 Other specified hypothyroidism: Secondary | ICD-10-CM | POA: Diagnosis not present

## 2023-02-10 DIAGNOSIS — K219 Gastro-esophageal reflux disease without esophagitis: Secondary | ICD-10-CM | POA: Diagnosis not present

## 2023-02-10 DIAGNOSIS — F411 Generalized anxiety disorder: Secondary | ICD-10-CM | POA: Diagnosis not present

## 2023-02-15 ENCOUNTER — Encounter: Payer: Self-pay | Admitting: Adult Health

## 2023-02-15 ENCOUNTER — Telehealth: Payer: Medicare PPO | Admitting: Adult Health

## 2023-02-15 DIAGNOSIS — F411 Generalized anxiety disorder: Secondary | ICD-10-CM

## 2023-02-15 DIAGNOSIS — R4589 Other symptoms and signs involving emotional state: Secondary | ICD-10-CM

## 2023-02-15 NOTE — Progress Notes (Signed)
Morgan Reese [id number] 01/05/1951 72 y.o.  Virtual Visit via Video Note  I connected with pt @ on 02/15/23 at  3:00 PM EDT by a video enabled telemedicine application and verified that I am speaking with the correct person using two identifiers.   I discussed the limitations of evaluation and management by telemedicine and the availability of in person appointments. The patient expressed understanding and agreed to proceed.  I discussed the assessment and treatment plan with the patient. The patient was provided an opportunity to ask questions and all were answered. The patient agreed with the plan and demonstrated an understanding of the instructions.   The patient was advised to call back or seek an in-person evaluation if the symptoms worsen or if the condition fails to improve as anticipated.  I provided 10 minutes of non-face-to-face time during this encounter.  The patient was located at home.  The provider was located at [facility].   Dorothyann Gibbs, NP   Subjective:   Patient ID:  Morgan Reese is a 72 y.o. (DOB Aug 08, 1950) female.  Chief Complaint: No chief complaint on file.   HPI Morgan Reese presents for follow-up of anxiety disorder.  Accompanied by husband to help with clarification.  Describes mood today as "better". Pleasant. Denies tearfulness. Mood symptoms - reports some depression, anxiety and irritability. Reports panic attacks. Reports worry, rumination, and over thinking. Mood is variable. Stating "I'm having good days and better days.". Feels like medications are helpful. Improved interest and motivation. Taking medications as prescribed. Energy levels variable. Active, does not a regular exercise routine. Enjoys some usual interests and activities. Married - lives with husband. Has 2 adult children. Spending time with family.  Appetite adequate. Weight stable - 125 pounds. Sleeps better some nights than others.  Focus and  concentration varies. Completing tasks. Managing aspects of household. Retired. Denies SI or HI.  Denies AH or VH. Denies self harm. Denies substance use. Denies paranoia.   Review of Systems:  Review of Systems  Musculoskeletal:  Negative for gait problem.  Neurological:  Negative for tremors.  Psychiatric/Behavioral:         Please refer to HPI    Medications: I have reviewed the patient's current medications.  Current Outpatient Medications  Medication Sig Dispense Refill   Ascorbic Acid (VITAMIN C) 1000 MG tablet Take 1,000 mg by mouth daily.     busPIRone (BUSPAR) 10 MG tablet Take 1 tablet (10 mg total) by mouth 3 (three) times daily. 270 tablet 3   Cholecalciferol (VITAMIN D3 PO) Take by mouth. Alternates 10,000 units with 5,000 units each day.     COPPER PO Take 30 mg by mouth 2 (two) times a week.     estradiol (ESTRACE) 0.1 MG/GM vaginal cream Place 1 Applicatorful vaginally at bedtime.     estradiol (VIVELLE-DOT) 0.0375 MG/24HR Place 1 patch onto the skin 2 (two) times a week.     gabapentin (NEURONTIN) 300 MG capsule Take 1 capsule (300 mg total) by mouth 3 (three) times daily. 270 capsule 3   levothyroxine (SYNTHROID) 50 MCG tablet Take 50 mcg by mouth daily before breakfast.     Magnesium Citrate 200 MG TABS Take 600 mg by mouth daily.     melatonin 3 MG TABS tablet Take 6-9 mg by mouth at bedtime.     NON FORMULARY Apply 2 % topically daily.     Omega-3 Fatty Acids (FISH OIL) 1000 MG CAPS Take 1 capsule by mouth at  bedtime.     progesterone (PROMETRIUM) 200 MG capsule Take 200 mg by mouth daily.     pyridOXINE (B-6) 50 MG tablet Take 50 mg by mouth daily.     rasagiline (AZILECT) 1 MG TABS tablet Take 1 tablet (1 mg total) by mouth daily. 90 tablet 1   thiamine 100 MG tablet Take 100 mg by mouth daily.     VITAMIN K PO Take by mouth daily. MK7     zinc gluconate 50 MG tablet Take 50 mg by mouth daily.     No current facility-administered medications for this  visit.    Medication Side Effects: None  Allergies:  Allergies  Allergen Reactions   Amoxicillin Swelling    REACTION: Head felt over-heated Other reaction(s): Other (See Comments) REACTION: Head felt over-heated    Serotonin Reuptake Inhibitors (Ssris) Other (See Comments)    Other reaction(s): hyponatremia SSRI's are contraindicated, causes hyponatremia     Past Medical History:  Diagnosis Date   Hypertension    Parkinson's disease    Sinus congestion    Thyroid disease     Family History  Problem Relation Age of Onset   Tremor Mother    Depression Mother    Cancer Father        Lung Cancer   Tremor Maternal Grandmother    Diabetes Paternal Grandmother    Stroke Paternal Uncle    Healthy Child    Parkinson's disease Neg Hx     Social History   Socioeconomic History   Marital status: Married    Spouse name: Not on file   Number of children: 2   Years of education: Not on file   Highest education level: Not on file  Occupational History   Occupation: retired    Comment: PhD; early childhood education  Tobacco Use   Smoking status: Never   Smokeless tobacco: Never  Vaping Use   Vaping status: Never Used  Substance and Sexual Activity   Alcohol use: Never   Drug use: Never   Sexual activity: Not on file  Other Topics Concern   Not on file  Social History Narrative   Right handed   Caffeine: none    Lives in a two story home with main bedroom on first floor.    Social Determinants of Health   Financial Resource Strain: Not on file  Food Insecurity: No Food Insecurity (04/30/2022)   Received from [facility], [facility]   Hunger Vital Sign    Worried About Running Out of Food in the Last Year: Never true    Ran Out of Food in the Last Year: Never true  Transportation Needs: Not on file  Physical Activity: Not on file  Stress: Not on file  Social Connections: Unknown (04/06/2022)   Received from [facility], [facility]   Social  Network    Social Network: Not on file  Intimate Partner Violence: Unknown (04/06/2022)   Received from [employer], [facility]   HITS    Physically Hurt: Not on file    Insult or Talk Down To: Not on file    Threaten Physical Harm: Not on file    Scream or Curse: Not on file    Past Medical History, Surgical history, Social history, and Family history were reviewed and updated as appropriate.   Please see review of systems for further details on the patient's review from today.   Objective:   Physical Exam:  There were no vitals taken for  this visit.  Physical Exam Constitutional:      General: She is not in acute distress. Musculoskeletal:        General: No deformity.  Neurological:     Mental Status: She is alert and oriented to person, place, and time.     Coordination: Coordination normal.  Psychiatric:        Attention and Perception: Attention and perception normal. She does not perceive auditory or visual hallucinations.        Mood and Affect: Affect is not labile, blunt, angry or inappropriate.        Speech: Speech normal.        Behavior: Behavior normal.        Thought Content: Thought content normal. Thought content is not paranoid or delusional. Thought content does not include homicidal or suicidal ideation. Thought content does not include homicidal or suicidal plan.        Cognition and Memory: Cognition and memory normal.        Judgment: Judgment normal.     Comments: Insight intact     Lab Review:     Component Value Date/Time   NA 135 04/21/2022 1636   K 3.6 04/21/2022 1636   CL 100 04/21/2022 1410   CO2 23 04/21/2022 1410   GLUCOSE 116 (H) 04/21/2022 1410   BUN 18 04/21/2022 1410   CREATININE 0.58 04/21/2022 1410   CALCIUM 8.8 (L) 04/21/2022 1410   PROT 6.2 (L) 04/21/2022 1410   ALBUMIN 4.0 04/21/2022 1410   AST 25 04/21/2022 1410   ALT 29 04/21/2022 1410   ALKPHOS 59 04/21/2022 1410   BILITOT 0.5 04/21/2022 1410   GFRNONAA >60  04/21/2022 1410       Component Value Date/Time   WBC 5.7 04/21/2022 1410   RBC 4.32 04/21/2022 1410   HGB 13.3 04/21/2022 1636   HCT 39.0 04/21/2022 1636   PLT 196 04/21/2022 1410   MCV 90.7 04/21/2022 1410   MCH 30.6 04/21/2022 1410   MCHC 33.7 04/21/2022 1410   RDW 12.6 04/21/2022 1410   LYMPHSABS 1.1 04/21/2022 1410   MONOABS 0.4 04/21/2022 1410   EOSABS 0.1 04/21/2022 1410   BASOSABS 0.1 04/21/2022 1410    No results found for: "POCLITH", "LITHIUM"   No results found for: "PHENYTOIN", "PHENOBARB", "VALPROATE", "CBMZ"   .res Assessment: Plan:    Plan:  Working with [facility] neurologist   Buspar 10mg  - three times daily for anxiety  Gabapentin 300mg  TID - anxiety  PDMP reviewed  Discussed potential benefits, risk, and side effects of benzodiazepines to include potential risk of tolerance and dependence, as well as possible drowsiness. Advised patient not to drive if experiencing drowsiness and to take lowest possible effective dose to minimize risk of dependence and tolerance.   Time spent with patient was 10 minutes. Greater than 50% of face to face time with patient was spent on counseling and coordination of care.    RTC 3 months  There are no diagnoses linked to this encounter.   Please see After Visit Summary for patient specific instructions.  Future Appointments  Date Time Provider Department Center  02/15/2023  3:00 PM Devaunte Gasparini, Thereasa Solo, NP CP-CP None    No orders of the defined types were placed in this encounter.     -------------------------------

## 2023-02-16 DIAGNOSIS — I700 Atherosclerosis of aorta: Secondary | ICD-10-CM | POA: Diagnosis not present

## 2023-02-16 DIAGNOSIS — M8580 Other specified disorders of bone density and structure, unspecified site: Secondary | ICD-10-CM | POA: Diagnosis not present

## 2023-02-16 DIAGNOSIS — G252 Other specified forms of tremor: Secondary | ICD-10-CM | POA: Diagnosis not present

## 2023-02-16 DIAGNOSIS — E038 Other specified hypothyroidism: Secondary | ICD-10-CM | POA: Diagnosis not present

## 2023-02-16 DIAGNOSIS — K219 Gastro-esophageal reflux disease without esophagitis: Secondary | ICD-10-CM | POA: Diagnosis not present

## 2023-02-16 DIAGNOSIS — E559 Vitamin D deficiency, unspecified: Secondary | ICD-10-CM | POA: Diagnosis not present

## 2023-02-16 DIAGNOSIS — E785 Hyperlipidemia, unspecified: Secondary | ICD-10-CM | POA: Diagnosis not present

## 2023-02-16 DIAGNOSIS — F411 Generalized anxiety disorder: Secondary | ICD-10-CM | POA: Diagnosis not present

## 2023-02-16 DIAGNOSIS — G20A1 Parkinson's disease without dyskinesia, without mention of fluctuations: Secondary | ICD-10-CM | POA: Diagnosis not present

## 2023-02-17 DIAGNOSIS — G912 (Idiopathic) normal pressure hydrocephalus: Secondary | ICD-10-CM | POA: Diagnosis not present

## 2023-02-18 DIAGNOSIS — I700 Atherosclerosis of aorta: Secondary | ICD-10-CM | POA: Diagnosis not present

## 2023-02-18 DIAGNOSIS — F411 Generalized anxiety disorder: Secondary | ICD-10-CM | POA: Diagnosis not present

## 2023-02-18 DIAGNOSIS — E785 Hyperlipidemia, unspecified: Secondary | ICD-10-CM | POA: Diagnosis not present

## 2023-02-18 DIAGNOSIS — G252 Other specified forms of tremor: Secondary | ICD-10-CM | POA: Diagnosis not present

## 2023-02-18 DIAGNOSIS — G20A1 Parkinson's disease without dyskinesia, without mention of fluctuations: Secondary | ICD-10-CM | POA: Diagnosis not present

## 2023-02-18 DIAGNOSIS — M8580 Other specified disorders of bone density and structure, unspecified site: Secondary | ICD-10-CM | POA: Diagnosis not present

## 2023-02-18 DIAGNOSIS — E038 Other specified hypothyroidism: Secondary | ICD-10-CM | POA: Diagnosis not present

## 2023-02-18 DIAGNOSIS — E559 Vitamin D deficiency, unspecified: Secondary | ICD-10-CM | POA: Diagnosis not present

## 2023-02-18 DIAGNOSIS — K219 Gastro-esophageal reflux disease without esophagitis: Secondary | ICD-10-CM | POA: Diagnosis not present

## 2023-03-04 DIAGNOSIS — I700 Atherosclerosis of aorta: Secondary | ICD-10-CM | POA: Diagnosis not present

## 2023-03-04 DIAGNOSIS — E038 Other specified hypothyroidism: Secondary | ICD-10-CM | POA: Diagnosis not present

## 2023-03-04 DIAGNOSIS — K219 Gastro-esophageal reflux disease without esophagitis: Secondary | ICD-10-CM | POA: Diagnosis not present

## 2023-03-04 DIAGNOSIS — E559 Vitamin D deficiency, unspecified: Secondary | ICD-10-CM | POA: Diagnosis not present

## 2023-03-04 DIAGNOSIS — F411 Generalized anxiety disorder: Secondary | ICD-10-CM | POA: Diagnosis not present

## 2023-03-04 DIAGNOSIS — G20A1 Parkinson's disease without dyskinesia, without mention of fluctuations: Secondary | ICD-10-CM | POA: Diagnosis not present

## 2023-03-04 DIAGNOSIS — G20C Parkinsonism, unspecified: Secondary | ICD-10-CM | POA: Diagnosis not present

## 2023-03-04 DIAGNOSIS — M8580 Other specified disorders of bone density and structure, unspecified site: Secondary | ICD-10-CM | POA: Diagnosis not present

## 2023-03-04 DIAGNOSIS — G252 Other specified forms of tremor: Secondary | ICD-10-CM | POA: Diagnosis not present

## 2023-03-04 DIAGNOSIS — E785 Hyperlipidemia, unspecified: Secondary | ICD-10-CM | POA: Diagnosis not present

## 2023-03-23 DIAGNOSIS — R061 Stridor: Secondary | ICD-10-CM | POA: Diagnosis not present

## 2023-03-23 DIAGNOSIS — G20C Parkinsonism, unspecified: Secondary | ICD-10-CM | POA: Diagnosis not present

## 2023-04-06 ENCOUNTER — Encounter: Payer: Self-pay | Admitting: Psychiatry

## 2023-04-19 DIAGNOSIS — R0602 Shortness of breath: Secondary | ICD-10-CM | POA: Diagnosis not present

## 2023-04-19 DIAGNOSIS — R490 Dysphonia: Secondary | ICD-10-CM | POA: Diagnosis not present

## 2023-04-19 DIAGNOSIS — J3802 Paralysis of vocal cords and larynx, bilateral: Secondary | ICD-10-CM | POA: Diagnosis not present

## 2023-04-19 DIAGNOSIS — R1314 Dysphagia, pharyngoesophageal phase: Secondary | ICD-10-CM | POA: Diagnosis not present

## 2023-04-19 DIAGNOSIS — T17908A Unspecified foreign body in respiratory tract, part unspecified causing other injury, initial encounter: Secondary | ICD-10-CM | POA: Diagnosis not present

## 2023-04-25 DIAGNOSIS — R1313 Dysphagia, pharyngeal phase: Secondary | ICD-10-CM | POA: Diagnosis not present

## 2023-05-10 ENCOUNTER — Emergency Department (HOSPITAL_BASED_OUTPATIENT_CLINIC_OR_DEPARTMENT_OTHER): Payer: Medicare PPO

## 2023-05-10 ENCOUNTER — Emergency Department (HOSPITAL_BASED_OUTPATIENT_CLINIC_OR_DEPARTMENT_OTHER)
Admission: EM | Admit: 2023-05-10 | Discharge: 2023-05-10 | Disposition: A | Discharge status: Home / Self Care | Payer: Medicare PPO | Attending: Emergency Medicine | Admitting: Emergency Medicine

## 2023-05-10 ENCOUNTER — Other Ambulatory Visit: Payer: Self-pay

## 2023-05-10 ENCOUNTER — Encounter (HOSPITAL_BASED_OUTPATIENT_CLINIC_OR_DEPARTMENT_OTHER): Payer: Self-pay | Admitting: Emergency Medicine

## 2023-05-10 DIAGNOSIS — I10 Essential (primary) hypertension: Secondary | ICD-10-CM | POA: Insufficient documentation

## 2023-05-10 DIAGNOSIS — H2513 Age-related nuclear cataract, bilateral: Secondary | ICD-10-CM | POA: Diagnosis not present

## 2023-05-10 DIAGNOSIS — H524 Presbyopia: Secondary | ICD-10-CM | POA: Diagnosis not present

## 2023-05-10 DIAGNOSIS — R0602 Shortness of breath: Secondary | ICD-10-CM | POA: Diagnosis not present

## 2023-05-10 DIAGNOSIS — R600 Localized edema: Secondary | ICD-10-CM | POA: Diagnosis not present

## 2023-05-10 DIAGNOSIS — R42 Dizziness and giddiness: Secondary | ICD-10-CM | POA: Diagnosis present

## 2023-05-10 DIAGNOSIS — I700 Atherosclerosis of aorta: Secondary | ICD-10-CM | POA: Diagnosis not present

## 2023-05-10 DIAGNOSIS — G20A1 Parkinson's disease without dyskinesia, without mention of fluctuations: Secondary | ICD-10-CM | POA: Insufficient documentation

## 2023-05-10 DIAGNOSIS — H35033 Hypertensive retinopathy, bilateral: Secondary | ICD-10-CM | POA: Diagnosis not present

## 2023-05-10 DIAGNOSIS — R918 Other nonspecific abnormal finding of lung field: Secondary | ICD-10-CM | POA: Diagnosis not present

## 2023-05-10 HISTORY — DX: Other specified degenerative diseases of basal ganglia: G23.8

## 2023-05-10 LAB — CBC WITH DIFFERENTIAL/PLATELET
Abs Immature Granulocytes: 0.02 10*3/uL (ref 0.00–0.07)
Basophils Absolute: 0 10*3/uL (ref 0.0–0.1)
Basophils Relative: 1 %
Eosinophils Absolute: 0 10*3/uL (ref 0.0–0.5)
Eosinophils Relative: 1 %
HCT: 39.3 % (ref 36.0–46.0)
Hemoglobin: 13.3 g/dL (ref 12.0–15.0)
Immature Granulocytes: 0 %
Lymphocytes Relative: 20 %
Lymphs Abs: 1 10*3/uL (ref 0.7–4.0)
MCH: 30.4 pg (ref 26.0–34.0)
MCHC: 33.8 g/dL (ref 30.0–36.0)
MCV: 89.9 fL (ref 80.0–100.0)
Monocytes Absolute: 0.4 10*3/uL (ref 0.1–1.0)
Monocytes Relative: 9 %
Neutro Abs: 3.4 10*3/uL (ref 1.7–7.7)
Neutrophils Relative %: 69 %
Platelets: 206 10*3/uL (ref 150–400)
RBC: 4.37 MIL/uL (ref 3.87–5.11)
RDW: 13.1 % (ref 11.5–15.5)
WBC: 4.9 10*3/uL (ref 4.0–10.5)
nRBC: 0 % (ref 0.0–0.2)

## 2023-05-10 LAB — BASIC METABOLIC PANEL
Anion gap: 8 (ref 5–15)
BUN: 16 mg/dL (ref 8–23)
CO2: 27 mmol/L (ref 22–32)
Calcium: 9.2 mg/dL (ref 8.9–10.3)
Chloride: 98 mmol/L (ref 98–111)
Creatinine, Ser: 0.5 mg/dL (ref 0.44–1.00)
GFR, Estimated: >60 mL/min (ref >60)
Glucose, Bld: 98 mg/dL (ref 70–99)
Potassium: 3.8 mmol/L (ref 3.5–5.1)
Sodium: 133 mmol/L — ABNORMAL LOW (ref 135–145)

## 2023-05-10 NOTE — ED Notes (Signed)
Report given to the next RN.Marland KitchenMarland Kitchen

## 2023-05-10 NOTE — ED Provider Notes (Signed)
[place] EMERGENCY DEPARTMENT AT [facility] Provider Note   CSN: [id number] Arrival date & time: 05/10/23  1337     History  Chief Complaint  Patient presents with   Hypertension    Morgan Reese is a 72 y.o. female.   Hypertension  Patient brought in for feeling dizziness with history of multiple system atrophy.  Also has high blood pressure.  Has a history of hypertension but also has had hypotension in the past.  Feeling better now.  Some edema in the legs but that is unchanged.  Some shortness of breath but also somewhat unchanged.  Has had cough more laying back.  Blood pressure elevated at 160/110 at home.    Past Medical History:  Diagnosis Date   Hypertension    Multiple system atrophy (HCC)    Parkinson's disease (HCC)    Sinus congestion    Thyroid disease     Home Medications Prior to Admission medications   Medication Sig Start Date End Date Taking? Authorizing Provider  Ascorbic Acid (VITAMIN C) 1000 MG tablet Take 1,000 mg by mouth daily.    [provider]  busPIRone (BUSPAR) 10 MG tablet Take 1 tablet (10 mg total) by mouth 3 (three) times daily. 08/17/22   Mozingo, Thereasa Solo, NP  Cholecalciferol (VITAMIN D3 PO) Take by mouth. Alternates 10,000 units with 5,000 units each day.    [provider]  COPPER PO Take 30 mg by mouth 2 (two) times a week.    [provider]  estradiol (ESTRACE) 0.1 MG/GM vaginal cream Place 1 Applicatorful vaginally at bedtime.    [provider]  estradiol (VIVELLE-DOT) 0.0375 MG/24HR Place 1 patch onto the skin 2 (two) times a week. 04/14/21   [provider]  gabapentin (NEURONTIN) 300 MG capsule Take 1 capsule (300 mg total) by mouth 3 (three) times daily. 08/17/22   Mozingo, Thereasa Solo, NP  levothyroxine (SYNTHROID) 50 MCG tablet Take 50 mcg by mouth daily before breakfast.    [provider]  Magnesium Citrate 200 MG TABS Take 600 mg by mouth  daily.    [provider]  melatonin 3 MG TABS tablet Take 6-9 mg by mouth at bedtime.    [provider]  NON FORMULARY Apply 2 % topically daily.    [provider]  Omega-3 Fatty Acids (FISH OIL) 1000 MG CAPS Take 1 capsule by mouth at bedtime.    [provider]  progesterone (PROMETRIUM) 200 MG capsule Take 200 mg by mouth daily.    [provider]  pyridOXINE (B-6) 50 MG tablet Take 50 mg by mouth daily.    [provider]  rasagiline (AZILECT) 1 MG TABS tablet Take 1 tablet (1 mg total) by mouth daily. 05/12/22   TatOctaviano Batty, DO  thiamine 100 MG tablet Take 100 mg by mouth daily.    [provider]  VITAMIN K PO Take by mouth daily. MK7    [provider]  zinc gluconate 50 MG tablet Take 50 mg by mouth daily.    [provider]      Allergies    Amoxicillin and Serotonin reuptake inhibitors (ssris)    Review of Systems   Review of Systems  Physical Exam Updated Vital Signs BP (!) 153/90   Pulse 87   Temp 98 F (36.7 C) (Oral)   Resp 13   SpO2 100%  Physical Exam Vitals and nursing note reviewed.  Constitutional:  Appearance: Normal appearance.  HENT:     Head: Normocephalic.  Pulmonary:     Comments: Quiet voice.  Mildly harsh breath sounds diffusely. Abdominal:     Tenderness: There is no abdominal tenderness.  Musculoskeletal:     Cervical back: Neck supple.     Right lower leg: Edema present.     Left lower leg: Edema present.  Skin:    General: Skin is warm.  Neurological:     Mental Status: Mental status is at baseline.     ED Results / Procedures / Treatments   Labs (all labs ordered are listed, but only abnormal results are displayed) Labs Reviewed  BASIC METABOLIC PANEL - Abnormal; Notable for the following components:      Result Value   Sodium 133 (*)    All other components within normal limits  CBC WITH DIFFERENTIAL/PLATELET     EKG None  Radiology DG Chest Portable 1 View Result Date: 05/10/2023 CLINICAL DATA:  Shortness of breath EXAM: PORTABLE CHEST 1 VIEW COMPARISON:  01/08/2023 FINDINGS: The heart size and mediastinal contours are within normal limits. Aortic atherosclerosis. Small opacity at the periphery of the left lung base which may be due, in part, to overlapping ribs. Right lung appears clear. No pleural effusion or pneumothorax. The visualized skeletal structures are unremarkable. IMPRESSION: Small opacity at the periphery of the left lung base which may be due, in part, to overlapping ribs. Consider repeat PA and lateral chest radiographs for further evaluation. Electronically Signed   By: Duanne Guess D.O.   On: 05/10/2023 20:31    Procedures Procedures    Medications Ordered in ED Medications - No data to display  ED Course/ Medical Decision Making/ A&P                                 Medical Decision Making Amount and/or Complexity of Data Reviewed Labs: ordered. Radiology: ordered.   Patient with history of multiple system atrophy.  Has chronic vocal cord weakness.  Had hypertension.  Has had hypotension with midodrine in the past.  Blood pressure also improved here during stay.  Felt a little off.  He had some mild shortness of breath.  Will get x-ray to evaluate.  Has mild hyponatremia with sodium of 133.  This is near her baseline.  Discussed with patient and her husband.  Will get x-ray to evaluate for causes such as aspiration pneumonia.  X-ray does not show pneumonia.  Did have potential nodule that may just be overlapping ribs.  Discussed with patient and they will follow-up as an outpatient.  Feeling better.  Blood pressure has been low in the past.  Do not think we need acute treatment.  Will discharge home.         Final Clinical Impression(s) / ED Diagnoses Final diagnoses:  Hypertension, unspecified type    Rx / DC Orders ED Discharge Orders     None          Benjiman Core, MD 05/10/23 2311

## 2023-05-10 NOTE — Discharge Instructions (Signed)
Your blood pressure was elevated today.  You can monitor it but follow-up with Dr. Uvaldo Rising.  The chest x-ray also showed potential of a small mass.  He may need a follow-up x-ray.

## 2023-05-10 NOTE — ED Triage Notes (Signed)
High BP and bilateral feet swelling notice a few days ago.  Also notes a new trremor

## 2023-05-11 ENCOUNTER — Ambulatory Visit: Payer: Medicare PPO | Admitting: Adult Health

## 2023-05-11 ENCOUNTER — Encounter: Payer: Self-pay | Admitting: Adult Health

## 2023-05-11 DIAGNOSIS — F411 Generalized anxiety disorder: Secondary | ICD-10-CM | POA: Diagnosis not present

## 2023-05-11 DIAGNOSIS — R4589 Other symptoms and signs involving emotional state: Secondary | ICD-10-CM

## 2023-05-11 NOTE — Progress Notes (Signed)
Morgan Reese [id number] Oct 19, 1950 72 y.o.  Subjective:   Patient ID:  Morgan Reese is a 72 y.o. (DOB [birth year]) female.  Chief Complaint: No chief complaint on file.   HPI ATHIRA BOHON presents to the office today for follow-up of anxiety disorder.  Accompanied by husband to help with clarification.  Describes mood today as "better". Pleasant. Denies tearfulness. Mood symptoms - reports some depression and irritability. Reports feeling more anxious overall. Reports panic attacks. Reports worry, rumination, and over thinking. Mood is variable. Stating "I feel like some days are better than others ". Feels like medications are helpful - stopped the Buspar a week ago. Reports she continues to take the Gabapentin. Improved interest and motivation. Taking medications as prescribed. Energy levels variable. Active, does not a regular exercise routine. Enjoys some usual interests and activities. Married - lives with husband. Has 2 adult children. Spending time with family.  Appetite adequate. Weight stable - 116 pounds. Sleeps better some nights than others. Averages 6 hours. Focus and concentration varies. Completing tasks. Managing aspects of household. Retired. Denies SI or HI.  Denies AH or VH. Denies self harm. Denies substance use. Denies paranoia.   Flowsheet Row ED from 05/10/2023 in [facility] Emergency Department at [facility] ED from 04/21/2022 in [facility] Emergency Department at [facility] ED from 02/07/2022 in [facility] Emergency Department at [facility]  C-SSRS RISK CATEGORY No Risk No Risk No Risk        Review of Systems:  Review of Systems  Musculoskeletal:  Negative for gait problem.  Neurological:  Negative for tremors.  Psychiatric/Behavioral:         Please refer to HPI    Medications: I have reviewed the patient's current medications.  Current Outpatient Medications  Medication Sig Dispense Refill   Ascorbic Acid  (VITAMIN C) 1000 MG tablet Take 1,000 mg by mouth daily.     busPIRone (BUSPAR) 10 MG tablet Take 1 tablet (10 mg total) by mouth 3 (three) times daily. 270 tablet 3   Cholecalciferol (VITAMIN D3 PO) Take by mouth. Alternates 10,000 units with 5,000 units each day.     COPPER PO Take 30 mg by mouth 2 (two) times a week.     estradiol (ESTRACE) 0.1 MG/GM vaginal cream Place 1 Applicatorful vaginally at bedtime.     estradiol (VIVELLE-DOT) 0.0375 MG/24HR Place 1 patch onto the skin 2 (two) times a week.     gabapentin (NEURONTIN) 300 MG capsule Take 1 capsule (300 mg total) by mouth 3 (three) times daily. 270 capsule 3   levothyroxine (SYNTHROID) 50 MCG tablet Take 50 mcg by mouth daily before breakfast.     Magnesium Citrate 200 MG TABS Take 600 mg by mouth daily.     melatonin 3 MG TABS tablet Take 6-9 mg by mouth at bedtime.     NON FORMULARY Apply 2 % topically daily.     Omega-3 Fatty Acids (FISH OIL) 1000 MG CAPS Take 1 capsule by mouth at bedtime.     progesterone (PROMETRIUM) 200 MG capsule Take 200 mg by mouth daily.     pyridOXINE (B-6) 50 MG tablet Take 50 mg by mouth daily.     rasagiline (AZILECT) 1 MG TABS tablet Take 1 tablet (1 mg total) by mouth daily. 90 tablet 1   thiamine 100 MG tablet Take 100 mg by mouth daily.     VITAMIN K PO Take by mouth daily. MK7     zinc gluconate  50 MG tablet Take 50 mg by mouth daily.     No current facility-administered medications for this visit.    Medication Side Effects: None  Allergies:  Allergies  Allergen Reactions   Amoxicillin Swelling    REACTION: Head felt over-heated Other reaction(s): Other (See Comments) REACTION: Head felt over-heated    Serotonin Reuptake Inhibitors (Ssris) Other (See Comments)    Other reaction(s): hyponatremia SSRI's are contraindicated, causes hyponatremia     Past Medical History:  Diagnosis Date   Hypertension    Multiple system atrophy (HCC)    Parkinson's disease (HCC)    Sinus congestion     Thyroid disease     Past Medical History, Surgical history, Social history, and Family history were reviewed and updated as appropriate.   Please see review of systems for further details on the patient's review from today.   Objective:   Physical Exam:  There were no vitals taken for this visit.  Physical Exam Constitutional:      General: She is not in acute distress. Musculoskeletal:        General: No deformity.  Neurological:     Mental Status: She is alert and oriented to person, place, and time.     Coordination: Coordination normal.  Psychiatric:        Attention and Perception: Attention and perception normal. She does not perceive auditory or visual hallucinations.        Mood and Affect: Mood normal. Mood is not anxious or depressed. Affect is not labile, blunt, angry or inappropriate.        Speech: Speech normal.        Behavior: Behavior normal.        Thought Content: Thought content normal. Thought content is not paranoid or delusional. Thought content does not include homicidal or suicidal ideation. Thought content does not include homicidal or suicidal plan.        Cognition and Memory: Cognition and memory normal.        Judgment: Judgment normal.     Comments: Insight intact     Lab Review:     Component Value Date/Time   NA 133 (L) 05/10/2023 1418   K 3.8 05/10/2023 1418   CL 98 05/10/2023 1418   CO2 27 05/10/2023 1418   GLUCOSE 98 05/10/2023 1418   BUN 16 05/10/2023 1418   CREATININE 0.50 05/10/2023 1418   CALCIUM 9.2 05/10/2023 1418   PROT 6.2 (L) 04/21/2022 1410   ALBUMIN 4.0 04/21/2022 1410   AST 25 04/21/2022 1410   ALT 29 04/21/2022 1410   ALKPHOS 59 04/21/2022 1410   BILITOT 0.5 04/21/2022 1410   GFRNONAA >60 05/10/2023 1418       Component Value Date/Time   WBC 4.9 05/10/2023 1418   RBC 4.37 05/10/2023 1418   HGB 13.3 05/10/2023 1418   HCT 39.3 05/10/2023 1418   PLT 206 05/10/2023 1418   MCV 89.9 05/10/2023 1418   MCH 30.4  05/10/2023 1418   MCHC 33.8 05/10/2023 1418   RDW 13.1 05/10/2023 1418   LYMPHSABS 1.0 05/10/2023 1418   MONOABS 0.4 05/10/2023 1418   EOSABS 0.0 05/10/2023 1418   BASOSABS 0.0 05/10/2023 1418    No results found for: "POCLITH", "LITHIUM"   No results found for: "PHENYTOIN", "PHENOBARB", "VALPROATE", "CBMZ"   .res Assessment: Plan:    Plan:  Working with [facility] neurologist   Buspar 10mg  - three times daily for anxiety  Gabapentin 300mg  TID - anxiety  PDMP reviewed  Discussed potential benefits, risk, and side effects of benzodiazepines to include potential risk of tolerance and dependence, as well as possible drowsiness. Advised patient not to drive if experiencing drowsiness and to take lowest possible effective dose to minimize risk of dependence and tolerance.   Time spent with patient was 10 minutes. Greater than 50% of face to face time with patient was spent on counseling and coordination of care.    RTC 3/6 months  There are no diagnoses linked to this encounter.   Please see After Visit Summary for patient specific instructions.  Future Appointments  Date Time Provider Department Center  05/11/2023  1:20 PM Kemyah Buser, Thereasa Solo, NP CP-CP None    No orders of the defined types were placed in this encounter.   -------------------------------

## 2023-05-30 DIAGNOSIS — J3802 Paralysis of vocal cords and larynx, bilateral: Secondary | ICD-10-CM | POA: Diagnosis not present

## 2023-05-30 DIAGNOSIS — R490 Dysphonia: Secondary | ICD-10-CM | POA: Diagnosis not present

## 2023-05-30 DIAGNOSIS — R0602 Shortness of breath: Secondary | ICD-10-CM | POA: Diagnosis not present

## 2023-07-04 DIAGNOSIS — E039 Hypothyroidism, unspecified: Secondary | ICD-10-CM | POA: Diagnosis not present

## 2023-07-04 DIAGNOSIS — R1313 Dysphagia, pharyngeal phase: Secondary | ICD-10-CM | POA: Diagnosis not present

## 2023-07-04 DIAGNOSIS — G912 (Idiopathic) normal pressure hydrocephalus: Secondary | ICD-10-CM | POA: Diagnosis not present

## 2023-07-04 DIAGNOSIS — G20A1 Parkinson's disease without dyskinesia, without mention of fluctuations: Secondary | ICD-10-CM | POA: Diagnosis not present

## 2023-07-04 DIAGNOSIS — Z01818 Encounter for other preprocedural examination: Secondary | ICD-10-CM | POA: Diagnosis not present

## 2023-07-28 DIAGNOSIS — D1801 Hemangioma of skin and subcutaneous tissue: Secondary | ICD-10-CM | POA: Diagnosis not present

## 2023-07-28 DIAGNOSIS — Z85828 Personal history of other malignant neoplasm of skin: Secondary | ICD-10-CM | POA: Diagnosis not present

## 2023-07-28 DIAGNOSIS — L570 Actinic keratosis: Secondary | ICD-10-CM | POA: Diagnosis not present

## 2023-07-28 DIAGNOSIS — G20C Parkinsonism, unspecified: Secondary | ICD-10-CM | POA: Diagnosis not present

## 2023-07-28 DIAGNOSIS — L814 Other melanin hyperpigmentation: Secondary | ICD-10-CM | POA: Diagnosis not present

## 2023-07-28 DIAGNOSIS — R061 Stridor: Secondary | ICD-10-CM | POA: Diagnosis not present

## 2023-07-28 DIAGNOSIS — Z1330 Encounter for screening examination for mental health and behavioral disorders, unspecified: Secondary | ICD-10-CM | POA: Diagnosis not present

## 2023-07-29 ENCOUNTER — Other Ambulatory Visit: Payer: Self-pay | Admitting: Adult Health

## 2023-07-29 DIAGNOSIS — F411 Generalized anxiety disorder: Secondary | ICD-10-CM

## 2023-07-29 DIAGNOSIS — E039 Hypothyroidism, unspecified: Secondary | ICD-10-CM | POA: Diagnosis not present

## 2023-08-02 ENCOUNTER — Telehealth: Payer: Medicare PPO | Admitting: Adult Health

## 2023-08-02 ENCOUNTER — Encounter: Payer: Self-pay | Admitting: Adult Health

## 2023-08-02 DIAGNOSIS — F411 Generalized anxiety disorder: Secondary | ICD-10-CM

## 2023-08-02 MED ORDER — GABAPENTIN 300 MG PO CAPS: 300.0000 mg | ORAL_CAPSULE | Freq: Three times a day (TID) | ORAL | 3 refills | Status: DC

## 2023-08-02 NOTE — Progress Notes (Signed)
 Morgan Reese [id number] 1951-04-29 73 y.o.  Virtual Visit via Video Note  I connected with pt @ on 08/02/23 at  2:00 PM EDT by a video enabled telemedicine application and verified that I am speaking with the correct person using two identifiers.   I discussed the limitations of evaluation and management by telemedicine and the availability of in person appointments. The patient expressed understanding and agreed to proceed.  I discussed the assessment and treatment plan with the patient. The patient was provided an opportunity to ask questions and all were answered. The patient agreed with the plan and demonstrated an understanding of the instructions.   The patient was advised to call back or seek an in-person evaluation if the symptoms worsen or if the condition fails to improve as anticipated.  I provided 20 minutes of non-face-to-face time during this encounter.  The patient was located at home.  The provider was located at [facility].   Dorothyann Gibbs, NP   Subjective:   Patient ID:  Morgan Reese is a 73 y.o. (DOB 06-04-1950) female.  Chief Complaint: No chief complaint on file.   HPI Morgan Reese presents for follow-up of anxiety disorder.  Accompanied by husband to help with clarification.  Describes mood today as "better". Pleasant. Reports tearfulness. Mood symptoms - reports some depression. Reports varying interest and motivation. Reports anxiety related to upcoming surgery. Denies irritability. Reports feeling more anxious overall. Denies recent panic attacks. Reports some worry, rumination and over thinking - "mostly related to upcoming surgery". Mood is variable. Stating "I'm worried about the surgery". Feels like medications are helpful - Gabapentin. Taking medications as prescribed. Energy levels lower. Active, does not a regular exercise routine. Enjoys some usual interests and activities. Married - lives with husband. Has 2 adult  children. Spending time with family.  Appetite adequate. Weight stable - 115 pounds. Sleeps well most nights. Averages 6 hours of solid sleep - then wakes up for a period of time and goes back to sleep for 2 hours. Focus and concentration stable. Completing tasks. Managing aspects of household. Retired. Denies SI or HI.  Denies AH or VH. Denies self harm. Denies substance use. Denies paranoia.  Review of Systems:  Review of Systems  Musculoskeletal:  Negative for gait problem.  Neurological:  Negative for tremors.  Psychiatric/Behavioral:         Please refer to HPI    Medications: I have reviewed the patient's current medications.  Current Outpatient Medications  Medication Sig Dispense Refill   Ascorbic Acid (VITAMIN C) 1000 MG tablet Take 1,000 mg by mouth daily.     busPIRone (BUSPAR) 10 MG tablet Take 1 tablet (10 mg total) by mouth 3 (three) times daily. 270 tablet 3   Cholecalciferol (VITAMIN D3 PO) Take by mouth. Alternates 10,000 units with 5,000 units each day.     COPPER PO Take 30 mg by mouth 2 (two) times a week.     estradiol (ESTRACE) 0.1 MG/GM vaginal cream Place 1 Applicatorful vaginally at bedtime.     estradiol (VIVELLE-DOT) 0.0375 MG/24HR Place 1 patch onto the skin 2 (two) times a week.     gabapentin (NEURONTIN) 300 MG capsule Take 1 capsule (300 mg total) by mouth 3 (three) times daily. 270 capsule 3   levothyroxine (SYNTHROID) 50 MCG tablet Take 50 mcg by mouth daily before breakfast.     Magnesium Citrate 200 MG TABS Take 600 mg by mouth daily.     melatonin 3 MG  TABS tablet Take 6-9 mg by mouth at bedtime.     NON FORMULARY Apply 2 % topically daily.     Omega-3 Fatty Acids (FISH OIL) 1000 MG CAPS Take 1 capsule by mouth at bedtime.     progesterone (PROMETRIUM) 200 MG capsule Take 200 mg by mouth daily.     pyridOXINE (B-6) 50 MG tablet Take 50 mg by mouth daily.     rasagiline (AZILECT) 1 MG TABS tablet Take 1 tablet (1 mg total) by mouth daily. 90  tablet 1   thiamine 100 MG tablet Take 100 mg by mouth daily.     VITAMIN K PO Take by mouth daily. MK7     zinc gluconate 50 MG tablet Take 50 mg by mouth daily.     No current facility-administered medications for this visit.    Medication Side Effects: None  Allergies:  Allergies  Allergen Reactions   Amoxicillin Swelling and Other (See Comments)    REACTION: Head felt over-heated  Other reaction(s): Other (See Comments)  REACTION: Head felt over-heated    REACTION: Head felt over-heated Other reaction(s): Other (See Comments) REACTION: Head felt over-heated   Gluten Meal Other (See Comments)   Latex Other (See Comments)    Skin irritation   Serotonin Other (See Comments)    Other reaction(s): hyponatremia SSRI's are contraindicated, causes hyponatremia    SSRI's are contraindicated, causes hyponatremia  Other reaction(s): hyponatremia SSRI's are contraindicated, causes hyponatremia  Other reaction(s): hyponatremia SSRI's are contraindicated, causes hyponatremia  SSRI's are contraindicated, causes hyponatremia  Other reaction(s): hyponatremia SSRI's are contraindicated, causes hyponatremia   Serotonin Reuptake Inhibitors (Ssris) Other (See Comments)    Other reaction(s): hyponatremia SSRI's are contraindicated, causes hyponatremia     Past Medical History:  Diagnosis Date   Hypertension    Multiple system atrophy (HCC)    Parkinson's disease (HCC)    Sinus congestion    Thyroid disease     Family History  Problem Relation Age of Onset   Tremor Mother    Depression Mother    Cancer Father        Lung Cancer   Tremor Maternal Grandmother    Diabetes Paternal Grandmother    Stroke Paternal Uncle    Healthy Child    Parkinson's disease Neg Hx     Social History   Socioeconomic History   Marital status: Married    Spouse name: Not on file   Number of children: 2   Years of education: Not on file   Highest education level: Not on file  Occupational  History   Occupation: retired    Comment: PhD; early childhood education  Tobacco Use   Smoking status: Never   Smokeless tobacco: Never  Vaping Use   Vaping status: Never Used  Substance and Sexual Activity   Alcohol use: Never   Drug use: Never   Sexual activity: Not on file  Other Topics Concern   Not on file  Social History Narrative   Right handed   Caffeine: none    Lives in a two story home with main bedroom on first floor.    Social Drivers of Corporate investment banker Strain: Low Risk  (07/27/2023)   Received from [facility]   Overall Financial Resource Strain (CARDIA)    Difficulty of Paying Living Expenses: Not hard at all  Food Insecurity: No Food Insecurity (07/27/2023)   Received from [facility]   Hunger Vital Sign    Worried About Running  Out of Food in the Last Year: Never true    Ran Out of Food in the Last Year: Never true  Transportation Needs: Unknown (07/27/2023)   Received from [facility] - Transportation    Lack of Transportation (Medical): Patient declined    Lack of Transportation (Non-Medical): No  Physical Activity: Insufficiently Active (07/27/2023)   Received from [facility]   Exercise Vital Sign    Days of Exercise per Week: 2 days    Minutes of Exercise per Session: 30 min  Stress: Not on file  Social Connections: Somewhat Isolated (07/27/2023)   Received from [facility]   Social Network    How would you rate your social network (family, work, friends)?: Restricted participation with some degree of social isolation  Intimate Partner Violence: Unknown (07/27/2023)   Received from [facility]   HITS    Physically Hurt: Not on file    Over the last 12 months how often did your partner insult you or talk down to you?: Sometimes    Over the last 12 months how often did your partner threaten you with physical harm?: Never    Over the last 12 months how often did your partner scream or curse at you?: Rarely    Past Medical  History, Surgical history, Social history, and Family history were reviewed and updated as appropriate.   Please see review of systems for further details on the patient's review from today.   Objective:   Physical Exam:  There were no vitals taken for this visit.  Physical Exam Constitutional:      General: She is not in acute distress. Musculoskeletal:        General: No deformity.  Neurological:     Mental Status: She is alert and oriented to person, place, and time.     Coordination: Coordination normal.  Psychiatric:        Attention and Perception: Attention and perception normal. She does not perceive auditory or visual hallucinations.        Mood and Affect: Affect is not labile, blunt, angry or inappropriate.        Speech: Speech normal.        Behavior: Behavior normal.        Thought Content: Thought content normal. Thought content is not paranoid or delusional. Thought content does not include homicidal or suicidal ideation. Thought content does not include homicidal or suicidal plan.        Cognition and Memory: Cognition and memory normal.        Judgment: Judgment normal.     Comments: Insight intact     Lab Review:     Component Value Date/Time   NA 133 (L) 05/10/2023 1418   K 3.8 05/10/2023 1418   CL 98 05/10/2023 1418   CO2 27 05/10/2023 1418   GLUCOSE 98 05/10/2023 1418   BUN 16 05/10/2023 1418   CREATININE 0.50 05/10/2023 1418   CALCIUM 9.2 05/10/2023 1418   PROT 6.2 (L) 04/21/2022 1410   ALBUMIN 4.0 04/21/2022 1410   AST 25 04/21/2022 1410   ALT 29 04/21/2022 1410   ALKPHOS 59 04/21/2022 1410   BILITOT 0.5 04/21/2022 1410   GFRNONAA >60 05/10/2023 1418       Component Value Date/Time   WBC 4.9 05/10/2023 1418   RBC 4.37 05/10/2023 1418   HGB 13.3 05/10/2023 1418   HCT 39.3 05/10/2023 1418   PLT 206 05/10/2023 1418   MCV 89.9 05/10/2023 1418  MCH 30.4 05/10/2023 1418   MCHC 33.8 05/10/2023 1418   RDW 13.1 05/10/2023 1418   LYMPHSABS  1.0 05/10/2023 1418   MONOABS 0.4 05/10/2023 1418   EOSABS 0.0 05/10/2023 1418   BASOSABS 0.0 05/10/2023 1418    No results found for: "POCLITH", "LITHIUM"   No results found for: "PHENYTOIN", "PHENOBARB", "VALPROATE", "CBMZ"   .res Assessment: Plan:    Plan:  Working with [facility] neurologist   Gabapentin 300mg  TID - anxiety  Reports upcoming Tracheotomy.  PDMP reviewed  RTC 3 months  20 minutes spent dedicated to the care of this patient on the date of this encounter to include pre-visit review of records, ordering of medication, post visit documentation, and face-to-face time with the patient discussing depression, anxiety and OCD. Discussed continuing current medication regimen.  There are no diagnoses linked to this encounter.   Please see After Visit Summary for patient specific instructions.  Future Appointments  Date Time Provider Department Center  08/02/2023  2:00 PM Miku Udall, Thereasa Solo, NP CP-CP None    No orders of the defined types were placed in this encounter.     -------------------------------

## 2023-08-03 ENCOUNTER — Other Ambulatory Visit: Payer: Self-pay | Admitting: Family Medicine

## 2023-08-03 DIAGNOSIS — B351 Tinea unguium: Secondary | ICD-10-CM | POA: Diagnosis not present

## 2023-08-03 DIAGNOSIS — N951 Menopausal and female climacteric states: Secondary | ICD-10-CM | POA: Diagnosis not present

## 2023-08-03 DIAGNOSIS — R490 Dysphonia: Secondary | ICD-10-CM | POA: Diagnosis not present

## 2023-08-03 DIAGNOSIS — J3089 Other allergic rhinitis: Secondary | ICD-10-CM | POA: Diagnosis not present

## 2023-08-03 DIAGNOSIS — E039 Hypothyroidism, unspecified: Secondary | ICD-10-CM | POA: Diagnosis not present

## 2023-08-03 DIAGNOSIS — Z1231 Encounter for screening mammogram for malignant neoplasm of breast: Secondary | ICD-10-CM

## 2023-08-03 DIAGNOSIS — R061 Stridor: Secondary | ICD-10-CM | POA: Diagnosis not present

## 2023-08-03 DIAGNOSIS — Z6821 Body mass index (BMI) 21.0-21.9, adult: Secondary | ICD-10-CM | POA: Diagnosis not present

## 2023-08-03 DIAGNOSIS — Z23 Encounter for immunization: Secondary | ICD-10-CM | POA: Diagnosis not present

## 2023-08-03 DIAGNOSIS — G20C Parkinsonism, unspecified: Secondary | ICD-10-CM | POA: Diagnosis not present

## 2023-08-08 ENCOUNTER — Ambulatory Visit
Admission: RE | Admit: 2023-08-08 | Discharge: 2023-08-08 | Disposition: A | Discharge status: Home / Self Care | Source: Ambulatory Visit | Attending: Family Medicine | Admitting: Family Medicine

## 2023-08-08 DIAGNOSIS — Z1231 Encounter for screening mammogram for malignant neoplasm of breast: Secondary | ICD-10-CM | POA: Diagnosis not present

## 2023-08-18 DIAGNOSIS — Z930 Tracheostomy status: Secondary | ICD-10-CM | POA: Diagnosis not present

## 2023-08-18 DIAGNOSIS — G20A1 Parkinson's disease without dyskinesia, without mention of fluctuations: Secondary | ICD-10-CM | POA: Diagnosis not present

## 2023-08-18 DIAGNOSIS — D62 Acute posthemorrhagic anemia: Secondary | ICD-10-CM | POA: Diagnosis not present

## 2023-08-18 DIAGNOSIS — Z79899 Other long term (current) drug therapy: Secondary | ICD-10-CM | POA: Diagnosis not present

## 2023-08-18 DIAGNOSIS — J398 Other specified diseases of upper respiratory tract: Secondary | ICD-10-CM | POA: Diagnosis not present

## 2023-08-18 DIAGNOSIS — J3802 Paralysis of vocal cords and larynx, bilateral: Secondary | ICD-10-CM | POA: Diagnosis not present

## 2023-08-19 DIAGNOSIS — D62 Acute posthemorrhagic anemia: Secondary | ICD-10-CM | POA: Diagnosis not present

## 2023-08-19 DIAGNOSIS — Z79899 Other long term (current) drug therapy: Secondary | ICD-10-CM | POA: Diagnosis not present

## 2023-08-19 DIAGNOSIS — J3802 Paralysis of vocal cords and larynx, bilateral: Secondary | ICD-10-CM | POA: Diagnosis not present

## 2023-08-19 DIAGNOSIS — G20A1 Parkinson's disease without dyskinesia, without mention of fluctuations: Secondary | ICD-10-CM | POA: Diagnosis not present

## 2023-08-19 DIAGNOSIS — Z930 Tracheostomy status: Secondary | ICD-10-CM | POA: Diagnosis not present

## 2023-08-19 DIAGNOSIS — J398 Other specified diseases of upper respiratory tract: Secondary | ICD-10-CM | POA: Diagnosis not present

## 2023-08-20 DIAGNOSIS — J398 Other specified diseases of upper respiratory tract: Secondary | ICD-10-CM | POA: Diagnosis not present

## 2023-08-20 DIAGNOSIS — Z79899 Other long term (current) drug therapy: Secondary | ICD-10-CM | POA: Diagnosis not present

## 2023-08-20 DIAGNOSIS — G20A1 Parkinson's disease without dyskinesia, without mention of fluctuations: Secondary | ICD-10-CM | POA: Diagnosis not present

## 2023-08-20 DIAGNOSIS — J3802 Paralysis of vocal cords and larynx, bilateral: Secondary | ICD-10-CM | POA: Diagnosis not present

## 2023-08-20 DIAGNOSIS — D62 Acute posthemorrhagic anemia: Secondary | ICD-10-CM | POA: Diagnosis not present

## 2023-08-21 DIAGNOSIS — J3802 Paralysis of vocal cords and larynx, bilateral: Secondary | ICD-10-CM | POA: Diagnosis not present

## 2023-08-21 DIAGNOSIS — D62 Acute posthemorrhagic anemia: Secondary | ICD-10-CM | POA: Diagnosis not present

## 2023-08-21 DIAGNOSIS — J398 Other specified diseases of upper respiratory tract: Secondary | ICD-10-CM | POA: Diagnosis not present

## 2023-08-21 DIAGNOSIS — G20A1 Parkinson's disease without dyskinesia, without mention of fluctuations: Secondary | ICD-10-CM | POA: Diagnosis not present

## 2023-08-21 DIAGNOSIS — Z79899 Other long term (current) drug therapy: Secondary | ICD-10-CM | POA: Diagnosis not present

## 2023-08-22 DIAGNOSIS — G20A1 Parkinson's disease without dyskinesia, without mention of fluctuations: Secondary | ICD-10-CM | POA: Diagnosis not present

## 2023-08-22 DIAGNOSIS — R1312 Dysphagia, oropharyngeal phase: Secondary | ICD-10-CM | POA: Diagnosis not present

## 2023-08-22 DIAGNOSIS — D62 Acute posthemorrhagic anemia: Secondary | ICD-10-CM | POA: Diagnosis not present

## 2023-08-22 DIAGNOSIS — Z930 Tracheostomy status: Secondary | ICD-10-CM | POA: Diagnosis not present

## 2023-08-22 DIAGNOSIS — Z79899 Other long term (current) drug therapy: Secondary | ICD-10-CM | POA: Diagnosis not present

## 2023-08-22 DIAGNOSIS — J398 Other specified diseases of upper respiratory tract: Secondary | ICD-10-CM | POA: Diagnosis not present

## 2023-08-22 DIAGNOSIS — R6339 Other feeding difficulties: Secondary | ICD-10-CM | POA: Diagnosis not present

## 2023-08-22 DIAGNOSIS — J3802 Paralysis of vocal cords and larynx, bilateral: Secondary | ICD-10-CM | POA: Diagnosis not present

## 2023-08-22 NOTE — Discharge Summary (Signed)
 Otolaryngology Discharge Summary  Patient ID: Morgan Reese [id number] 72 y.o. October 31, 1950  Admit date: 08/18/2023 Admitting Physician: Garnette JAYSON Brien Mickey., MD Admission Condition: stable Admission Diagnoses:   Present on Admission: . Stenosis of trachea   Discharge date and time: 08/22/2023 at approximately 2:03 PM Discharge Physician: Garnette JAYSON Brien Mickey., MD  Discharged Condition: stable Discharge Diagnoses:  Principal Problem:   Tracheostomy dependence (CMD) Active Problems:   Stenosis of trachea Resolved Problems:   * No resolved hospital problems. *  Procedures/Surgeries performed during hospitalization:  TRACHEOTOMY (Bilateral: Trachea)  Problem List Items Addressed This Visit     Hoarseness   Relevant Orders   Trach Tube   Suction Machine/Supplies   Multiple system atrophy P (CMD)   Relevant Orders   Trach Tube   Suction Machine/Supplies   NPH (normal pressure hydrocephalus) (CMD)   Relevant Orders   Trach Tube   Suction Machine/Supplies   Pharyngeal dysphagia   Relevant Orders   Trach Tube   Suction Machine/Supplies   Parkinson's disease without dyskinesia or fluctuating manifestations (CMD)   Relevant Medications   gabapentin  (NEURONTIN ) capsule 300 mg   pramipexole  (MIRAPEX ) tablet 1 mg   Other Relevant Orders   Trach Tube   Suction Machine/Supplies   Generalized anxiety disorder   Relevant Orders   Trach Tube   Suction Machine/Supplies   * (Principal) Tracheostomy dependence (CMD) - Primary   Relevant Orders   Trach Tube   Suction Machine/Supplies   Ambulatory referral to Speech Therapy    Indication for Admission: observation after surgery  Hospital Course: Morgan Reese was taken to the operating room on 08/18/2023 where she underwent the above mentioned procedure(s) without complication. She did well postoperatively and was monitored in a regular floor bed after being observed for a short while in PACU and deemed stable for  transfer.  She remained afebrile and hemodynamically stable throughout hospitalization. She was transitioned to a regular diet which was well tolerated without nausea or vomiting.  Pain control was achieved on PO pain medications. She continued to have a normal UOP with normal vital signs for this patient. She was alert and oriented throughout hospitalization.   At the time of discharge, the patient was afebrile, in no acute distress and vital signs were stable. New discharge medications were discussed in detail and the patient stated understanding of use and administration. The patient verbalized understanding of all discharge instructions and therefore was released.  Consults: IP CONSULT TO RESPIRATORY CARE  Significant Diagnostic Studies:   As above  Treatments:  TRACHEOTOMY  Current Facility-Administered Medications:  .  acetaminophen  (TYLENOL ) tablet 1,000 mg, 1,000 mg, oral, Q6H SCH, Joann Bluj, MD, 1,000 mg at 08/22/23 1225 .  ALPRAZolam  (XANAX ) tablet 0.5 mg, 0.5 mg, oral, BID PRN, Joann Bluj, MD .  bacitracin ointment tube 1 Application, 1 Application, topical, BID, Sheena Enis Dawn, DO, 1 Application at 08/22/23 0900 .  celecoxib (CeleBREX) capsule 200 mg, 200 mg, oral, BID, Joann Bluj, MD, 200 mg at 08/22/23 0813 .  dextrose (D50W) 50 % injection 12.5 g, 12.5 g, intravenous, PRN, Joann Bluj, MD .  dextrose (TRUEPLUS GLUCOSE) 15 gram/32 mL oral gel 15 g, 15 g, oral, PRN, Joann Bluj, MD .  docusate sodium  (COLACE) capsule 100 mg, 100 mg, oral, BID PRN, Vinie Lamar Grimmer, MD .  enoxaparin  (LOVENOX ) syringe 40 mg, 40 mg, subcutaneous, At Bedtime, Joann Bluj, MD, 40 mg at 08/21/23 2045 .  gabapentin  (NEURONTIN ) capsule 300 mg, 300 mg,  oral, TID, Joann Bluj, MD, 300 mg at 08/22/23 0555 .  levothyroxine  (SYNTHROID ) tablet 50 mcg, 50 mcg, oral, Daily, Joann Bluj, MD, 50 mcg at 08/22/23 0813 .  melatonin tablet 9 mg, 9 mg, oral, Every evening, Joann Bluj, MD, 9 mg at 08/21/23 2045 .   ondansetron  (ZOFRAN -ODT) disintegrating tablet 4 mg, 4 mg, oral, Q6H PRN, Joann Bluj, MD .  oxyCODONE  (ROXICODONE ) immediate release tablet 5 mg, 5 mg, oral, Q4H PRN, Joann Bluj, MD .  polyethylene glycol (GLYCOLAX) packet 17 g, 17 g, oral, Daily, Joann Bluj, MD, 17 g at 08/22/23 0813 .  pramipexole  (MIRAPEX ) tablet 1 mg, 1 mg, oral, TID, Joann Bluj, MD, 1 mg at 08/22/23 0817 .  prochlorperazine (COMPAZINE) injection 5 mg, 5 mg, intravenous, Q6H PRN, Joann Bluj, MD .  progesterone  (PROMETRIUM ) capsule 200 mg, 200 mg, oral, At Bedtime, Joann Bluj, MD, 200 mg at 08/21/23 2048  Medications Discontinued During This Encounter  Medication Reason  . sodium chloride  (irrigation) (NS) 0.9 % irrigation solution Patient Discharge  . lactated ringer's infusion 1,000 mL Patient Transfer  . ondansetron  (ZOFRAN ) injection 4 mg Patient Transfer  . cholecalciferol 250 mcg (10,000 unit) cap Error  . MISCELLANEOUS MEDICATION/PRODUCT Error  . melatonin tablet 3 mg   . norepinephrine (LEVOPHED) infusion 16 mg/250 mL NS (64 mcg/mL) premix      Discharge Exam:  Gen: AAO, NAD HEENT:  Neck soft, flat 6UN in place, secured with dale collar Pulm: Normal work of breathing; trach collar Extremities: Warm, well perfused. Moving all equally, no gross deficits.  Disposition: home  Discharge Medications and Instructions:  Discharge Medications:   Medication List     CONTINUE taking these medications    ALPRAZolam  0.5 mg tablet Commonly known as: XANAX  Take 0.5 mg by mouth 2 (two) times a day as needed.   ascorbic acid 1,000 mg tablet Commonly known as: VITAMIN C Take 1,000 mg by mouth Once Daily.   cholecalciferol 125 mcg (5,000 unit) capsule Commonly known as: VITAMIN D3 Take 5,000 Units by mouth every other day. Alternating with 10,000 units   * estradioL  0.0375 mg/24 hr patch Commonly known as: CLIMARA  Place 1 patch on the skin 2 (two) times a week.   * estradioL  0.01 % (0.1 mg/gram) vaginal  cream Commonly known as: ESTRACE  Insert 0.5 g into the vagina Once Daily.   Fish OiL 340-1,000 mg Cap capsule Generic drug: omega-3 fatty acids-fish oil Take 1 g by mouth nightly.   fluticasone propionate 50 mcg/spray nasal spray Commonly known as: FLONASE Administer 1 spray into each nostril daily as needed for allergies.   gabapentin  300 mg capsule Commonly known as: NEURONTIN  Take 300 mg by mouth 3 (three) times a day Indications: neuropathic pain. 1 tab TID   levothyroxine  50 mcg tablet Commonly known as: SYNTHROID  Take 50 mcg by mouth Once Daily.   melatonin 10 mg tablet Take 10 mg by mouth at bedtime.   mfolate calcium-mecobalamin 25,000 mcg DFE- 2,000 mcg Cap Take 1 capsule by mouth daily.   pramipexole  0.5 mg tablet Commonly known as: MIRAPEX  Take 1 mg by mouth 3 (three) times a day Indications: Parkinson's disease. 2 tabs TID   progesterone  100 mg Cap capsule Commonly known as: PROMETRIUM  Take 200 mg by mouth at bedtime.   pyridoxine  50 mg tablet Commonly known as: VITAMIN B6 Take 50 mg by mouth Once Daily.   thiamine 100 mg tablet Commonly known as: VITAMIN B1 Take 100 mg by mouth daily.  zinc  gluconate 50 mg Tab tablet Take 50 mg by mouth Once Daily.      * * This list has 2 medication(s) that are the same as other medications prescribed for you. Read the directions carefully, and ask your doctor or other care provider to review them with you.          See AVS for patient instructions.  Scheduled Future Appointments       Provider Department Dept Phone Center   09/05/2023 12:30 PM Kristen Laroy Cornea [facility] Outpatient Speech Language Pathology [phone] [facility] [place]   09/12/2023 1:10 PM Garnette JAYSON Brien Mickey. [facility] ENT [phone] [facility] [place]   11/02/2023 2:40 PM Toya GORMAN Hatchet [facility] [place] [facility] GENERAL NEUROLOGY [phone] [facility] [place]        The patient was instructed to call if she develops a fever greater than 101.5, any drainage from the incision (when applicable), redness extending from his incision, unable to void, or any other questions or concerns.  The patient was instructed to return to see Dr. Brien in 4-6 weeks.  Electronically signed by:  Jesusa Enis Dawn, DO 08/22/2023 2:03 PM     *Some images could not be shown.

## 2023-08-24 DIAGNOSIS — R051 Acute cough: Secondary | ICD-10-CM | POA: Diagnosis not present

## 2023-08-24 DIAGNOSIS — J40 Bronchitis, not specified as acute or chronic: Secondary | ICD-10-CM | POA: Diagnosis not present

## 2023-08-26 DIAGNOSIS — Z430 Encounter for attention to tracheostomy: Secondary | ICD-10-CM | POA: Diagnosis not present

## 2023-08-31 DIAGNOSIS — R300 Dysuria: Secondary | ICD-10-CM | POA: Diagnosis not present

## 2023-08-31 DIAGNOSIS — R1312 Dysphagia, oropharyngeal phase: Secondary | ICD-10-CM | POA: Diagnosis not present

## 2023-09-02 DIAGNOSIS — M8580 Other specified disorders of bone density and structure, unspecified site: Secondary | ICD-10-CM | POA: Diagnosis not present

## 2023-09-02 DIAGNOSIS — G629 Polyneuropathy, unspecified: Secondary | ICD-10-CM | POA: Diagnosis not present

## 2023-09-02 DIAGNOSIS — J3800 Paralysis of vocal cords and larynx, unspecified: Secondary | ICD-10-CM | POA: Diagnosis not present

## 2023-09-02 DIAGNOSIS — Z430 Encounter for attention to tracheostomy: Secondary | ICD-10-CM | POA: Diagnosis not present

## 2023-09-02 DIAGNOSIS — I951 Orthostatic hypotension: Secondary | ICD-10-CM | POA: Diagnosis not present

## 2023-09-02 DIAGNOSIS — E039 Hypothyroidism, unspecified: Secondary | ICD-10-CM | POA: Diagnosis not present

## 2023-09-02 DIAGNOSIS — K219 Gastro-esophageal reflux disease without esophagitis: Secondary | ICD-10-CM | POA: Diagnosis not present

## 2023-09-02 DIAGNOSIS — E559 Vitamin D deficiency, unspecified: Secondary | ICD-10-CM | POA: Diagnosis not present

## 2023-09-02 DIAGNOSIS — G20A1 Parkinson's disease without dyskinesia, without mention of fluctuations: Secondary | ICD-10-CM | POA: Diagnosis not present

## 2023-09-06 ENCOUNTER — Encounter: Payer: Self-pay | Admitting: Cardiovascular Disease

## 2023-09-06 DIAGNOSIS — N390 Urinary tract infection, site not specified: Secondary | ICD-10-CM | POA: Diagnosis not present

## 2023-09-06 DIAGNOSIS — R399 Unspecified symptoms and signs involving the genitourinary system: Secondary | ICD-10-CM | POA: Diagnosis not present

## 2023-09-12 DIAGNOSIS — K219 Gastro-esophageal reflux disease without esophagitis: Secondary | ICD-10-CM | POA: Diagnosis not present

## 2023-09-12 DIAGNOSIS — J3800 Paralysis of vocal cords and larynx, unspecified: Secondary | ICD-10-CM | POA: Diagnosis not present

## 2023-09-12 DIAGNOSIS — E039 Hypothyroidism, unspecified: Secondary | ICD-10-CM | POA: Diagnosis not present

## 2023-09-12 DIAGNOSIS — J3802 Paralysis of vocal cords and larynx, bilateral: Secondary | ICD-10-CM | POA: Diagnosis not present

## 2023-09-12 DIAGNOSIS — Z430 Encounter for attention to tracheostomy: Secondary | ICD-10-CM | POA: Diagnosis not present

## 2023-09-12 DIAGNOSIS — G20A1 Parkinson's disease without dyskinesia, without mention of fluctuations: Secondary | ICD-10-CM | POA: Diagnosis not present

## 2023-09-12 DIAGNOSIS — G629 Polyneuropathy, unspecified: Secondary | ICD-10-CM | POA: Diagnosis not present

## 2023-09-12 DIAGNOSIS — M8580 Other specified disorders of bone density and structure, unspecified site: Secondary | ICD-10-CM | POA: Diagnosis not present

## 2023-09-12 DIAGNOSIS — R1314 Dysphagia, pharyngoesophageal phase: Secondary | ICD-10-CM | POA: Diagnosis not present

## 2023-09-12 DIAGNOSIS — Z930 Tracheostomy status: Secondary | ICD-10-CM | POA: Diagnosis not present

## 2023-09-12 DIAGNOSIS — I951 Orthostatic hypotension: Secondary | ICD-10-CM | POA: Diagnosis not present

## 2023-09-12 DIAGNOSIS — R490 Dysphonia: Secondary | ICD-10-CM | POA: Diagnosis not present

## 2023-09-12 DIAGNOSIS — E559 Vitamin D deficiency, unspecified: Secondary | ICD-10-CM | POA: Diagnosis not present

## 2023-09-12 DIAGNOSIS — R0602 Shortness of breath: Secondary | ICD-10-CM | POA: Diagnosis not present

## 2023-09-14 DIAGNOSIS — G20A1 Parkinson's disease without dyskinesia, without mention of fluctuations: Secondary | ICD-10-CM | POA: Diagnosis not present

## 2023-09-14 DIAGNOSIS — M8580 Other specified disorders of bone density and structure, unspecified site: Secondary | ICD-10-CM | POA: Diagnosis not present

## 2023-09-14 DIAGNOSIS — E039 Hypothyroidism, unspecified: Secondary | ICD-10-CM | POA: Diagnosis not present

## 2023-09-14 DIAGNOSIS — E559 Vitamin D deficiency, unspecified: Secondary | ICD-10-CM | POA: Diagnosis not present

## 2023-09-14 DIAGNOSIS — J3800 Paralysis of vocal cords and larynx, unspecified: Secondary | ICD-10-CM | POA: Diagnosis not present

## 2023-09-14 DIAGNOSIS — Z430 Encounter for attention to tracheostomy: Secondary | ICD-10-CM | POA: Diagnosis not present

## 2023-09-14 DIAGNOSIS — K219 Gastro-esophageal reflux disease without esophagitis: Secondary | ICD-10-CM | POA: Diagnosis not present

## 2023-09-14 DIAGNOSIS — I951 Orthostatic hypotension: Secondary | ICD-10-CM | POA: Diagnosis not present

## 2023-09-14 DIAGNOSIS — G629 Polyneuropathy, unspecified: Secondary | ICD-10-CM | POA: Diagnosis not present

## 2023-09-15 DIAGNOSIS — M8580 Other specified disorders of bone density and structure, unspecified site: Secondary | ICD-10-CM | POA: Diagnosis not present

## 2023-09-15 DIAGNOSIS — J3800 Paralysis of vocal cords and larynx, unspecified: Secondary | ICD-10-CM | POA: Diagnosis not present

## 2023-09-15 DIAGNOSIS — I951 Orthostatic hypotension: Secondary | ICD-10-CM | POA: Diagnosis not present

## 2023-09-15 DIAGNOSIS — E559 Vitamin D deficiency, unspecified: Secondary | ICD-10-CM | POA: Diagnosis not present

## 2023-09-15 DIAGNOSIS — G20A1 Parkinson's disease without dyskinesia, without mention of fluctuations: Secondary | ICD-10-CM | POA: Diagnosis not present

## 2023-09-15 DIAGNOSIS — G629 Polyneuropathy, unspecified: Secondary | ICD-10-CM | POA: Diagnosis not present

## 2023-09-15 DIAGNOSIS — Z430 Encounter for attention to tracheostomy: Secondary | ICD-10-CM | POA: Diagnosis not present

## 2023-09-15 DIAGNOSIS — E039 Hypothyroidism, unspecified: Secondary | ICD-10-CM | POA: Diagnosis not present

## 2023-09-15 DIAGNOSIS — K219 Gastro-esophageal reflux disease without esophagitis: Secondary | ICD-10-CM | POA: Diagnosis not present

## 2023-09-20 DIAGNOSIS — M8580 Other specified disorders of bone density and structure, unspecified site: Secondary | ICD-10-CM | POA: Diagnosis not present

## 2023-09-20 DIAGNOSIS — G629 Polyneuropathy, unspecified: Secondary | ICD-10-CM | POA: Diagnosis not present

## 2023-09-20 DIAGNOSIS — Z430 Encounter for attention to tracheostomy: Secondary | ICD-10-CM | POA: Diagnosis not present

## 2023-09-20 DIAGNOSIS — Z1331 Encounter for screening for depression: Secondary | ICD-10-CM | POA: Diagnosis not present

## 2023-09-20 DIAGNOSIS — J398 Other specified diseases of upper respiratory tract: Secondary | ICD-10-CM | POA: Diagnosis not present

## 2023-09-20 DIAGNOSIS — G20A1 Parkinson's disease without dyskinesia, without mention of fluctuations: Secondary | ICD-10-CM | POA: Diagnosis not present

## 2023-09-20 DIAGNOSIS — E559 Vitamin D deficiency, unspecified: Secondary | ICD-10-CM | POA: Diagnosis not present

## 2023-09-20 DIAGNOSIS — Z930 Tracheostomy status: Secondary | ICD-10-CM | POA: Diagnosis not present

## 2023-09-20 DIAGNOSIS — N951 Menopausal and female climacteric states: Secondary | ICD-10-CM | POA: Diagnosis not present

## 2023-09-20 DIAGNOSIS — G20C Parkinsonism, unspecified: Secondary | ICD-10-CM | POA: Diagnosis not present

## 2023-09-20 DIAGNOSIS — J3800 Paralysis of vocal cords and larynx, unspecified: Secondary | ICD-10-CM | POA: Diagnosis not present

## 2023-09-20 DIAGNOSIS — I951 Orthostatic hypotension: Secondary | ICD-10-CM | POA: Diagnosis not present

## 2023-09-20 DIAGNOSIS — K219 Gastro-esophageal reflux disease without esophagitis: Secondary | ICD-10-CM | POA: Diagnosis not present

## 2023-09-20 DIAGNOSIS — Z0000 Encounter for general adult medical examination without abnormal findings: Secondary | ICD-10-CM | POA: Diagnosis not present

## 2023-09-20 DIAGNOSIS — E039 Hypothyroidism, unspecified: Secondary | ICD-10-CM | POA: Diagnosis not present

## 2023-09-20 DIAGNOSIS — F411 Generalized anxiety disorder: Secondary | ICD-10-CM | POA: Diagnosis not present

## 2023-09-20 DIAGNOSIS — Z6821 Body mass index (BMI) 21.0-21.9, adult: Secondary | ICD-10-CM | POA: Diagnosis not present

## 2023-09-21 DIAGNOSIS — Z930 Tracheostomy status: Secondary | ICD-10-CM | POA: Diagnosis not present

## 2023-09-23 DIAGNOSIS — E039 Hypothyroidism, unspecified: Secondary | ICD-10-CM | POA: Diagnosis not present

## 2023-09-23 DIAGNOSIS — E559 Vitamin D deficiency, unspecified: Secondary | ICD-10-CM | POA: Diagnosis not present

## 2023-09-23 DIAGNOSIS — G629 Polyneuropathy, unspecified: Secondary | ICD-10-CM | POA: Diagnosis not present

## 2023-09-23 DIAGNOSIS — M8580 Other specified disorders of bone density and structure, unspecified site: Secondary | ICD-10-CM | POA: Diagnosis not present

## 2023-09-23 DIAGNOSIS — Z430 Encounter for attention to tracheostomy: Secondary | ICD-10-CM | POA: Diagnosis not present

## 2023-09-23 DIAGNOSIS — I951 Orthostatic hypotension: Secondary | ICD-10-CM | POA: Diagnosis not present

## 2023-09-23 DIAGNOSIS — J3800 Paralysis of vocal cords and larynx, unspecified: Secondary | ICD-10-CM | POA: Diagnosis not present

## 2023-09-23 DIAGNOSIS — K219 Gastro-esophageal reflux disease without esophagitis: Secondary | ICD-10-CM | POA: Diagnosis not present

## 2023-09-23 DIAGNOSIS — G20A1 Parkinson's disease without dyskinesia, without mention of fluctuations: Secondary | ICD-10-CM | POA: Diagnosis not present

## 2023-09-28 DIAGNOSIS — I951 Orthostatic hypotension: Secondary | ICD-10-CM | POA: Diagnosis not present

## 2023-09-28 DIAGNOSIS — K219 Gastro-esophageal reflux disease without esophagitis: Secondary | ICD-10-CM | POA: Diagnosis not present

## 2023-09-28 DIAGNOSIS — J3800 Paralysis of vocal cords and larynx, unspecified: Secondary | ICD-10-CM | POA: Diagnosis not present

## 2023-09-28 DIAGNOSIS — G629 Polyneuropathy, unspecified: Secondary | ICD-10-CM | POA: Diagnosis not present

## 2023-09-28 DIAGNOSIS — M8580 Other specified disorders of bone density and structure, unspecified site: Secondary | ICD-10-CM | POA: Diagnosis not present

## 2023-09-28 DIAGNOSIS — G20A1 Parkinson's disease without dyskinesia, without mention of fluctuations: Secondary | ICD-10-CM | POA: Diagnosis not present

## 2023-09-28 DIAGNOSIS — E559 Vitamin D deficiency, unspecified: Secondary | ICD-10-CM | POA: Diagnosis not present

## 2023-09-28 DIAGNOSIS — E039 Hypothyroidism, unspecified: Secondary | ICD-10-CM | POA: Diagnosis not present

## 2023-09-28 DIAGNOSIS — Z430 Encounter for attention to tracheostomy: Secondary | ICD-10-CM | POA: Diagnosis not present

## 2023-09-29 DIAGNOSIS — N390 Urinary tract infection, site not specified: Secondary | ICD-10-CM | POA: Diagnosis not present

## 2023-09-30 DIAGNOSIS — Z430 Encounter for attention to tracheostomy: Secondary | ICD-10-CM | POA: Diagnosis not present

## 2023-09-30 DIAGNOSIS — E039 Hypothyroidism, unspecified: Secondary | ICD-10-CM | POA: Diagnosis not present

## 2023-09-30 DIAGNOSIS — M8580 Other specified disorders of bone density and structure, unspecified site: Secondary | ICD-10-CM | POA: Diagnosis not present

## 2023-09-30 DIAGNOSIS — J3800 Paralysis of vocal cords and larynx, unspecified: Secondary | ICD-10-CM | POA: Diagnosis not present

## 2023-09-30 DIAGNOSIS — G20A1 Parkinson's disease without dyskinesia, without mention of fluctuations: Secondary | ICD-10-CM | POA: Diagnosis not present

## 2023-09-30 DIAGNOSIS — I951 Orthostatic hypotension: Secondary | ICD-10-CM | POA: Diagnosis not present

## 2023-09-30 DIAGNOSIS — G629 Polyneuropathy, unspecified: Secondary | ICD-10-CM | POA: Diagnosis not present

## 2023-09-30 DIAGNOSIS — K219 Gastro-esophageal reflux disease without esophagitis: Secondary | ICD-10-CM | POA: Diagnosis not present

## 2023-09-30 DIAGNOSIS — E559 Vitamin D deficiency, unspecified: Secondary | ICD-10-CM | POA: Diagnosis not present

## 2023-10-05 DIAGNOSIS — K219 Gastro-esophageal reflux disease without esophagitis: Secondary | ICD-10-CM | POA: Diagnosis not present

## 2023-10-05 DIAGNOSIS — E559 Vitamin D deficiency, unspecified: Secondary | ICD-10-CM | POA: Diagnosis not present

## 2023-10-05 DIAGNOSIS — I951 Orthostatic hypotension: Secondary | ICD-10-CM | POA: Diagnosis not present

## 2023-10-05 DIAGNOSIS — E039 Hypothyroidism, unspecified: Secondary | ICD-10-CM | POA: Diagnosis not present

## 2023-10-05 DIAGNOSIS — Z430 Encounter for attention to tracheostomy: Secondary | ICD-10-CM | POA: Diagnosis not present

## 2023-10-05 DIAGNOSIS — G629 Polyneuropathy, unspecified: Secondary | ICD-10-CM | POA: Diagnosis not present

## 2023-10-05 DIAGNOSIS — M8580 Other specified disorders of bone density and structure, unspecified site: Secondary | ICD-10-CM | POA: Diagnosis not present

## 2023-10-05 DIAGNOSIS — J3800 Paralysis of vocal cords and larynx, unspecified: Secondary | ICD-10-CM | POA: Diagnosis not present

## 2023-10-05 DIAGNOSIS — G20A1 Parkinson's disease without dyskinesia, without mention of fluctuations: Secondary | ICD-10-CM | POA: Diagnosis not present

## 2023-10-06 DIAGNOSIS — G20A1 Parkinson's disease without dyskinesia, without mention of fluctuations: Secondary | ICD-10-CM | POA: Diagnosis not present

## 2023-10-06 DIAGNOSIS — K219 Gastro-esophageal reflux disease without esophagitis: Secondary | ICD-10-CM | POA: Diagnosis not present

## 2023-10-06 DIAGNOSIS — I951 Orthostatic hypotension: Secondary | ICD-10-CM | POA: Diagnosis not present

## 2023-10-06 DIAGNOSIS — G629 Polyneuropathy, unspecified: Secondary | ICD-10-CM | POA: Diagnosis not present

## 2023-10-06 DIAGNOSIS — M8580 Other specified disorders of bone density and structure, unspecified site: Secondary | ICD-10-CM | POA: Diagnosis not present

## 2023-10-06 DIAGNOSIS — J3800 Paralysis of vocal cords and larynx, unspecified: Secondary | ICD-10-CM | POA: Diagnosis not present

## 2023-10-06 DIAGNOSIS — Z430 Encounter for attention to tracheostomy: Secondary | ICD-10-CM | POA: Diagnosis not present

## 2023-10-06 DIAGNOSIS — E039 Hypothyroidism, unspecified: Secondary | ICD-10-CM | POA: Diagnosis not present

## 2023-10-06 DIAGNOSIS — E559 Vitamin D deficiency, unspecified: Secondary | ICD-10-CM | POA: Diagnosis not present

## 2023-10-11 DIAGNOSIS — J3800 Paralysis of vocal cords and larynx, unspecified: Secondary | ICD-10-CM | POA: Diagnosis not present

## 2023-10-11 DIAGNOSIS — G20A1 Parkinson's disease without dyskinesia, without mention of fluctuations: Secondary | ICD-10-CM | POA: Diagnosis not present

## 2023-10-11 DIAGNOSIS — E039 Hypothyroidism, unspecified: Secondary | ICD-10-CM | POA: Diagnosis not present

## 2023-10-11 DIAGNOSIS — E559 Vitamin D deficiency, unspecified: Secondary | ICD-10-CM | POA: Diagnosis not present

## 2023-10-11 DIAGNOSIS — Z430 Encounter for attention to tracheostomy: Secondary | ICD-10-CM | POA: Diagnosis not present

## 2023-10-11 DIAGNOSIS — G629 Polyneuropathy, unspecified: Secondary | ICD-10-CM | POA: Diagnosis not present

## 2023-10-11 DIAGNOSIS — K219 Gastro-esophageal reflux disease without esophagitis: Secondary | ICD-10-CM | POA: Diagnosis not present

## 2023-10-11 DIAGNOSIS — M8580 Other specified disorders of bone density and structure, unspecified site: Secondary | ICD-10-CM | POA: Diagnosis not present

## 2023-10-11 DIAGNOSIS — I951 Orthostatic hypotension: Secondary | ICD-10-CM | POA: Diagnosis not present

## 2023-10-12 DIAGNOSIS — E039 Hypothyroidism, unspecified: Secondary | ICD-10-CM | POA: Diagnosis not present

## 2023-10-12 DIAGNOSIS — G20A1 Parkinson's disease without dyskinesia, without mention of fluctuations: Secondary | ICD-10-CM | POA: Diagnosis not present

## 2023-10-12 DIAGNOSIS — Z430 Encounter for attention to tracheostomy: Secondary | ICD-10-CM | POA: Diagnosis not present

## 2023-10-12 DIAGNOSIS — Z0189 Encounter for other specified special examinations: Secondary | ICD-10-CM | POA: Diagnosis not present

## 2023-10-12 DIAGNOSIS — D649 Anemia, unspecified: Secondary | ICD-10-CM | POA: Diagnosis not present

## 2023-10-12 DIAGNOSIS — K219 Gastro-esophageal reflux disease without esophagitis: Secondary | ICD-10-CM | POA: Diagnosis not present

## 2023-10-12 DIAGNOSIS — J3800 Paralysis of vocal cords and larynx, unspecified: Secondary | ICD-10-CM | POA: Diagnosis not present

## 2023-10-12 DIAGNOSIS — M8580 Other specified disorders of bone density and structure, unspecified site: Secondary | ICD-10-CM | POA: Diagnosis not present

## 2023-10-12 DIAGNOSIS — I951 Orthostatic hypotension: Secondary | ICD-10-CM | POA: Diagnosis not present

## 2023-10-12 DIAGNOSIS — G629 Polyneuropathy, unspecified: Secondary | ICD-10-CM | POA: Diagnosis not present

## 2023-10-12 DIAGNOSIS — E559 Vitamin D deficiency, unspecified: Secondary | ICD-10-CM | POA: Diagnosis not present

## 2023-10-12 DIAGNOSIS — N390 Urinary tract infection, site not specified: Secondary | ICD-10-CM | POA: Diagnosis not present

## 2023-10-19 DIAGNOSIS — E039 Hypothyroidism, unspecified: Secondary | ICD-10-CM | POA: Diagnosis not present

## 2023-10-19 DIAGNOSIS — Z430 Encounter for attention to tracheostomy: Secondary | ICD-10-CM | POA: Diagnosis not present

## 2023-10-19 DIAGNOSIS — E559 Vitamin D deficiency, unspecified: Secondary | ICD-10-CM | POA: Diagnosis not present

## 2023-10-19 DIAGNOSIS — M8580 Other specified disorders of bone density and structure, unspecified site: Secondary | ICD-10-CM | POA: Diagnosis not present

## 2023-10-19 DIAGNOSIS — G20A1 Parkinson's disease without dyskinesia, without mention of fluctuations: Secondary | ICD-10-CM | POA: Diagnosis not present

## 2023-10-19 DIAGNOSIS — K219 Gastro-esophageal reflux disease without esophagitis: Secondary | ICD-10-CM | POA: Diagnosis not present

## 2023-10-19 DIAGNOSIS — I951 Orthostatic hypotension: Secondary | ICD-10-CM | POA: Diagnosis not present

## 2023-10-19 DIAGNOSIS — G629 Polyneuropathy, unspecified: Secondary | ICD-10-CM | POA: Diagnosis not present

## 2023-10-19 DIAGNOSIS — J3800 Paralysis of vocal cords and larynx, unspecified: Secondary | ICD-10-CM | POA: Diagnosis not present

## 2023-10-20 DIAGNOSIS — Z430 Encounter for attention to tracheostomy: Secondary | ICD-10-CM | POA: Diagnosis not present

## 2023-10-22 DIAGNOSIS — Z930 Tracheostomy status: Secondary | ICD-10-CM | POA: Diagnosis not present

## 2023-10-28 DIAGNOSIS — M8580 Other specified disorders of bone density and structure, unspecified site: Secondary | ICD-10-CM | POA: Diagnosis not present

## 2023-10-28 DIAGNOSIS — E039 Hypothyroidism, unspecified: Secondary | ICD-10-CM | POA: Diagnosis not present

## 2023-10-28 DIAGNOSIS — K219 Gastro-esophageal reflux disease without esophagitis: Secondary | ICD-10-CM | POA: Diagnosis not present

## 2023-10-28 DIAGNOSIS — G629 Polyneuropathy, unspecified: Secondary | ICD-10-CM | POA: Diagnosis not present

## 2023-10-28 DIAGNOSIS — I951 Orthostatic hypotension: Secondary | ICD-10-CM | POA: Diagnosis not present

## 2023-10-28 DIAGNOSIS — Z430 Encounter for attention to tracheostomy: Secondary | ICD-10-CM | POA: Diagnosis not present

## 2023-10-28 DIAGNOSIS — E559 Vitamin D deficiency, unspecified: Secondary | ICD-10-CM | POA: Diagnosis not present

## 2023-10-28 DIAGNOSIS — G20A1 Parkinson's disease without dyskinesia, without mention of fluctuations: Secondary | ICD-10-CM | POA: Diagnosis not present

## 2023-10-28 DIAGNOSIS — J3800 Paralysis of vocal cords and larynx, unspecified: Secondary | ICD-10-CM | POA: Diagnosis not present

## 2023-11-01 DIAGNOSIS — J3802 Paralysis of vocal cords and larynx, bilateral: Secondary | ICD-10-CM | POA: Diagnosis not present

## 2023-11-01 DIAGNOSIS — Z930 Tracheostomy status: Secondary | ICD-10-CM | POA: Diagnosis not present

## 2023-11-02 DIAGNOSIS — G232 Striatonigral degeneration: Secondary | ICD-10-CM | POA: Diagnosis not present

## 2023-11-02 DIAGNOSIS — I951 Orthostatic hypotension: Secondary | ICD-10-CM | POA: Diagnosis not present

## 2023-11-02 DIAGNOSIS — G912 (Idiopathic) normal pressure hydrocephalus: Secondary | ICD-10-CM | POA: Diagnosis not present

## 2023-11-10 DIAGNOSIS — N3000 Acute cystitis without hematuria: Secondary | ICD-10-CM | POA: Diagnosis not present

## 2023-11-10 DIAGNOSIS — R300 Dysuria: Secondary | ICD-10-CM | POA: Diagnosis not present

## 2023-11-11 ENCOUNTER — Encounter: Payer: Self-pay | Admitting: Adult Health

## 2023-11-11 ENCOUNTER — Ambulatory Visit: Admitting: Adult Health

## 2023-11-11 DIAGNOSIS — F411 Generalized anxiety disorder: Secondary | ICD-10-CM

## 2023-11-11 NOTE — Progress Notes (Signed)
 [name] [id number] [birth year] 73 y.o.  Subjective:   Patient ID:  Morgan Reese is a 73 y.o. (DOB [birth year]) female.  Chief Complaint: No chief complaint on file.   HPI KRISTYNA BRADSTREET presents to the office today for follow-up of anxiety disorder.  Accompanied by husband.  Describes mood today as ok. Pleasant. Reports occasional tearfulness. Mood symptoms - reports some depression. Reports varying interest and motivation. Reports feeling anxious. Denies irritability. Reports occasional panic attacks. Reports some worry, rumination and over thinking. Reports mood as stable. Stating I feel like I'm doing as good as I can be. Feels like medications are helpful - Gabapentin . Taking medications as prescribed. Energy levels lower. Active, does not a regular exercise routine. Enjoys some usual interests and activities. Married - lives with husband. Has 2 adult children. Spending time with family.  Appetite adequate. Weight stable - 120 pounds. Sleeps well most nights. Averages 6 hours. Reports evening napping for an hour. Focus and concentration stable. Completing tasks. Managing aspects of household. Retired. Denies SI or HI.  Denies AH or VH. Denies self harm. Denies substance use. Denies paranoia.   Flowsheet Row ED from 05/10/2023 in [facility] Emergency Department at [facility] ED from 04/21/2022 in [facility] Emergency Department at [facility] ED from 02/07/2022 in [facility] Emergency Department at [facility]  C-SSRS RISK CATEGORY No Risk No Risk No Risk     Review of Systems:  Review of Systems  Musculoskeletal:  Negative for gait problem.  Neurological:  Negative for tremors.  Psychiatric/Behavioral:         Please refer to HPI    Medications: I have reviewed the patient's current medications.  Current Outpatient Medications  Medication Sig Dispense Refill   Ascorbic Acid (VITAMIN C) 1000 MG tablet Take 1,000 mg by mouth  daily.     Cholecalciferol (VITAMIN D3 PO) Take by mouth. Alternates 10,000 units with 5,000 units each day.     COPPER PO Take 30 mg by mouth 2 (two) times a week.     estradiol  (ESTRACE ) 0.1 MG/GM vaginal cream Place 1 Applicatorful vaginally at bedtime.     estradiol  (VIVELLE -DOT) 0.0375 MG/24HR Place 1 patch onto the skin 2 (two) times a week.     gabapentin  (NEURONTIN ) 300 MG capsule Take 1 capsule (300 mg total) by mouth 3 (three) times daily. 270 capsule 3   levothyroxine  (SYNTHROID ) 50 MCG tablet Take 50 mcg by mouth daily before breakfast.     Magnesium Citrate 200 MG TABS Take 600 mg by mouth daily.     melatonin 3 MG TABS tablet Take 6-9 mg by mouth at bedtime.     NON FORMULARY Apply 2 % topically daily.     Omega-3 Fatty Acids (FISH OIL) 1000 MG CAPS Take 1 capsule by mouth at bedtime.     progesterone  (PROMETRIUM ) 200 MG capsule Take 200 mg by mouth daily.     pyridOXINE  (B-6) 50 MG tablet Take 50 mg by mouth daily.     rasagiline  (AZILECT ) 1 MG TABS tablet Take 1 tablet (1 mg total) by mouth daily. 90 tablet 1   thiamine 100 MG tablet Take 100 mg by mouth daily.     VITAMIN K PO Take by mouth daily. MK7     zinc  gluconate 50 MG tablet Take 50 mg by mouth daily.     No current facility-administered medications for this visit.    Medication Side Effects: None  Allergies:  Allergies  Allergen  Reactions   Amoxicillin Swelling and Other (See Comments)    REACTION: Head felt over-heated  Other reaction(s): Other (See Comments)  REACTION: Head felt over-heated    REACTION: Head felt over-heated Other reaction(s): Other (See Comments) REACTION: Head felt over-heated   Gluten Meal Other (See Comments)   Latex Other (See Comments)    Skin irritation   Serotonin Other (See Comments)    Other reaction(s): hyponatremia SSRI's are contraindicated, causes hyponatremia    SSRI's are contraindicated, causes hyponatremia  Other reaction(s): hyponatremia SSRI's are  contraindicated, causes hyponatremia  Other reaction(s): hyponatremia SSRI's are contraindicated, causes hyponatremia  SSRI's are contraindicated, causes hyponatremia  Other reaction(s): hyponatremia SSRI's are contraindicated, causes hyponatremia   Serotonin Reuptake Inhibitors (Ssris) Other (See Comments)    Other reaction(s): hyponatremia SSRI's are contraindicated, causes hyponatremia     Past Medical History:  Diagnosis Date   Hypertension    Multiple system atrophy (HCC)    Parkinson's disease (HCC)    Sinus congestion    Thyroid  disease     Past Medical History, Surgical history, Social history, and Family history were reviewed and updated as appropriate.   Please see review of systems for further details on the patient's review from today.   Objective:   Physical Exam:  There were no vitals taken for this visit.  Physical Exam Constitutional:      General: She is not in acute distress.  Musculoskeletal:        General: No deformity.   Neurological:     Mental Status: She is alert and oriented to person, place, and time.     Coordination: Coordination normal.   Psychiatric:        Attention and Perception: Attention and perception normal. She does not perceive auditory or visual hallucinations.        Mood and Affect: Mood normal. Mood is not anxious or depressed. Affect is not labile, blunt, angry or inappropriate.        Speech: Speech normal.        Behavior: Behavior normal.        Thought Content: Thought content normal. Thought content is not paranoid or delusional. Thought content does not include homicidal or suicidal ideation. Thought content does not include homicidal or suicidal plan.        Cognition and Memory: Cognition and memory normal.        Judgment: Judgment normal.     Comments: Insight intact     Lab Review:     Component Value Date/Time   NA 133 (L) 05/10/2023 1418   K 3.8 05/10/2023 1418   CL 98 05/10/2023 1418   CO2 27 05/10/2023  1418   GLUCOSE 98 05/10/2023 1418   BUN 16 05/10/2023 1418   CREATININE 0.50 05/10/2023 1418   CALCIUM 9.2 05/10/2023 1418   PROT 6.2 (L) 04/21/2022 1410   ALBUMIN 4.0 04/21/2022 1410   AST 25 04/21/2022 1410   ALT 29 04/21/2022 1410   ALKPHOS 59 04/21/2022 1410   BILITOT 0.5 04/21/2022 1410   GFRNONAA >60 05/10/2023 1418       Component Value Date/Time   WBC 4.9 05/10/2023 1418   RBC 4.37 05/10/2023 1418   HGB 13.3 05/10/2023 1418   HCT 39.3 05/10/2023 1418   PLT 206 05/10/2023 1418   MCV 89.9 05/10/2023 1418   MCH 30.4 05/10/2023 1418   MCHC 33.8 05/10/2023 1418   RDW 13.1 05/10/2023 1418   LYMPHSABS 1.0 05/10/2023 1418   MONOABS 0.4  05/10/2023 1418   EOSABS 0.0 05/10/2023 1418   BASOSABS 0.0 05/10/2023 1418    No results found for: POCLITH, LITHIUM   No results found for: PHENYTOIN, PHENOBARB, VALPROATE, CBMZ   .res Assessment: Plan:    Plan:  Working with [facility] neurologist   Gabapentin  300mg  TID - anxiety  PDMP reviewed  RTC 6 months  25 minutes spent dedicated to the care of this patient on the date of this encounter to include pre-visit review of records, ordering of medication, post visit documentation, and face-to-face time with the patient discussing GAD. Discussed continuing current medication regimen.  Diagnoses and all orders for this visit:  Generalized anxiety disorder    Please see After Visit Summary for patient specific instructions.  No future appointments.  No orders of the defined types were placed in this encounter.   -------------------------------

## 2023-11-16 DIAGNOSIS — G894 Chronic pain syndrome: Secondary | ICD-10-CM | POA: Diagnosis not present

## 2023-11-16 DIAGNOSIS — M792 Neuralgia and neuritis, unspecified: Secondary | ICD-10-CM | POA: Diagnosis not present

## 2023-11-16 DIAGNOSIS — G5732 Lesion of lateral popliteal nerve, left lower limb: Secondary | ICD-10-CM | POA: Diagnosis not present

## 2023-11-18 DIAGNOSIS — R061 Stridor: Secondary | ICD-10-CM | POA: Diagnosis not present

## 2023-11-18 DIAGNOSIS — R1310 Dysphagia, unspecified: Secondary | ICD-10-CM | POA: Diagnosis not present

## 2023-11-18 DIAGNOSIS — G20C Parkinsonism, unspecified: Secondary | ICD-10-CM | POA: Diagnosis not present

## 2023-11-21 DIAGNOSIS — Z430 Encounter for attention to tracheostomy: Secondary | ICD-10-CM | POA: Diagnosis not present

## 2023-11-21 DIAGNOSIS — Z930 Tracheostomy status: Secondary | ICD-10-CM | POA: Diagnosis not present

## 2023-11-21 NOTE — Progress Notes (Signed)
 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  She is accompanied today by her husband.  The patient is concurrently followed by the movement disorder team at [facility].  Since the last visit, she had severe stridor related to bilateral vocal cord paralysis and subsequently had a tracheostomy placed. She reports no significant change in her condition following the tracheostomy placement.   She believes that her current medication, gabapentin , is causing fatigue and has expressed a desire to discontinue its use. Despite taking gabapentin , she experiences a pins-and-needles sensation. She has been taking 3 doses of gabapentin  300 mg daily, which she believes may be causing dizziness. She has attempted to adjust the timing of her medication, taking 2 doses before bedtime, but this resulted in headaches.  She has been evaluated with EMG on 05/04/2022 by [facility] neurologist Dr. Tobie.  This study reportedly revealed no evidence of length-dependent peripheral neuropathy.    Additionally, she reports intermittent lower back pain.   For her atypical Parkinson's disease, she is prescribed Mirapex  0.5 mg, taking 2 tablets in the morning, 1 in the afternoon, and 2 in the evening. She reports no side effects from this medication.  She is scheduled to meet with neurosurgery on 11/21/2023 to discuss potential surgery for possible normal pressure hydrocephalus (NPH).  There has been conflicting interpretation of her brain imaging that does reveal ventriculomegaly with some degree of atrophy.  She had an LP and the patient did not believe she was symptomatically better following high-volume tap, however, her physical therapist thought her gait had improved.  She recently met with Dr. Elayne and he felt it was reasonable to consider shunt placement since the alternative neurologic diagnosis of MSA  was not typically responsive to treatment.  We do agree that her symptoms do not clearly conform to either diagnosis.  She has had a positive DaTscan  suggesting a diagnosis in the Parkinson's family.  We corresponded through MyChart on multiple occasions since her last visit.  Following her tracheostomy she developed severe peripheral edema.  She is pleased that with pneumatic compression device she has purchased and compression socks her edema has markedly improved.  She requires a walker for mobility and experiences tremors. Her sleep quality has improved since the tracheostomy, and she no longer snores.  INTERVAL: Since last visit, she had a tracheostomy placed due to severe stridor related to bilateral vocal cord paralysis.  SOCIAL HISTORY:   Hobbies: Camping   Sleep: Improved sleep quality since tracheostomy, no longer snores  MEDICATIONS CURRENT MEDS: Mirapex  0.5 mg Oral 2 in the morning, 1 in the afternoon, 2 in the evening Gabapentin  300 mg Oral 3 times a day PREVIOUS MEDS: Carbidopa /Levodopa   Reason for Discontinuation: Unable to tolerate    Past Medical History, Past Surgery History, Allergies, Social History, and Family History were reviewed and updated as appropriate. .    Objective:   Physical Exam General Appearance: Swelling looks really good.   General  General Appearance - Cooperative. Not in acute distress.   Chest and Lung Exam  Inspection:  Chest Wall: - Normal.  Shape - Normal and Symmetric. Movements - Symmetrical. Accessory muscles - No use of accessory muscles in breathing.  Auscultation:  Breath sounds: - Normal.  Adventitious sounds: - No Adventitious sounds.   Cardiovascular  Inspection: Carotid artery - Bilateral - Inspection Normal.  Auscultation: Rhythm - Regular. Heart Sounds - S1 WNL and  S2 WNL. No S3.  Murmurs & Other Heart Sounds: Auscultation of the heart reveals - No Murmurs.  Carotid arteries - No Carotid bruit.  Neurologic: The  patient is alert and engaged.  Tracheostomy in place.  No tremor.  Gait not assessed.  She is in a wheelchair.  Assessment / Plan:   Assessment & Plan 1. Atypical Parkinson's disease/probable multisystem atrophy. She is currently on Mirapex  0.5 mg, taking 2 in the morning, 1 in the afternoon, and 2 in the evening. She was unable to tolerate carbidopa /levodopa . She reports dizziness, which may be related to the timing of her Mirapex  doses. It is recommended to continue her current Mirapex  regimen and monitor for any side effects. If dizziness persists, adjusting the timing of the doses may be considered.  A significant portion of her appointment today was spent discussing the possible diagnosis of normal pressure hydrocephalus.  After discussion with Dr. Elayne she is planning to proceed with a consultation with neurosurgery and VP shunt placement.  2. Bilateral vocal cord paralysis. She had a tracheostomy placed due to severe stridor related to bilateral vocal cord paralysis. She reports no significant improvement in her breathing since the tracheostomy. Continued monitoring of her respiratory status is advised.  3. Chronic irritated right S1 root. EMG results indicate a chronic irritated right S1 root, attributed to back disease, rather than peripheral neuropathy. Gabapentin  is a reasonable treatment option for her nerve pain. She is advised to reduce her gabapentin  dosage from 3 times daily to twice daily for a week, then once daily for another week, and finally attempt to discontinue it. If she experiences increased pain or other negative effects, she may resume the medication.  4. Normal pressure hydrocephalus (NPH). She is considering NPH surgery and will meet with Dr. Vona  on 12/21/2023 to discuss the procedure. The potential benefits and risks of the surgery have been discussed, and it is recommended to proceed if deemed appropriate by Dr. Vona  Follow-up The patient will  follow up in 6 months.  Encounter Diagnoses  Code Name Primary?  . 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 R06.1 Stridor   . R13.10 Dysphagia, unspecified type   [employer] banker, operational was utilized in the composition of this office note.  Informed consent was obtained.

## 2023-11-29 DIAGNOSIS — S199XXA Unspecified injury of neck, initial encounter: Secondary | ICD-10-CM | POA: Diagnosis not present

## 2023-11-29 DIAGNOSIS — S82201A Unspecified fracture of shaft of right tibia, initial encounter for closed fracture: Secondary | ICD-10-CM | POA: Diagnosis not present

## 2023-11-29 DIAGNOSIS — G319 Degenerative disease of nervous system, unspecified: Secondary | ICD-10-CM | POA: Diagnosis not present

## 2023-11-29 DIAGNOSIS — S82831A Other fracture of upper and lower end of right fibula, initial encounter for closed fracture: Secondary | ICD-10-CM | POA: Diagnosis not present

## 2023-11-29 DIAGNOSIS — G20A1 Parkinson's disease without dyskinesia, without mention of fluctuations: Secondary | ICD-10-CM | POA: Diagnosis not present

## 2023-11-29 DIAGNOSIS — S0990XA Unspecified injury of head, initial encounter: Secondary | ICD-10-CM | POA: Diagnosis not present

## 2023-11-29 DIAGNOSIS — W108XXA Fall (on) (from) other stairs and steps, initial encounter: Secondary | ICD-10-CM | POA: Diagnosis not present

## 2023-11-29 DIAGNOSIS — M50322 Other cervical disc degeneration at C5-C6 level: Secondary | ICD-10-CM | POA: Diagnosis not present

## 2023-11-29 DIAGNOSIS — Z1152 Encounter for screening for COVID-19: Secondary | ICD-10-CM | POA: Diagnosis not present

## 2023-11-29 DIAGNOSIS — R531 Weakness: Secondary | ICD-10-CM | POA: Diagnosis not present

## 2023-11-29 DIAGNOSIS — M4312 Spondylolisthesis, cervical region: Secondary | ICD-10-CM | POA: Diagnosis not present

## 2023-11-29 DIAGNOSIS — I6782 Cerebral ischemia: Secondary | ICD-10-CM | POA: Diagnosis not present

## 2023-11-29 DIAGNOSIS — S82301A Unspecified fracture of lower end of right tibia, initial encounter for closed fracture: Secondary | ICD-10-CM | POA: Diagnosis not present

## 2023-12-01 ENCOUNTER — Inpatient Hospital Stay (HOSPITAL_COMMUNITY)
Admission: AD | Admit: 2023-12-01 | Discharge: 2023-12-09 | DRG: 493 | Disposition: A | Discharge status: Skilled Nursing Facility | Source: Other Acute Inpatient Hospital | Attending: Internal Medicine | Admitting: Internal Medicine

## 2023-12-01 ENCOUNTER — Encounter (HOSPITAL_COMMUNITY): Payer: Self-pay

## 2023-12-01 ENCOUNTER — Inpatient Hospital Stay (HOSPITAL_COMMUNITY)

## 2023-12-01 ENCOUNTER — Other Ambulatory Visit: Payer: Self-pay

## 2023-12-01 DIAGNOSIS — Z91040 Latex allergy status: Secondary | ICD-10-CM

## 2023-12-01 DIAGNOSIS — Z01818 Encounter for other preprocedural examination: Secondary | ICD-10-CM | POA: Diagnosis not present

## 2023-12-01 DIAGNOSIS — J383 Other diseases of vocal cords: Secondary | ICD-10-CM | POA: Diagnosis not present

## 2023-12-01 DIAGNOSIS — B9620 Unspecified Escherichia coli [E. coli] as the cause of diseases classified elsewhere: Secondary | ICD-10-CM | POA: Diagnosis not present

## 2023-12-01 DIAGNOSIS — Z4789 Encounter for other orthopedic aftercare: Secondary | ICD-10-CM | POA: Diagnosis not present

## 2023-12-01 DIAGNOSIS — G20A1 Parkinson's disease without dyskinesia, without mention of fluctuations: Secondary | ICD-10-CM | POA: Diagnosis present

## 2023-12-01 DIAGNOSIS — Z7401 Bed confinement status: Secondary | ICD-10-CM | POA: Diagnosis not present

## 2023-12-01 DIAGNOSIS — G20C Parkinsonism, unspecified: Secondary | ICD-10-CM | POA: Diagnosis not present

## 2023-12-01 DIAGNOSIS — G919 Hydrocephalus, unspecified: Secondary | ICD-10-CM | POA: Diagnosis present

## 2023-12-01 DIAGNOSIS — R471 Dysarthria and anarthria: Secondary | ICD-10-CM | POA: Diagnosis not present

## 2023-12-01 DIAGNOSIS — Z888 Allergy status to other drugs, medicaments and biological substances status: Secondary | ICD-10-CM

## 2023-12-01 DIAGNOSIS — Z801 Family history of malignant neoplasm of trachea, bronchus and lung: Secondary | ICD-10-CM

## 2023-12-01 DIAGNOSIS — S82291A Other fracture of shaft of right tibia, initial encounter for closed fracture: Secondary | ICD-10-CM | POA: Diagnosis not present

## 2023-12-01 DIAGNOSIS — Z993 Dependence on wheelchair: Secondary | ICD-10-CM

## 2023-12-01 DIAGNOSIS — E039 Hypothyroidism, unspecified: Secondary | ICD-10-CM | POA: Diagnosis not present

## 2023-12-01 DIAGNOSIS — G839 Paralytic syndrome, unspecified: Secondary | ICD-10-CM | POA: Diagnosis present

## 2023-12-01 DIAGNOSIS — S82201A Unspecified fracture of shaft of right tibia, initial encounter for closed fracture: Secondary | ICD-10-CM | POA: Diagnosis not present

## 2023-12-01 DIAGNOSIS — S82241A Displaced spiral fracture of shaft of right tibia, initial encounter for closed fracture: Secondary | ICD-10-CM | POA: Diagnosis not present

## 2023-12-01 DIAGNOSIS — R0989 Other specified symptoms and signs involving the circulatory and respiratory systems: Secondary | ICD-10-CM | POA: Diagnosis present

## 2023-12-01 DIAGNOSIS — Z930 Tracheostomy status: Secondary | ICD-10-CM | POA: Diagnosis not present

## 2023-12-01 DIAGNOSIS — K5900 Constipation, unspecified: Secondary | ICD-10-CM | POA: Diagnosis present

## 2023-12-01 DIAGNOSIS — S82831A Other fracture of upper and lower end of right fibula, initial encounter for closed fracture: Secondary | ICD-10-CM | POA: Diagnosis not present

## 2023-12-01 DIAGNOSIS — Z833 Family history of diabetes mellitus: Secondary | ICD-10-CM | POA: Diagnosis not present

## 2023-12-01 DIAGNOSIS — M6281 Muscle weakness (generalized): Secondary | ICD-10-CM | POA: Diagnosis not present

## 2023-12-01 DIAGNOSIS — S82301A Unspecified fracture of lower end of right tibia, initial encounter for closed fracture: Secondary | ICD-10-CM | POA: Diagnosis not present

## 2023-12-01 DIAGNOSIS — S82209A Unspecified fracture of shaft of unspecified tibia, initial encounter for closed fracture: Principal | ICD-10-CM | POA: Diagnosis present

## 2023-12-01 DIAGNOSIS — W19XXXA Unspecified fall, initial encounter: Secondary | ICD-10-CM | POA: Diagnosis not present

## 2023-12-01 DIAGNOSIS — I10 Essential (primary) hypertension: Secondary | ICD-10-CM | POA: Diagnosis not present

## 2023-12-01 DIAGNOSIS — Z79899 Other long term (current) drug therapy: Secondary | ICD-10-CM

## 2023-12-01 DIAGNOSIS — Z9889 Other specified postprocedural states: Secondary | ICD-10-CM | POA: Diagnosis not present

## 2023-12-01 DIAGNOSIS — S82401D Unspecified fracture of shaft of right fibula, subsequent encounter for closed fracture with routine healing: Secondary | ICD-10-CM | POA: Diagnosis not present

## 2023-12-01 DIAGNOSIS — Z79890 Hormone replacement therapy (postmenopausal): Secondary | ICD-10-CM | POA: Diagnosis not present

## 2023-12-01 DIAGNOSIS — Z823 Family history of stroke: Secondary | ICD-10-CM

## 2023-12-01 DIAGNOSIS — M21371 Foot drop, right foot: Secondary | ICD-10-CM | POA: Diagnosis present

## 2023-12-01 DIAGNOSIS — S82401A Unspecified fracture of shaft of right fibula, initial encounter for closed fracture: Secondary | ICD-10-CM | POA: Diagnosis not present

## 2023-12-01 DIAGNOSIS — J3800 Paralysis of vocal cords and larynx, unspecified: Secondary | ICD-10-CM | POA: Diagnosis not present

## 2023-12-01 DIAGNOSIS — S82101D Unspecified fracture of upper end of right tibia, subsequent encounter for closed fracture with routine healing: Secondary | ICD-10-CM | POA: Diagnosis not present

## 2023-12-01 DIAGNOSIS — S82391A Other fracture of lower end of right tibia, initial encounter for closed fracture: Secondary | ICD-10-CM | POA: Diagnosis not present

## 2023-12-01 DIAGNOSIS — S82409A Unspecified fracture of shaft of unspecified fibula, initial encounter for closed fracture: Secondary | ICD-10-CM | POA: Diagnosis present

## 2023-12-01 DIAGNOSIS — H5702 Anisocoria: Secondary | ICD-10-CM | POA: Diagnosis present

## 2023-12-01 DIAGNOSIS — Z880 Allergy status to penicillin: Secondary | ICD-10-CM | POA: Diagnosis not present

## 2023-12-01 DIAGNOSIS — Z91018 Allergy to other foods: Secondary | ICD-10-CM

## 2023-12-01 DIAGNOSIS — Z818 Family history of other mental and behavioral disorders: Secondary | ICD-10-CM

## 2023-12-01 DIAGNOSIS — G912 (Idiopathic) normal pressure hydrocephalus: Secondary | ICD-10-CM | POA: Diagnosis not present

## 2023-12-01 DIAGNOSIS — R531 Weakness: Secondary | ICD-10-CM | POA: Diagnosis not present

## 2023-12-01 DIAGNOSIS — S82101A Unspecified fracture of upper end of right tibia, initial encounter for closed fracture: Secondary | ICD-10-CM | POA: Diagnosis not present

## 2023-12-01 DIAGNOSIS — N390 Urinary tract infection, site not specified: Secondary | ICD-10-CM | POA: Diagnosis not present

## 2023-12-01 LAB — TYPE AND SCREEN
ABO/RH(D): O POS
Antibody Screen: NEGATIVE

## 2023-12-01 LAB — SURGICAL PCR SCREEN
MRSA, PCR: NEGATIVE
Staphylococcus aureus: NEGATIVE

## 2023-12-01 LAB — BASIC METABOLIC PANEL WITH GFR
Anion gap: 10 (ref 5–15)
BUN: 18 mg/dL (ref 8–23)
CO2: 22 mmol/L (ref 22–32)
Calcium: 8.8 mg/dL — ABNORMAL LOW (ref 8.9–10.3)
Chloride: 104 mmol/L (ref 98–111)
Creatinine, Ser: 0.49 mg/dL (ref 0.44–1.00)
GFR, Estimated: >60 mL/min (ref >60)
Glucose, Bld: 82 mg/dL (ref 70–99)
Potassium: 3.8 mmol/L (ref 3.5–5.1)
Sodium: 136 mmol/L (ref 135–145)

## 2023-12-01 LAB — CBC
HCT: 35.2 % — ABNORMAL LOW (ref 36.0–46.0)
Hemoglobin: 11.6 g/dL — ABNORMAL LOW (ref 12.0–15.0)
MCH: 30.8 pg (ref 26.0–34.0)
MCHC: 33 g/dL (ref 30.0–36.0)
MCV: 93.4 fL (ref 80.0–100.0)
Platelets: 142 K/uL — ABNORMAL LOW (ref 150–400)
RBC: 3.77 MIL/uL — ABNORMAL LOW (ref 3.87–5.11)
RDW: 13.6 % (ref 11.5–15.5)
WBC: 4.1 K/uL (ref 4.0–10.5)
nRBC: 0 % (ref 0.0–0.2)

## 2023-12-01 LAB — PROTIME-INR
INR: 1.1 (ref 0.8–1.2)
Prothrombin Time: 14.9 s (ref 11.4–15.2)

## 2023-12-01 MED ORDER — PROGESTERONE MICRONIZED 100 MG PO CAPS
200.0000 mg | ORAL_CAPSULE | Freq: Every day | ORAL | Status: DC
  Administered 2023-12-01 – 2023-12-03 (×2): 200 mg via ORAL
  Administered 2023-12-04: 100 mg via ORAL
  Administered 2023-12-05 – 2023-12-06 (×2): 200 mg via ORAL
  Administered 2023-12-07: 100 mg via ORAL
  Administered 2023-12-08: 200 mg via ORAL
  Filled 2023-12-01 (×11): qty 2

## 2023-12-01 MED ORDER — PRAMIPEXOLE DIHYDROCHLORIDE 1 MG PO TABS
1.0000 mg | ORAL_TABLET | Freq: Three times a day (TID) | ORAL | Status: DC
  Administered 2023-12-01 – 2023-12-09 (×23): 1 mg via ORAL
  Filled 2023-12-01 (×23): qty 1

## 2023-12-01 MED ORDER — MORPHINE SULFATE (PF) 2 MG/ML IV SOLN: 0.5000 mg | INTRAVENOUS | Status: DC | PRN

## 2023-12-01 MED ORDER — SENNOSIDES-DOCUSATE SODIUM 8.6-50 MG PO TABS
1.0000 | ORAL_TABLET | Freq: Two times a day (BID) | ORAL | Status: DC
  Administered 2023-12-01 – 2023-12-09 (×14): 1 via ORAL
  Filled 2023-12-01 (×16): qty 1

## 2023-12-01 MED ORDER — GABAPENTIN 300 MG PO CAPS
300.0000 mg | ORAL_CAPSULE | Freq: Three times a day (TID) | ORAL | Status: DC
  Administered 2023-12-01 – 2023-12-09 (×23): 300 mg via ORAL
  Filled 2023-12-01 (×24): qty 1

## 2023-12-01 MED ORDER — OXYCODONE HCL 5 MG PO TABS
5.0000 mg | ORAL_TABLET | ORAL | Status: DC | PRN
  Administered 2023-12-01 – 2023-12-02 (×2): 5 mg via ORAL
  Filled 2023-12-01 (×2): qty 1

## 2023-12-01 MED ORDER — MUPIROCIN 2 % EX OINT
1.0000 | TOPICAL_OINTMENT | Freq: Two times a day (BID) | CUTANEOUS | Status: AC
  Administered 2023-12-01 – 2023-12-05 (×7): 1 via NASAL
  Filled 2023-12-01 (×2): qty 22

## 2023-12-01 MED ORDER — LEVOTHYROXINE SODIUM 50 MCG PO TABS
50.0000 ug | ORAL_TABLET | Freq: Every day | ORAL | Status: DC
  Administered 2023-12-02 – 2023-12-09 (×8): 50 ug via ORAL
  Filled 2023-12-01 (×8): qty 1

## 2023-12-01 MED ORDER — ENOXAPARIN SODIUM 40 MG/0.4ML IJ SOSY
40.0000 mg | PREFILLED_SYRINGE | INTRAMUSCULAR | Status: DC
  Administered 2023-12-01 – 2023-12-08 (×7): 40 mg via SUBCUTANEOUS
  Filled 2023-12-01 (×7): qty 0.4

## 2023-12-01 NOTE — Progress Notes (Signed)
 Patient arrived to unit with husband and friend at bedside. She is alert and communicating with staff. MD at bedside, call bell within reach and oriented to room, and use of call bell. Bed in low position, wheels locked.

## 2023-12-01 NOTE — Consult Note (Addendum)
 ORTHOPAEDIC CONSULTATION  REQUESTING PHYSICIAN: Claudene Maximino LABOR, MD  Chief Complaint: Right leg pain  HPI: Morgan Reese is a 73 y.o. female with a history of atypical Parkinson's, vocal chord paralysis with tracheostomy, HTN, and multiple system atrophy who complains of acute right leg pain.  Two days ago patient was walking in a camper in [place]  when she felt her leg give out from under her.  Twisted right leg and fell down.  Noticed immediate pain to right leg, ankle, and knee.  FMs noticed the foot was twisted almost backwards and tried to straighten it out.  She was evaluated at a local emergency room and was determined to have a tibial shaft fracture.  Patient was offered to be transferred to a trauma center for surgery in [facility] [place], or to stabilize the fracture and get home for further treatment/management.  Patient stated they were encouraged to try for the latter option, having been informed recovery and therapy would be difficult and time-consuming.  Patient and her family drove home and presented to the [facility] Orthopedic office today for further evaluation.  She has been non-weight bearing since the ER in [place]  in a long leg splint.  Denies any new numbness or tingling, though endorses decreased sensation to her right foot at baseline, in addition to dropfoot of the left leg at baseline too.  FMs present have had difficulty managing her mobility and overall needs due to this new injury and do not feel they can provide adequate care at home while waiting for surgery.  Patient was then direct admitted to the hospital with plan for surgical fixation.  Significant Hx: atypical Parkinson's described as progressing rapidly in the last two years (per FM, when she tries to stand she's described as shaking uncontrollably due to her Parkinson's), tracheostomy since March 2025 due to severe vocal chord paralysis and inability to maintain airway, and constant fluctuations in her  blood pressure management reportedly neurological in origin.     Past Medical History:  Diagnosis Date   Hypertension    Multiple system atrophy (HCC)    Parkinson's disease (HCC)    Sinus congestion    Thyroid  disease    Past Surgical History:  Procedure Laterality Date   CATARACT EXTRACTION, BILATERAL     NASAL SINUS SURGERY     TUBAL LIGATION     Social History   Socioeconomic History   Marital status: Married    Spouse name: Not on file   Number of children: 2   Years of education: Not on file   Highest education level: Not on file  Occupational History   Occupation: retired    Comment: PhD; early childhood education  Tobacco Use   Smoking status: Never   Smokeless tobacco: Never  Vaping Use   Vaping status: Never Used  Substance and Sexual Activity   Alcohol use: Never   Drug use: Never   Sexual activity: Not on file  Other Topics Concern   Not on file  Social History Narrative   Right handed   Caffeine: none    Lives in a two story home with main bedroom on first floor.    Social Drivers of Corporate investment banker Strain: Low Risk  (11/15/2023)   Received from [facility]   Overall Financial Resource Strain (CARDIA)    Difficulty of Paying Living Expenses: Not hard at all  Food Insecurity: No Food Insecurity (12/01/2023)   Hunger Vital Sign    Worried About Running  Out of Food in the Last Year: Never true    Ran Out of Food in the Last Year: Never true  Transportation Needs: No Transportation Needs (12/01/2023)   PRAPARE - Administrator, Civil Service (Medical): No    Lack of Transportation (Non-Medical): No  Physical Activity: Inactive (11/15/2023)   [facility]   Exercise Vital Sign    On average, how many days per week do you engage in moderate to strenuous exercise (like a brisk walk)?: 0 days    On average, how many minutes do you engage in exercise at this level?: 30 min  Stress: No Stress Concern Present  (11/15/2023)   Received from [facility] - Occupational Stress Questionnaire    Feeling of Stress : Only a little  Social Connections: Moderately Isolated (12/01/2023)   Social Connection and Isolation Panel    Frequency of Communication with Friends and Family: More than three times a week    Frequency of Social Gatherings with Friends and Family: Three times a week    Attends Religious Services: Never    Active Member of Clubs or Organizations: No    Attends Engineer, structural: Never    Marital Status: Married   Family History  Problem Relation Age of Onset   Tremor Mother    Depression Mother    Cancer Father        Lung Cancer   Tremor Maternal Grandmother    Diabetes Paternal Grandmother    Stroke Paternal Uncle    Healthy Child    Parkinson's disease Neg Hx    Allergies  Allergen Reactions   Amoxicillin Swelling and Other (See Comments)    REACTION: Head felt over-heated  Other reaction(s): Other (See Comments)  REACTION: Head felt over-heated    REACTION: Head felt over-heated Other reaction(s): Other (See Comments) REACTION: Head felt over-heated   Gluten Meal Other (See Comments)   Latex Other (See Comments)    Skin irritation   Serotonin Other (See Comments)    Other reaction(s): hyponatremia SSRI's are contraindicated, causes hyponatremia    SSRI's are contraindicated, causes hyponatremia  Other reaction(s): hyponatremia SSRI's are contraindicated, causes hyponatremia  Other reaction(s): hyponatremia SSRI's are contraindicated, causes hyponatremia  SSRI's are contraindicated, causes hyponatremia  Other reaction(s): hyponatremia SSRI's are contraindicated, causes hyponatremia   Serotonin Reuptake Inhibitors (Ssris) Other (See Comments)    Other reaction(s): hyponatremia SSRI's are contraindicated, causes hyponatremia      Positive ROS: All other systems have been reviewed and were otherwise negative  with the exception of those mentioned in the HPI and as above.  Physical Exam: General: Alert, no acute distress Cardiovascular: No pedal edema Respiratory: No cyanosis, no use of accessory musculature Skin: No lesions in the area of chief complaint Neurologic: Mild tremor at baseline Psychiatric: Patient is competent for consent with normal mood and affect  MUSCULOSKELETAL:  RLE Visual exam limited by long leg splint in place No significant/prominent swelling adjacent to splint proximally or distally Splint clean, dry, and intact Tenderness to palpation over shin and knee of RLE No traumatic wounds, ecchymosis, or rash from area visible ROM/gait deferred due to known fractures Mild decreased sensation of right great toe and smallest toe at baseline Fair perfusion and skin warm and dry of toes Able to wiggle toes appropriately    IMAGING: x-ray imaging demonstrates spiral right tibial shaft fracture seen with minimal to mild displacement.  Also proximal right  fibular shaft fracture appreciated with minimal displacement.  Assessment: Principal Problem:   Right tibial fracture Active Problems:   Parkinson's plus syndrome (HCC)   Spiral tibial shaft fracture, right leg, acute, mildly displaced, closed  Proximal fibular fracture, right leg, acute, minimally displaced, closed   Plan: Discussed case in detail with Dr. Edna.  Patient unfortunately has sustained a spiral tibial shaft fracture, along with a proximal fibular fracture.  At this point surgical intervention is recommended to stabilize and manage the tibial fracture, preferably via open reduction internal fixation.   Fixation of the fibular fracture not recommended at this time, anticipate this to heal well non-operatively.    The risks benefits and alternatives were discussed with the patient including but not limited to the risks of nonoperative treatment, versus surgical intervention including infection, bleeding,  nerve injury, malunion, nonunion, the need for revision surgery, hardware prominence, hardware failure, the need for hardware removal, blood clots, cardiopulmonary complications, morbidity, mortality, among others, and they were willing to proceed.     Plan for surgery hopefully tomorrow with one of the orthopedic trauma specialists Dr. Celena or Dr. [name] pending OR availability.  If limited availability, may consider surgery this weekend with Dr. Celena or Monday with Dr. Edna.  Continue NWB status of right lower extremity at this time.  NPO past midnight tonight.  Hold anticoagulation for now.  Recommend CT evaluation of right tibia and fibula pre-operatively to assess for posterior malleolus involvement.  Further details to follow when available.     Bernarda CHRISTELLA Mclean, PA-C  Contact information:   [phone] 7am-5pm epic message Dr. Edna, or call office for patient follow up: [phone] After hours and holidays please check [email or web address] for group call information for [facility]

## 2023-12-01 NOTE — Plan of Care (Signed)
 Direct admission from [facility] Ortho office. Patient is a 73 year old female with pmh of atypical Parkinson's disease/question of multisystem atrophy, bilateral vocal cord dysfunction s/p tracheostomy, NPH with plans of possible VP shunt in future, and labile blood pressure who sustained a right tibial fracture.  Plan is for possible surgical correction of the fracture.  Accepted to a medical telemetry bed.  Vital signs were reported to be stable at this time.  Will need to obtain CT scan of the right lower extremity for further evaluation of the fracture.

## 2023-12-01 NOTE — H&P (Signed)
 History and Physical    Patient: Morgan Reese FMW:[id number] DOB: 1950-07-05 DOA: 12/01/2023 DOS: the patient was seen and examined on 12/01/2023 PCP: Aisha Harvey, MD  Patient coming from: Direct admission from Morgan Reese orthopedic office   Chief Complaint: Right leg fracture HPI: Morgan Reese is a 73 y.o. female with medical history significant of atypical Parkinson's disease/question of multisystem atrophy, bilateral vocal cord dysfunction s/p tracheostomy, NPH with plans of possible VP shunt in future, and labile blood pressure who sustained a right tibial fracture presents with a right leg fracture following a fall. She is accompanied by her husband and friend.  She sustained a right leg fracture following a fall while getting into a camper. The fall occurred on Tuesday at approximately 3:00 PM. She attempted to step into the camper but was unable to move her leg, resulting in a fall where she hit her head on the door jam twice.  She was taken to [facility],  in [place]. Initially, there was a suspicion of a single fracture, but further evaluation revealed three fractures. Surgery was advised but not pursued due to the distance from home. She was transported back home and is currently using a wheelchair for mobility.  She has a history of atypical Parkinson's disease, referred to as 'Parkinson's Plus', and is experiencing tremors. She is sensitive to carbidopa -levodopa  and is currently taking pramipexole  0.5 mg, two tablets three times a day. She also has a history of vocal cord paralysis, which affects her ability to speak, and is awaiting a procedure to address this issue.  She has a history of hydrocephalus and has previously undergone a ventriculostomy. There is a consideration for a shunt placement in the future.  She is on a gluten-free diet and has been experiencing constipation, which has been managed with stool softeners.   Review of Systems: As mentioned  in the history of present illness. All other systems reviewed and are negative. Past Medical History:  Diagnosis Date   Hypertension    Multiple system atrophy (HCC)    Parkinson's disease (HCC)    Sinus congestion    Thyroid  disease    Past Surgical History:  Procedure Laterality Date   CATARACT EXTRACTION, BILATERAL     NASAL SINUS SURGERY     TUBAL LIGATION     Social History:  reports that she has never smoked. She has never used smokeless tobacco. She reports that she does not drink alcohol and does not use drugs.  Allergies  Allergen Reactions   Amoxicillin Swelling and Other (See Comments)    REACTION: Head felt over-heated  Other reaction(s): Other (See Comments)  REACTION: Head felt over-heated    REACTION: Head felt over-heated Other reaction(s): Other (See Comments) REACTION: Head felt over-heated   Gluten Meal Other (See Comments)   Latex Other (See Comments)    Skin irritation   Serotonin Other (See Comments)    Other reaction(s): hyponatremia SSRI's are contraindicated, causes hyponatremia    SSRI's are contraindicated, causes hyponatremia  Other reaction(s): hyponatremia SSRI's are contraindicated, causes hyponatremia  Other reaction(s): hyponatremia SSRI's are contraindicated, causes hyponatremia  SSRI's are contraindicated, causes hyponatremia  Other reaction(s): hyponatremia SSRI's are contraindicated, causes hyponatremia   Serotonin Reuptake Inhibitors (Ssris) Other (See Comments)    Other reaction(s): hyponatremia SSRI's are contraindicated, causes hyponatremia     Family History  Problem Relation Age of Onset   Tremor Mother    Depression Mother    Cancer Father  Lung Cancer   Tremor Maternal Grandmother    Diabetes Paternal Grandmother    Stroke Paternal Uncle    Healthy Child    Parkinson's disease Neg Hx     Prior to Admission medications   Medication Sig Start Date End Date Taking? Authorizing Provider  Ascorbic Acid (VITAMIN C)  1000 MG tablet Take 1,000 mg by mouth daily.    [provider]  Cholecalciferol (VITAMIN D3 PO) Take by mouth. Alternates 10,000 units with 5,000 units each day.    [provider]  COPPER PO Take 30 mg by mouth 2 (two) times a week.    [provider]  estradiol  (ESTRACE ) 0.1 MG/GM vaginal cream Place 1 Applicatorful vaginally at bedtime.    [provider]  estradiol  (VIVELLE -DOT) 0.0375 MG/24HR Place 1 patch onto the skin 2 (two) times a week. 04/14/21   [provider]  gabapentin  (NEURONTIN ) 300 MG capsule Take 1 capsule (300 mg total) by mouth 3 (three) times daily. 08/02/23   Mozingo, Regina Nattalie, NP  levothyroxine  (SYNTHROID ) 50 MCG tablet Take 50 mcg by mouth daily before breakfast.    [provider]  Magnesium Citrate 200 MG TABS Take 600 mg by mouth daily.    [provider]  melatonin 3 MG TABS tablet Take 6-9 mg by mouth at bedtime.    [provider]  NON FORMULARY Apply 2 % topically daily.    [provider]  Omega-3 Fatty Acids (FISH OIL) 1000 MG CAPS Take 1 capsule by mouth at bedtime.    [provider]  progesterone  (PROMETRIUM ) 200 MG capsule Take 200 mg by mouth daily.    [provider]  pyridOXINE  (B-6) 50 MG tablet Take 50 mg by mouth daily.    [provider]  rasagiline  (AZILECT ) 1 MG TABS tablet Take 1 tablet (1 mg total) by mouth daily. 05/12/22   TatAsberry RAMAN, DO  thiamine 100 MG tablet Take 100 mg by mouth daily.    [provider]  VITAMIN K PO Take by mouth daily. MK7    [provider]  zinc  gluconate 50 MG tablet Take 50 mg by mouth daily.    [provider]    Physical Exam: Vitals:   12/01/23 1621  BP: 130/69  Pulse: 84  Resp: 16  Temp: 98 F (36.7 C)  TempSrc: Oral  SpO2: 99%   Constitutional: Elderly female currently in no acute distress Eyes: PERRL, lids and conjunctivae normal ENMT: Mucous membranes are  moist.  Normal dentition.  Neck: Tracheostomy in place with Passy-Muir valve present. Respiratory: clear to auscultation bilaterally, no wheezing, no crackles.   Cardiovascular: Regular rate and rhythm, no murmurs / rubs / gallops.   Abdomen: no tenderness, no masses palpated. No hepatosplenomegaly. Bowel sounds positive.  Musculoskeletal: no clubbing / cyanosis.  Right leg currently in cast. Skin: no rashes, lesions, ulcers. No induration Neurologic: CN 2-12 grossly intact.  Tremor present.  Patient able to move all extremities. Psychiatric: Normal judgment and insight. Alert and oriented x 3. Normal mood.   Data Reviewed:  Reviewed available records.  Assessment and Plan:  Right leg tibial and proximal fibular fracture secondary to fall Patient suffered a fall fracturing her right tibia while trying to get into their camper while in [place]  2 days ago.  Returned home and was seen at orthopedics office today which direct admission was requested with plans for surgery in a.m. - Admit to a medical telemetry bed - Hip  fracture order set utilized - CBC and BMP - Check CT scan of the right lower extremity - Oxycodone /morphine  IV as needed for moderate to severe pain respectively - Senokot-S twice daily - Appreciate orthopedic consultative services we will follow-up for any further recommendations.  Parkinson's plus syndrome Patient is followed by neurology at [facility].  Reports sensitivity to Sinemet . - Continue pramipexole    Vocal cord dysfunction S/p tracheostomy  Labile blood pressure Patient has labile blood pressures - Avoid any aggressive blood pressure regulation as patient has significant drops.  Hypothyroidism - Continue levothyroxine    DVT prophylaxis: Lovenox  Advance Care Planning:   Code Status: Full Code    Consults: Orthopedics  Family Communication: Husband updated at bedside. Severity of Illness: The appropriate patient status for this patient is  INPATIENT. Inpatient status is judged to be reasonable and necessary in order to provide the required intensity of service to ensure the patient's safety. The patient's presenting symptoms, physical exam findings, and initial radiographic and laboratory data in the context of their chronic comorbidities is felt to place them at high risk for further clinical deterioration. Furthermore, it is not anticipated that the patient will be medically stable for discharge from the hospital within 2 midnights of admission.   * I certify that at the point of admission it is my clinical judgment that the patient will require inpatient hospital care spanning beyond 2 midnights from the point of admission due to high intensity of service, high risk for further deterioration and high frequency of surveillance required.*  Author: Maximino DELENA Sharps, MD 12/01/2023 3:59 PM  For on call review [email or web address].

## 2023-12-02 ENCOUNTER — Inpatient Hospital Stay (HOSPITAL_COMMUNITY): Admitting: Anesthesiology

## 2023-12-02 ENCOUNTER — Encounter (HOSPITAL_COMMUNITY)
Admission: AD | Disposition: A | Discharge status: Skilled Nursing Facility | Payer: Self-pay | Source: Ambulatory Visit | Attending: Family Medicine

## 2023-12-02 ENCOUNTER — Other Ambulatory Visit: Payer: Self-pay

## 2023-12-02 ENCOUNTER — Inpatient Hospital Stay (HOSPITAL_COMMUNITY)

## 2023-12-02 ENCOUNTER — Encounter (HOSPITAL_COMMUNITY): Payer: Self-pay | Admitting: Internal Medicine

## 2023-12-02 DIAGNOSIS — S82241A Displaced spiral fracture of shaft of right tibia, initial encounter for closed fracture: Secondary | ICD-10-CM | POA: Diagnosis not present

## 2023-12-02 DIAGNOSIS — S82291A Other fracture of shaft of right tibia, initial encounter for closed fracture: Secondary | ICD-10-CM | POA: Diagnosis not present

## 2023-12-02 HISTORY — PX: ORIF TIBIA FRACTURE: SHX5416

## 2023-12-02 LAB — ABO/RH: ABO/RH(D): O POS

## 2023-12-02 SURGERY — OPEN REDUCTION INTERNAL FIXATION (ORIF) TIBIA FRACTURE
Anesthesia: General | Laterality: Right

## 2023-12-02 MED ORDER — POLYETHYLENE GLYCOL 3350 17 G PO PACK
17.0000 g | PACK | Freq: Two times a day (BID) | ORAL | Status: DC
  Administered 2023-12-02 – 2023-12-06 (×8): 17 g via ORAL
  Filled 2023-12-02 (×8): qty 1

## 2023-12-02 MED ORDER — ONDANSETRON HCL 4 MG PO TABS: 4.0000 mg | ORAL_TABLET | Freq: Four times a day (QID) | ORAL | Status: DC | PRN

## 2023-12-02 MED ORDER — OXYCODONE HCL 5 MG PO TABS
2.5000 mg | ORAL_TABLET | ORAL | Status: DC | PRN
  Administered 2023-12-02 – 2023-12-03 (×3): 5 mg via ORAL
  Filled 2023-12-02 (×3): qty 1

## 2023-12-02 MED ORDER — ONDANSETRON HCL 4 MG/2ML IJ SOLN
INTRAMUSCULAR | Status: AC
  Filled 2023-12-02: qty 2

## 2023-12-02 MED ORDER — LIDOCAINE 2% (20 MG/ML) 5 ML SYRINGE
INTRAMUSCULAR | Status: AC
  Filled 2023-12-02: qty 5

## 2023-12-02 MED ORDER — ONDANSETRON HCL 4 MG/2ML IJ SOLN: 4.0000 mg | Freq: Four times a day (QID) | INTRAMUSCULAR | Status: DC | PRN

## 2023-12-02 MED ORDER — CHLORHEXIDINE GLUCONATE 0.12 % MT SOLN
OROMUCOSAL | Status: AC
  Administered 2023-12-02: 15 mL via OROMUCOSAL
  Filled 2023-12-02: qty 15

## 2023-12-02 MED ORDER — DEXAMETHASONE SODIUM PHOSPHATE 10 MG/ML IJ SOLN
INTRAMUSCULAR | Status: AC
Start: 2023-12-02 — End: 2023-12-02
  Filled 2023-12-02: qty 1

## 2023-12-02 MED ORDER — OXYCODONE HCL 5 MG/5ML PO SOLN: 5.0000 mg | Freq: Once | ORAL | Status: DC | PRN

## 2023-12-02 MED ORDER — SODIUM CHLORIDE 0.9 % IV SOLN: INTRAVENOUS | Status: AC

## 2023-12-02 MED ORDER — PROPOFOL 10 MG/ML IV BOLUS
INTRAVENOUS | Status: DC | PRN
  Administered 2023-12-02: 120 mg via INTRAVENOUS

## 2023-12-02 MED ORDER — CEFAZOLIN SODIUM-DEXTROSE 2-4 GM/100ML-% IV SOLN
INTRAVENOUS | Status: AC
  Filled 2023-12-02: qty 100

## 2023-12-02 MED ORDER — DEXAMETHASONE SODIUM PHOSPHATE 10 MG/ML IJ SOLN
INTRAMUSCULAR | Status: DC | PRN
  Administered 2023-12-02: 5 mg via INTRAVENOUS

## 2023-12-02 MED ORDER — PROPOFOL 10 MG/ML IV BOLUS
INTRAVENOUS | Status: AC
Start: 2023-12-02 — End: 2023-12-02
  Filled 2023-12-02: qty 20

## 2023-12-02 MED ORDER — POLYVINYL ALCOHOL 1.4 % OP SOLN: 1.0000 [drp] | OPHTHALMIC | Status: DC | PRN

## 2023-12-02 MED ORDER — ACETAMINOPHEN 500 MG PO TABS
1000.0000 mg | ORAL_TABLET | Freq: Three times a day (TID) | ORAL | Status: DC
  Administered 2023-12-02 – 2023-12-09 (×22): 1000 mg via ORAL
  Filled 2023-12-02 (×22): qty 2

## 2023-12-02 MED ORDER — CEFAZOLIN SODIUM-DEXTROSE 2-4 GM/100ML-% IV SOLN
2.0000 g | Freq: Once | INTRAVENOUS | Status: AC
  Administered 2023-12-02: 2 g via INTRAVENOUS

## 2023-12-02 MED ORDER — ORAL CARE MOUTH RINSE: 15.0000 mL | Freq: Once | OROMUCOSAL | Status: AC

## 2023-12-02 MED ORDER — LACTATED RINGERS IV SOLN: INTRAVENOUS | Status: DC

## 2023-12-02 MED ORDER — DROPERIDOL 2.5 MG/ML IJ SOLN: 0.6250 mg | Freq: Once | INTRAMUSCULAR | Status: DC | PRN

## 2023-12-02 MED ORDER — VANCOMYCIN HCL 1000 MG IV SOLR
INTRAVENOUS | Status: DC | PRN
  Administered 2023-12-02: 1000 mg via TOPICAL

## 2023-12-02 MED ORDER — MEPERIDINE HCL 25 MG/ML IJ SOLN: 6.2500 mg | INTRAMUSCULAR | Status: DC | PRN

## 2023-12-02 MED ORDER — PHENYLEPHRINE 80 MCG/ML (10ML) SYRINGE FOR IV PUSH (FOR BLOOD PRESSURE SUPPORT)
PREFILLED_SYRINGE | INTRAVENOUS | Status: AC
  Filled 2023-12-02: qty 10

## 2023-12-02 MED ORDER — EPHEDRINE 5 MG/ML INJ
INTRAVENOUS | Status: AC
Start: 2023-12-02 — End: 2023-12-02
  Filled 2023-12-02: qty 5

## 2023-12-02 MED ORDER — DIPHENHYDRAMINE HCL 12.5 MG/5ML PO ELIX: 12.5000 mg | ORAL_SOLUTION | ORAL | Status: DC | PRN

## 2023-12-02 MED ORDER — SODIUM CHLORIDE 0.9 % IV SOLN
1.0000 g | INTRAVENOUS | Status: DC
  Administered 2023-12-02 – 2023-12-04 (×3): 1 g via INTRAVENOUS
  Filled 2023-12-02 (×3): qty 10

## 2023-12-02 MED ORDER — CEFAZOLIN SODIUM-DEXTROSE 2-4 GM/100ML-% IV SOLN
2.0000 g | Freq: Three times a day (TID) | INTRAVENOUS | Status: AC
  Administered 2023-12-02 – 2023-12-03 (×3): 2 g via INTRAVENOUS
  Filled 2023-12-02 (×3): qty 100

## 2023-12-02 MED ORDER — OXYCODONE HCL 5 MG PO TABS: 2.5000 mg | ORAL_TABLET | ORAL | 0 refills | Status: DC | PRN

## 2023-12-02 MED ORDER — ROCURONIUM BROMIDE 10 MG/ML (PF) SYRINGE
PREFILLED_SYRINGE | INTRAVENOUS | Status: DC | PRN
  Administered 2023-12-02: 40 mg via INTRAVENOUS

## 2023-12-02 MED ORDER — VANCOMYCIN HCL 1000 MG IV SOLR
INTRAVENOUS | Status: AC
  Filled 2023-12-02: qty 20

## 2023-12-02 MED ORDER — METOCLOPRAMIDE HCL 5 MG PO TABS: 5.0000 mg | ORAL_TABLET | Freq: Three times a day (TID) | ORAL | Status: DC | PRN

## 2023-12-02 MED ORDER — FENTANYL CITRATE (PF) 250 MCG/5ML IJ SOLN
INTRAMUSCULAR | Status: AC
  Filled 2023-12-02: qty 5

## 2023-12-02 MED ORDER — METOCLOPRAMIDE HCL 5 MG/ML IJ SOLN: 5.0000 mg | Freq: Three times a day (TID) | INTRAMUSCULAR | Status: DC | PRN

## 2023-12-02 MED ORDER — FENTANYL CITRATE (PF) 250 MCG/5ML IJ SOLN
INTRAMUSCULAR | Status: DC | PRN
  Administered 2023-12-02 (×2): 50 ug via INTRAVENOUS

## 2023-12-02 MED ORDER — 0.9 % SODIUM CHLORIDE (POUR BTL) OPTIME
TOPICAL | Status: DC | PRN
  Administered 2023-12-02: 1000 mL

## 2023-12-02 MED ORDER — MORPHINE SULFATE (PF) 2 MG/ML IV SOLN
0.5000 mg | INTRAVENOUS | Status: DC | PRN
  Administered 2023-12-03: 0.5 mg via INTRAVENOUS
  Filled 2023-12-02: qty 1

## 2023-12-02 MED ORDER — CHLORHEXIDINE GLUCONATE 0.12 % MT SOLN: 15.0000 mL | Freq: Once | OROMUCOSAL | Status: AC

## 2023-12-02 MED ORDER — ACETAMINOPHEN 10 MG/ML IV SOLN: 1000.0000 mg | Freq: Once | INTRAVENOUS | Status: DC | PRN

## 2023-12-02 MED ORDER — LIDOCAINE 2% (20 MG/ML) 5 ML SYRINGE
INTRAMUSCULAR | Status: DC | PRN
  Administered 2023-12-02: 40 mg via INTRAVENOUS

## 2023-12-02 MED ORDER — SUGAMMADEX SODIUM 200 MG/2ML IV SOLN
INTRAVENOUS | Status: DC | PRN
  Administered 2023-12-02: 100 mg via INTRAVENOUS

## 2023-12-02 MED ORDER — HYDROMORPHONE HCL 1 MG/ML IJ SOLN
0.2500 mg | INTRAMUSCULAR | Status: DC | PRN
  Administered 2023-12-02: 0.25 mg via INTRAVENOUS

## 2023-12-02 MED ORDER — ONDANSETRON HCL 4 MG/2ML IJ SOLN
INTRAMUSCULAR | Status: DC | PRN
  Administered 2023-12-02: 4 mg via INTRAVENOUS

## 2023-12-02 MED ORDER — CYCLOBENZAPRINE HCL 10 MG PO TABS
10.0000 mg | ORAL_TABLET | Freq: Three times a day (TID) | ORAL | Status: DC | PRN
  Administered 2023-12-03 (×2): 10 mg via ORAL
  Filled 2023-12-02 (×2): qty 1

## 2023-12-02 MED ORDER — OXYCODONE HCL 5 MG PO TABS: 5.0000 mg | ORAL_TABLET | Freq: Once | ORAL | Status: DC | PRN

## 2023-12-02 MED ORDER — HYDROMORPHONE HCL 1 MG/ML IJ SOLN
INTRAMUSCULAR | Status: AC
  Filled 2023-12-02: qty 1

## 2023-12-02 MED ORDER — EPHEDRINE SULFATE-NACL 50-0.9 MG/10ML-% IV SOSY
PREFILLED_SYRINGE | INTRAVENOUS | Status: DC | PRN
  Administered 2023-12-02 (×3): 5 mg via INTRAVENOUS

## 2023-12-02 MED ORDER — ASPIRIN 325 MG PO TBEC: 325.0000 mg | DELAYED_RELEASE_TABLET | Freq: Every day | ORAL | 0 refills | Status: DC

## 2023-12-02 MED ORDER — ACETAMINOPHEN 325 MG PO TABS
650.0000 mg | ORAL_TABLET | Freq: Four times a day (QID) | ORAL | Status: DC
  Administered 2023-12-02: 650 mg via ORAL
  Filled 2023-12-02: qty 2

## 2023-12-02 MED ORDER — DOCUSATE SODIUM 100 MG PO CAPS
100.0000 mg | ORAL_CAPSULE | Freq: Two times a day (BID) | ORAL | Status: DC
  Administered 2023-12-02 – 2023-12-09 (×14): 100 mg via ORAL
  Filled 2023-12-02 (×15): qty 1

## 2023-12-02 MED ORDER — PHENYLEPHRINE 80 MCG/ML (10ML) SYRINGE FOR IV PUSH (FOR BLOOD PRESSURE SUPPORT)
PREFILLED_SYRINGE | INTRAVENOUS | Status: DC | PRN
  Administered 2023-12-02 (×5): 80 ug via INTRAVENOUS
  Administered 2023-12-02: 160 ug via INTRAVENOUS

## 2023-12-02 MED ORDER — POLYETHYLENE GLYCOL 3350 17 G PO PACK
17.0000 g | PACK | Freq: Every day | ORAL | Status: DC | PRN
  Administered 2023-12-06: 17 g via ORAL

## 2023-12-02 SURGICAL SUPPLY — 54 items
BAG COUNTER SPONGE SURGICOUNT (BAG) IMPLANT
BIT DRILL LCP QC 2X140 (BIT) IMPLANT
BIT DRILL X LONG 2.5 (BIT) IMPLANT
BNDG ELASTIC 4INX 5YD STR LF (GAUZE/BANDAGES/DRESSINGS) IMPLANT
BNDG ELASTIC 6INX 5YD STR LF (GAUZE/BANDAGES/DRESSINGS) IMPLANT
BRUSH SCRUB EZ PLAIN DRY (MISCELLANEOUS) ×2 IMPLANT
CHLORAPREP W/TINT 26 (MISCELLANEOUS) ×1 IMPLANT
COVER SURGICAL LIGHT HANDLE (MISCELLANEOUS) ×1 IMPLANT
CUFF TRNQT CYL 34X4.125X (TOURNIQUET CUFF) ×1 IMPLANT
DRAPE C-ARM 42X72 X-RAY (DRAPES) ×1 IMPLANT
DRAPE C-ARMOR (DRAPES) ×1 IMPLANT
DRAPE SURG ORHT 6 SPLT 77X108 (DRAPES) ×2 IMPLANT
DRAPE U-SHAPE 47X51 STRL (DRAPES) ×1 IMPLANT
DRSG MEPITEL 4X7.2 (GAUZE/BANDAGES/DRESSINGS) IMPLANT
ELECTRODE REM PT RTRN 9FT ADLT (ELECTROSURGICAL) ×1 IMPLANT
GAUZE SPONGE 4X4 12PLY STRL (GAUZE/BANDAGES/DRESSINGS) IMPLANT
GLOVE BIO SURGEON STRL SZ 6.5 (GLOVE) ×3 IMPLANT
GLOVE BIO SURGEON STRL SZ7.5 (GLOVE) ×4 IMPLANT
GLOVE BIOGEL PI IND STRL 6.5 (GLOVE) ×1 IMPLANT
GLOVE BIOGEL PI IND STRL 7.5 (GLOVE) ×1 IMPLANT
GLOVE XGUARD RR 2 7.5 (GLOVE) ×1 IMPLANT
GOWN STRL REUS W/ TWL LRG LVL3 (GOWN DISPOSABLE) ×2 IMPLANT
KIT BASIN OR (CUSTOM PROCEDURE TRAY) ×1 IMPLANT
KIT TURNOVER KIT B (KITS) ×1 IMPLANT
KWIRE 1.6X150 (WIRE) IMPLANT
MANIFOLD NEPTUNE II (INSTRUMENTS) IMPLANT
NS IRRIG 1000ML POUR BTL (IV SOLUTION) ×1 IMPLANT
PACK TOTAL JOINT (CUSTOM PROCEDURE TRAY) ×1 IMPLANT
PAD ARMBOARD POSITIONER FOAM (MISCELLANEOUS) ×2 IMPLANT
PAD CAST 4YDX4 CTTN HI CHSV (CAST SUPPLIES) ×1 IMPLANT
PAD CAST CTTN 4X4 STRL (SOFTGOODS) IMPLANT
PADDING CAST COTTON 6X4 STRL (CAST SUPPLIES) ×1 IMPLANT
PLATE LOCK VA-LCP 2.7X202 10H (Plate) IMPLANT
SCREW CORT ST STARDR 3.5X42 (Screw) IMPLANT
SCREW CORTEX LOW PRO 3.5X24 (Screw) IMPLANT
SCREW CORTEX LOW PRO 3.5X26 (Screw) IMPLANT
SCREW LOCKING VA 2.7X42 (Screw) IMPLANT
SCREW LOCKING VA 2.7X46 (Screw) IMPLANT
SCREW LOCKING VA 2.7X50MM (Screw) IMPLANT
SPLINT PLASTER CAST FAST 5X30 (CAST SUPPLIES) IMPLANT
SPONGE T-LAP 18X18 ~~LOC~~+RFID (SPONGE) IMPLANT
STAPLER SKIN PROX 35W (STAPLE) IMPLANT
SUCTION TUBE FRAZIER 10FR DISP (SUCTIONS) ×1 IMPLANT
SUT ETHILON 3 0 PS 1 (SUTURE) IMPLANT
SUT MNCRL AB 3-0 PS2 18 (SUTURE) ×1 IMPLANT
SUT MNCRL AB 3-0 PS2 27 (SUTURE) IMPLANT
SUT MON AB 2-0 CT1 36 (SUTURE) IMPLANT
SUT VIC AB 0 CT1 27XBRD ANBCTR (SUTURE) ×1 IMPLANT
SUT VIC AB 2-0 CT1 TAPERPNT 27 (SUTURE) ×2 IMPLANT
TOWEL GREEN STERILE (TOWEL DISPOSABLE) ×2 IMPLANT
TOWEL GREEN STERILE FF (TOWEL DISPOSABLE) ×1 IMPLANT
TRAY FOLEY MTR SLVR 16FR STAT (SET/KITS/TRAYS/PACK) IMPLANT
UNDERPAD 30X36 HEAVY ABSORB (UNDERPADS AND DIAPERS) ×1 IMPLANT
WATER STERILE IRR 1000ML POUR (IV SOLUTION) ×2 IMPLANT

## 2023-12-02 NOTE — Progress Notes (Addendum)
 PROGRESS NOTE    Morgan Reese  FMW:[id number] DOB: Nov 20, 1950 DOA: 12/01/2023 PCP: Aisha Harvey, MD  No chief complaint on file.   Brief Narrative:   Morgan Reese is Morgan Reese 73 y.o. female with medical history significant of atypical Parkinson's disease/question of multisystem atrophy, bilateral vocal cord dysfunction s/p tracheostomy, NPH with plans of possible VP shunt in future, and labile blood pressure who sustained Morgan Reese right tibial fracture presents with Morgan Reese right leg fracture following Morgan Reese fall. She is accompanied by her husband and friend.   She sustained Morgan Reese right leg fracture following Morgan Reese fall while getting into Morgan Reese camper. The fall occurred on Tuesday at approximately 3:00 PM. She attempted to step into the camper but was unable to move her leg, resulting in Morgan Reese fall where she hit her head on the door jam twice.  She was taken to [facility],  in [place]. Initially, there was Morgan Reese suspicion of Morgan Reese single fracture, but further evaluation revealed three fractures. Surgery was advised but not pursued due to the distance from home. She was transported back home and is currently using [name] wheelchair for mobility.   She has Morgan Reese history of atypical Parkinson's disease, referred to as 'Parkinson's Plus', and is experiencing tremors. She is sensitive to carbidopa -levodopa  and is currently taking pramipexole  0.5 mg, two tablets three times [name] day. She also has Morgan Reese history of vocal cord paralysis, which affects her ability to speak, and is awaiting Morgan Reese procedure to address this issue.   She has Morgan Reese history of hydrocephalus and has previously undergone Morgan Reese ventriculostomy. There is Morgan Reese consideration for Morgan Reese shunt placement in the future.   She is on Morgan Reese gluten-free diet and has been experiencing constipation, which has been managed with stool softeners.  Assessment & Plan:   Principal Problem:   Fracture of tibial shaft, closed Active Problems:   Closed fibular fracture   Fall   Parkinson's plus syndrome  (HCC)   Vocal cord dysfunction   Status post tracheostomy (HCC)   Labile blood pressure   Hypothyroidism  Right leg tibial and proximal fibular fracture secondary to fall Patient suffered Satsuki Zillmer fall fracturing her right tibia while trying to get into their camper while in [place]  2 days ago.  Returned home and was seen at orthopedics office on the day of admission.   Direct admission was requested with plans for surgery in Morgan Reese.m. - CT with oblique nondisplaced spiral fracture of the distal tibial metadiaphysis, minimal displaced fracture of the proximal fibular metaphysis, punctate lineal cortical fragments and relatively well corticated ossicle adjacent to the tip of the lateral malleolus at the level of the anterolateral joint space may refect sequela of prior ligamentous injury, acute cortical avulsion can't be excluded - now s/p ORIF right distal tibia fracture 7/11 - Oxycodone /morphine  IV as needed for moderate to severe pain respectively - miralax  - Appreciate orthopedic consultative services we will follow-up for any further recommendations.   Parkinson's plus syndrome Patient is followed by neurology at [facility].  Reports sensitivity to Sinemet . - Continue pramipexole    Anisocoria L pupil larger than right, [name] notes that this can be seen in parkinson's.  Looks like this might be the most likely explanation .  She reports head CT at OSH was unremarkable (I don't have this report).  Will look into this Morgan Reese bit more.    Vocal cord dysfunction S/p tracheostomy   Labile blood pressure Patient has labile blood pressures - Avoid any aggressive blood pressure regulation as  patient has significant drops.   Hypothyroidism - Continue levothyroxine   HRT - uses estradiol  patch/vaginal estrogen + progesterone  pill  Noted    DVT prophylaxis: lovenox  Code Status: full Family Communication: friend at bedside Disposition:   Status is: Inpatient Remains inpatient appropriate  because: need for inpatient care   Consultants:  orthopedics  Procedures:  Open reduction internal fixation of right distal tibia fracture   Antimicrobials:  Anti-infectives (From admission, onward)    Start     Dose/Rate Route Frequency Ordered Stop   12/02/23 1800  ceFAZolin  (ANCEF ) IVPB 2g/100 mL premix        2 g 200 mL/hr over 30 Minutes Intravenous Every 8 hours 12/02/23 1146 12/03/23 2159   12/02/23 0943  vancomycin  (VANCOCIN ) powder  Status:  Discontinued          As needed 12/02/23 0943 12/02/23 1030   12/02/23 0845  ceFAZolin  (ANCEF ) IVPB 2g/100 mL premix        2 g 200 mL/hr over 30 Minutes Intravenous  Once 12/02/23 0838 12/02/23 0920   12/02/23 0825  ceFAZolin  (ANCEF ) 2-4 GM/100ML-% IVPB       Note to Pharmacy: Jonda Yancy HERO: cabinet override      12/02/23 0825 12/02/23 0931       Subjective: Doing ok post op   Objective: Vitals:   12/02/23 1034 12/02/23 1045 12/02/23 1100 12/02/23 1115  BP: (!) 119/57 (!) 119/57 116/60 (!) 124/51  Pulse: 84 87 81 83  Resp: 15 19 15 18   Temp: (!) 96.8 F (36 C)  (!) 97.5 F (36.4 C) (!) 95.2 F (35.1 C)  TempSrc:    Rectal  SpO2: 99% 94% 95% 97%  Weight:      Height:        Intake/Output Summary (Last 24 hours) at 12/02/2023 1509 Last data filed at 12/02/2023 1035 Gross per 24 hour  Intake 1000 ml  Output 380 ml  Net 620 ml   Filed Weights   12/01/23 1800 12/02/23 0812  Weight: 57.5 kg 54.4 kg    Examination:  General exam: Appears calm and comfortable  Respiratory system: unlabored Cardiovascular system: RRR Gastrointestinal system: Abdomen is nondistended, soft and nontender. Central nervous system: Alert and oriented. CN 2-12 intact.  L pupil larger than R - sluggish pupils, but reactive with .  Extremities: splint to RLE    Data Reviewed: I have personally reviewed following labs and imaging studies  CBC: Recent Labs  Lab 12/01/23 1727  WBC 4.1  HGB 11.6*  HCT 35.2*  MCV 93.4  PLT 142*     Basic Metabolic Panel: Recent Labs  Lab 12/01/23 1727  NA 136  K 3.8  CL 104  CO2 22  GLUCOSE 82  BUN 18  CREATININE 0.49  CALCIUM 8.8*    GFR: Estimated Creatinine Clearance: 51 mL/min (by C-G formula based on SCr of 0.49 mg/dL).  Liver Function Tests: No results for input(s): AST, ALT, ALKPHOS, BILITOT, PROT, ALBUMIN in the last 168 hours.  CBG: No results for input(s): GLUCAP in the last 168 hours.   Recent Results (from the past 240 hours)  Surgical PCR screen     Status: None   Collection Time: 12/01/23 10:05 PM   Specimen: Nasal Mucosa; Nasal Swab  Result Value Ref Range Status   MRSA, PCR NEGATIVE NEGATIVE Final   Staphylococcus aureus NEGATIVE NEGATIVE Final    Comment: (NOTE) The Xpert SA Assay (FDA approved for NASAL specimens in patients 52 years of age  and older), is one component of [place] comprehensive surveillance program. It is not intended to diagnose infection nor to guide or monitor treatment. Performed at [facility], [address], [place], [place] [address]          Radiology Studies: DG Tibia/Fibula Right Port Result Date: 12/02/2023 CLINICAL DATA:  Fracture, postop. EXAM: PORTABLE RIGHT TIBIA AND FIBULA - 2 VIEW COMPARISON:  Preoperative CT FINDINGS: Medial plate and screw fixation of distal tibial shaft fracture. Fracture is in near anatomic alignment Known proximal fibular fracture is only faintly visualized on the current exam. Splint material is seen distally. There is generalized soft tissue edema. IMPRESSION: ORIF of distal tibial shaft fracture. Known proximal fibular fracture is only faintly visualized on the current exam. Electronically Signed   By: Andrea Gasman M.D.   On: 12/02/2023 11:38   DG Tibia/Fibula Right Result Date: 12/02/2023 CLINICAL DATA:  Elective surgery. EXAM: RIGHT TIBIA AND FIBULA - 2 VIEW COMPARISON:  Preoperative CT FINDINGS: Six fluoroscopic spot views of the tibia and fibula submitted  from the operating room. Medial plate and screw fixation of distal tibial fracture. Fluoroscopy time 1 minutes 7 seconds. Dose 1.57 mGy. IMPRESSION: Intraoperative fluoroscopy during distal tibial fracture fixation. Electronically Signed   By: Andrea Gasman M.D.   On: 12/02/2023 11:36   DG C-Arm 1-60 Min-No Report Result Date: 12/02/2023 Fluoroscopy was utilized by the requesting physician.  No radiographic interpretation.   CT TIBIA FIBULA RIGHT WO CONTRAST Result Date: 12/02/2023 CLINICAL DATA:  Fracture, tib/fib preop planning EXAM: CT OF THE LOWER RIGHT EXTREMITY WITHOUT CONTRAST TECHNIQUE: Multidetector CT imaging of the right lower extremity was performed according to the standard protocol. RADIATION DOSE REDUCTION: This exam was performed according to the departmental dose-optimization program which includes automated exposure control, adjustment of the mA and/or kV according to patient size and/or use of iterative reconstruction technique. COMPARISON:  None Available. FINDINGS: Bones/Joint/Cartilage Oblique nondisplaced spiral fracture of the distal tibial metadiaphysis. Proximal fracture margin at the medial tibial diaphysis and distal fracture margin at the anteromedial tibial metaphysis. Minimally displaced fracture of the proximal fibular metaphysis at the level of the fibular neck (series 3, image 39 and series 7, images 30-32). Punctate linear cortical fragments and relatively well corticated ossicle adjacent to the tip of the lateral malleolus at the level of the anterolateral joint space (series 3, images 200 and 201) may reflect sequela of prior ligamentous injury, however, acute cortical avulsion cannot be excluded. No additional fracture identified. Knee is anatomically aligned with mild osteoarthritis. Ankle mortise is congruent. Ligaments Ligaments are suboptimally evaluated by CT. Muscles and Tendons Mild fatty atrophy of the gastrocnemius and soleus muscles. Visualized tendons are  intact. No intramuscular fluid collection. Soft tissue Subcutaneous edema extending through the distal shin and calf and hindfoot. No loculated fluid collection. IMPRESSION: 1. Oblique nondisplaced spiral fracture of the distal tibial metadiaphysis. 2. Minimally displaced fracture of the proximal fibular metaphysis. 3. Punctate linear cortical fragments and relatively well corticated ossicle adjacent to the tip of the lateral malleolus at the level of the anterolateral joint space may reflect sequela of prior ligamentous injury, however, acute cortical avulsion cannot be excluded. No additional fracture identified. Electronically Signed   By: Harrietta Sherry M.D.   On: 12/02/2023 08:34        Scheduled Meds:  acetaminophen   650 mg Oral Q6H   docusate sodium   100 mg Oral BID   enoxaparin  (LOVENOX ) injection  40 mg Subcutaneous Q24H  gabapentin   300 mg Oral TID   levothyroxine   50 mcg Oral QAC breakfast   mupirocin  ointment  1 Application Nasal BID   pramipexole   1 mg Oral TID   progesterone   200 mg Oral Daily   senna-docusate  1 tablet Oral BID   Continuous Infusions:  sodium chloride       ceFAZolin  (ANCEF ) IV       LOS: 1 day    Time spent: over 30 min    Meliton Monte, MD [facility]   To contact the attending provider between 7A-7P or the covering provider during after hours 7P-7A, please log into the web site [email or web address] and access using universal Veteran password for that web site. If you do not have the password, please call the hospital operator.  12/02/2023, 3:09 PM

## 2023-12-02 NOTE — Anesthesia Procedure Notes (Signed)
 Procedure Name: Intubation Date/Time: 12/02/2023 9:19 AM  Performed by: Atanacio Arland HERO, CRNAPre-anesthesia Checklist: Patient identified, Emergency Drugs available, Suction available and Patient being monitored Patient Re-evaluated:Patient Re-evaluated prior to induction Oxygen  Delivery Method: Circle System Utilized Preoxygenation: Pre-oxygenation with 100% oxygen  Induction Type: IV induction Tube type: Oral Tube size: 5.5 mm Number of attempts: 1 Placement Confirmation: positive ETCO2 and breath sounds checked- equal and bilateral Tube secured with: Tape Dental Injury: Teeth and Oropharynx as per pre-operative assessment  Comments: Trach removed after induction, 5.5 reinforced ETT placed through stoma without difficulty.

## 2023-12-02 NOTE — Op Note (Signed)
 Orthopaedic Surgery Operative Note (CSN: [id number] ) Date of Surgery: 12/02/2023  Admit Date: 12/01/2023   Diagnoses: Pre-Op Diagnoses: Right distal tibia fracture  Post-Op Diagnosis: Same  Procedures: CPT 27827-Open reduction internal fixation of right distal tibia fracture  Surgeons : Primary: [name] Franky SQUIBB, MD  Assistant: Lauraine Moores, PA-C  Location: OR 3   Anesthesia: General   Antibiotics: Ancef  2g preop with 1 gm vancomycin  powder placed topically   Tourniquet time: None    Estimated Blood Loss: 30 mL  Complications:* No complications entered in OR log *   Specimens:* No specimens in log *   Implants: Implant Name Type Inv. Item Serial No. Manufacturer Lot No. LRB No. Used Action  PLATE LOCK VA-LCP 2.7X202 10H - [id number] Plate PLATE LOCK VA-LCP 2.7X202 10H  [employer]  Right 1 Implanted  SCREW LOCKING VA 3.5X26MM - [id number] Screw SCREW LOCKING VA 3.5X26MM  [employer]  Right 1 Implanted  SCREW LOCKING VA 2.7X42 - [id number] Screw SCREW LOCKING VA 2.7X42  [employer]  Right 2 Implanted  SCREW LOCKING VA 2.7X46 - [id number] Screw SCREW LOCKING VA 2.7X46  [employer]  Right 2 Implanted  SCREW LOCKING VA 2.7X42 - [id number] Screw SCREW LOCKING VA 2.7X42  [employer]  Right 2 Implanted  SCREW LOCKING VA 2.7X50MM - [id number] Screw SCREW LOCKING VA 2.7X50MM  [employer]  Right 1 Implanted  SCREW CORTEX LOW PRO 3.5X24 - [id number] Screw SCREW CORTEX LOW PRO 3.5X24  [employer]  Right 3 Implanted  SCREW CORTEX LOW PRO 3.5X26 - [id number] Screw SCREW CORTEX LOW PRO 3.5X26  [employer]TH[employer]  Right 1 Implanted  SCREW CORT ST STARDR 3.5X42 - [id number] Screw SCREW CORT ST STARDR 3.5X42  [employer]  Right 1 Implanted     Indications for Surgery: 73 year old female who sustained a fall in [place]  with a right distal tibia fracture.  Due to the unstable nature of her injury I recommend proceeding with open  reduction internal fixation.  Risks and benefits were discussed with the patient.  Risks include but not limited to bleeding, infection, malunion, nonunion, hardware failure, hardware irritation, nerve and blood vessel injury, DVT, even the possibility of anesthetic complications.  She agreed to proceed with surgery and consent was obtained.  Operative Findings: Open reduction internal fixation of right distal tibia fracture using [employer]ynthe[employer] VA 3.5/2.7 mm medial distal tibial locking plate from the ankle trauma set.  Procedure: The patient was identified in the preoperative holding area. Consent was confirmed with the patient and their family and all questions were answered. The operative extremity was marked after confirmation with the patient. she was then brought back to the operating room by our anesthesia colleagues.  She was placed under general anesthetic and carefully transferred over to radiolucent flattop table.  A bump was placed under her operative hip.  The right lower extremity was then prepped and draped in usual sterile fashion.  Timeout was performed to verify the patient, the procedure, and the extremity.  Preoperative antibiotics were dosed.  Fluoroscopic imaging showed the unstable nature of her injury.  A medial approach to the distal tibia was made and carried down through skin and subcutaneous tissue.  I took care to protect the neurovascular bundle of the saphenous nerve and vessel.  I then exposed the medial side of the distal tibia.  I developed the path between the skin and bone immediately.  I chose a Synthes VA 3.5/2.7 mm distal medial tibial locking plate and slid this subcutaneously along  the medial cortex of the tibia.  I held it provisionally distally with a 1.6 mm K wire.  I then placed a 3.5 millimeter screw to suck the plate down to align it appropriately.  I then percutaneously placed 3.5 millimeter screws into the tibial shaft.  A total of 4 screws were placed in the shaft.   I then returned to the distal segment and proceeded to place 5 locking screws that were 2.7 mm in size.  Excellent fixation was obtained.  Final fluoroscopic imaging was obtained.  The incision was copiously irrigated.  A gram of vancomycin  powder was placed into the incision.  A layered closure of 2-0 Monocryl and 3-0 nylon was used to close the skin.  Sterile dressings were applied.  A well-padded short leg splint was then placed.  The patient was then awoke from anesthesia and taken to the PACU in stable condition.  Post Op Plan/Instructions: The patient will be weightbearing for transfers on the right lower extremity.  She will remain in her splint until about 2 weeks until we allow her to transition to a boot.  Will have her mobilize with physical and Occupational Therapy.  She will receive aspirin  for DVT prophylaxis.  I was present and performed the entire surgery.  Lauraine Moores, PA-C did assist me throughout the case. An assistant was necessary given the difficulty in approach, maintenance of reduction and ability to instrument the fracture.   Franky Light, MD [facility]

## 2023-12-02 NOTE — Evaluation (Signed)
 Occupational Therapy Evaluation Patient Details Name: Morgan Reese MRN: [id number] DOB: 1951/03/16 Today's Date: 12/02/2023   History of Present Illness   73 y.o. female with medical history significant of atypical Parkinson's disease/question of multisystem atrophy, bilateral vocal cord dysfunction s/p tracheostomy, NPH with plans of possible VP shunt in future, and labile blood pressure who sustained a right tibial fracture presents with a right leg fracture following a fall.  S/p ORIF to R lower leg 7/11, WBAT for transfers only.     Clinical Impressions Patient admitted for the diagnosis and procedure above.  PTA she lives at home with her spouse, and remains Mod I with ADL,light iADL and mobility with 4wrw.  Deficits are listed below.  For today's session patient did have some dizziness with positional changes, but ultimately was able to stand bedside with Min A and 2wrw.  Patient very shaky, and requesting to sit down, stated she was a bit scared.  Able to lateral scoot to the [facility] with supervision and OT managing bed pad.  OT will continue efforts in the acute setting to address deficits.  Patient's spouse unable to physically assist her, so Patient will benefit from continued inpatient follow up therapy, <3 hours/day.  Patient should be able to reach Mod I level at wheelchair level.       If plan is discharge home, recommend the following:   Assist for transportation;Assistance with cooking/housework;A lot of help with bathing/dressing/bathroom;A lot of help with walking and/or transfers     Functional Status Assessment   Patient has had a recent decline in their functional status and demonstrates the ability to make significant improvements in function in a reasonable and predictable amount of time.     Equipment Recommendations   None recommended by OT     Recommendations for Other Services         Precautions/Restrictions   Precautions Precautions:  Fall Recall of Precautions/Restrictions: Intact Required Braces or Orthoses: Other Brace Other Brace: L foot drop neoprene brace. Restrictions Weight Bearing Restrictions Per Provider Order: Yes RUE Weight Bearing Per Provider Order: Weight bearing as tolerated Other Position/Activity Restrictions: Transfers only     Mobility Bed Mobility Overal bed mobility: Needs Assistance Bed Mobility: Supine to Sit, Sit to Supine     Supine to sit: Min assist, HOB elevated, Mod assist Sit to supine: Min assist, Mod assist        Transfers Overall transfer level: Needs assistance Equipment used: Rolling walker (2 wheels) Transfers: Sit to/from Stand Sit to Stand: Min assist, From elevated surface                  Balance Overall balance assessment: Needs assistance Sitting-balance support: Feet supported Sitting balance-Leahy Scale: Fair     Standing balance support: Reliant on assistive device for balance Standing balance-Leahy Scale: Poor                             ADL either performed or assessed with clinical judgement   ADL Overall ADL's : Needs assistance/impaired Eating/Feeding: Set up;Bed level   Grooming: Wash/dry hands;Wash/dry face;Sitting;Contact guard assist   Upper Body Bathing: Contact guard assist;Sitting   Lower Body Bathing: Moderate assistance;Bed level   Upper Body Dressing : Contact guard assist;Sitting   Lower Body Dressing: Moderate assistance;Bed level   Toilet Transfer: Moderate assistance;Rolling walker (2 wheels);Stand-pivot;BSC/3in1  Vision Patient Visual Report: No change from baseline       Perception Perception: Not tested       Praxis Praxis: Not tested       Pertinent Vitals/Pain Pain Assessment Pain Assessment: Faces Faces Pain Scale: Hurts little more Pain Location: R leg Pain Descriptors / Indicators: Tender, Sore Pain Intervention(s): Monitored during session      Extremity/Trunk Assessment Upper Extremity Assessment Upper Extremity Assessment: Overall WFL for tasks assessed   Lower Extremity Assessment Lower Extremity Assessment: Defer to PT evaluation   Cervical / Trunk Assessment Cervical / Trunk Assessment: Normal   Communication Communication Communication: Impaired Factors Affecting Communication: Trach/intubated   Cognition Arousal: Alert Behavior During Therapy: WFL for tasks assessed/performed Cognition: No apparent impairments                               Following commands: Intact                          Home Living Family/patient expects to be discharged to:: Private residence Living Arrangements: Spouse/significant other Available Help at Discharge: Family;Available 24 hours/day Type of Home: House Home Access: Stairs to enter [employer] of Steps: 3 Entrance Stairs-Rails: Left Home Layout: Multi-level;Able to live on main level with bedroom/bathroom     Bathroom Shower/Tub: Producer, television/film/video: Standard Bathroom Accessibility: Yes How Accessible: Accessible via walker Home Equipment: Rollator (4 wheels);Shower seat;Hand held shower head   Additional Comments: Has ordered a w/c, chair lift for home use      Prior Functioning/Environment Prior Level of Function : Independent/Modified Independent             Mobility Comments: 4wrw for mobility ADLs Comments: Mod I for ADL and light iADL.    OT Problem List: Decreased range of motion;Decreased strength;Decreased activity tolerance;Impaired balance (sitting and/or standing);Pain   OT Treatment/Interventions: Self-care/ADL training;Therapeutic activities;Balance training;Patient/family education;DME and/or AE instruction      OT Goals(Current goals can be found in the care plan section)   Acute Rehab OT Goals Patient Stated Goal: Return home OT Goal Formulation: With patient Time For Goal  Achievement: 12/16/23 Potential to Achieve Goals: Good ADL Goals Pt Will Perform Grooming: with set-up;sitting Pt Will Perform Lower Body Dressing: with min assist;sitting/lateral leans;sit to/from stand Pt Will Transfer to Toilet: with contact guard assist;stand pivot transfer;bedside commode   OT Frequency:  Min 2X/week    Co-evaluation              AM-PAC OT 6 Clicks Daily Activity     Outcome Measure Help from another person eating meals?: None Help from another person taking care of personal grooming?: A Little Help from another person toileting, which includes using toliet, bedpan, or urinal?: A Lot Help from another person bathing (including washing, rinsing, drying)?: A Lot Help from another person to put on and taking off regular upper body clothing?: A Little Help from another person to put on and taking off regular lower body clothing?: A Lot 6 Click Score: 16   End of Session Equipment Utilized During Treatment: Gait belt;Rolling walker (2 wheels) Nurse Communication: Mobility status  Activity Tolerance: Patient tolerated treatment well Patient left: in bed;with call bell/phone within reach;with family/visitor present  OT Visit Diagnosis: Unsteadiness on feet (R26.81);Muscle weakness (generalized) (M62.81);History of falling (Z91.81);Pain Pain - Right/Left: Right Pain - part of body: Leg  Time: 8581-8555 OT Time Calculation (min): 26 min Charges:  OT General Charges $OT Visit: 1 Visit OT Evaluation $OT Eval Moderate Complexity: 1 Mod OT Treatments $Self Care/Home Management : 8-22 mins  12/02/2023  RP, OTR/L  Acute Rehabilitation Services  Office:  [phone]   Morgan Reese 12/02/2023, 2:55 PM

## 2023-12-02 NOTE — Progress Notes (Signed)
 I saw Morgan Reese this morning doing well.  No issues overnight.  Family is at bedside.  Tentatively scheduled for surgery with Dr. Celena this afternoon pending OR availability.  Currently NPO.  We will update her diet status if unable to get to her surgery today.  Admission labs are reassuring.  Will hold DVT prophylaxis pending possible OR today.

## 2023-12-02 NOTE — H&P (View-Only) (Signed)
 Orthopaedic Trauma Service (OTS) Consult   Patient ID: Morgan Reese MRN: [id number] DOB/AGE: 01-11-1951 73 y.o.  Reason for Consult:Right distal tibia fracture Referring Physician: Dr. Rolan Higashi, MD [facility]  HPI: Morgan Reese is an 73 y.o. female who is being seen in consultation at the request of Dr. Higashi for evaluation of right distal tibia fracture.  Patient had a fall while she was in [place]  sustaining a right distal tibia fracture.  She was subsequently brought back home where she lives here in [place] and then she followed up with the [facility] minor practice yesterday.  She was directed to from the hospital for potential surgical stabilization.  I was asked to manage due to OR availability.  Patient was seen and evaluated in the preop holding area.  Patient currently comfortable.  She uses a walker to ambulate and she does have balance problems stemming from her Parkinson's.  She had stridor for which she underwent a tracheostomy that was done earlier this year in March.  She is otherwise active and ambulatory.  Denies any other issues or injuries.  Past Medical History:  Diagnosis Date   Hypertension    Multiple system atrophy (HCC)    Parkinson's disease (HCC)    Sinus congestion    Thyroid  disease     Past Surgical History:  Procedure Laterality Date   CATARACT EXTRACTION, BILATERAL     NASAL SINUS SURGERY     TUBAL LIGATION      Family History  Problem Relation Age of Onset   Tremor Mother    Depression Mother    Cancer Father        Lung Cancer   Tremor Maternal Grandmother    Diabetes Paternal Grandmother    Stroke Paternal Uncle    Healthy Child    Parkinson's disease Neg Hx     Social History:  reports that she has never smoked. She has never used smokeless tobacco. She reports that she does not drink alcohol  and does not use drugs.  Allergies:  Allergies  Allergen Reactions   Amoxicillin Swelling and Other (See Comments)     REACTION: Head felt over-heated  Other reaction(s): Other (See Comments)  REACTION: Head felt over-heated    REACTION: Head felt over-heated Other reaction(s): Other (See Comments) REACTION: Head felt over-heated   Gluten Meal Other (See Comments)   Latex Other (See Comments)    Skin irritation   Serotonin Other (See Comments)    Other reaction(s): hyponatremia SSRI's are contraindicated, causes hyponatremia    SSRI's are contraindicated, causes hyponatremia  Other reaction(s): hyponatremia SSRI's are contraindicated, causes hyponatremia  Other reaction(s): hyponatremia SSRI's are contraindicated, causes hyponatremia  SSRI's are contraindicated, causes hyponatremia  Other reaction(s): hyponatremia SSRI's are contraindicated, causes hyponatremia   Serotonin Reuptake Inhibitors (Ssris) Other (See Comments)    Other reaction(s): hyponatremia SSRI's are contraindicated, causes hyponatremia     Medications:  No current facility-administered medications on file prior to encounter.   Current Outpatient Medications on File Prior to Encounter  Medication Sig Dispense Refill   Ascorbic Acid (VITAMIN C) 1000 MG tablet Take 1,000 mg by mouth daily.     Cholecalciferol (VITAMIN D3 PO) Take by mouth. Alternates 10,000 units with 5,000 units each day.     COPPER PO Take 30 mg by mouth 2 (two) times a week.     cyclobenzaprine  (FLEXERIL ) 10 MG tablet Take 10 mg by mouth 3 (three) times daily as needed.     estradiol  (ESTRACE )  0.1 MG/GM vaginal cream Place 1 Applicatorful vaginally at bedtime.     estradiol  (VIVELLE -DOT) 0.0375 MG/24HR Place 1 patch onto the skin 2 (two) times a week.     gabapentin  (NEURONTIN ) 300 MG capsule Take 1 capsule (300 mg total) by mouth 3 (three) times daily. 270 capsule 3   levothyroxine  (SYNTHROID ) 50 MCG tablet Take 50 mcg by mouth daily before breakfast.     Magnesium Citrate 200 MG TABS Take 600 mg by mouth daily.     melatonin 3 MG TABS tablet Take 6-9 mg by  mouth at bedtime.     Omega-3 Fatty Acids (FISH OIL) 1000 MG CAPS Take 1 capsule by mouth at bedtime.     oxycodone  (OXY-IR) 5 MG capsule Take 5 mg by mouth every 6 (six) hours as needed.     phenazopyridine (PYRIDIUM) 200 MG tablet Take 200 mg by mouth 3 (three) times daily as needed (burning on urination).     progesterone  (PROMETRIUM ) 100 MG capsule Take 200 mg by mouth at bedtime.     pyridOXINE  (B-6) 50 MG tablet Take 50 mg by mouth daily.     thiamine 100 MG tablet Take 100 mg by mouth daily.     VITAMIN K PO Take by mouth daily. MK7     zinc  gluconate 50 MG tablet Take 50 mg by mouth daily.       ROS: Constitutional: No fever or chills Vision: No changes in vision ENT: No difficulty swallowing CV: No chest pain Pulm: No SOB or wheezing GI: No nausea or vomiting GU: No urgency or inability to hold urine Skin: No poor wound healing Neurologic: No numbness or tingling Psychiatric: No depression or anxiety Heme: No bruising Allergic: No reaction to medications or food   Exam: Blood pressure (!) 142/68, pulse 81, temperature 98.6 F (37 C), temperature source Axillary, resp. rate 20, height 5' 3 (1.6 m), weight 54.4 kg, SpO2 98%. General: No acute distress Orientation: Awake alert and oriented x 3 Mood and Affect: Cooperative and pleasant Gait: Unable to assess due to her fracture Coordination and balance: Within normal limits  Right lower extremity: Splint is in place and clean dry and intact.  Compartments are soft and compressible.  I did not take down her splint to evaluate her soft tissues.  Patient is able to move her toes and has intact dorsiflexion plantarflexion of her great toe.  She has intact sensation she is warm well-perfused foot.  Left lower extremity: Skin without lesions. No tenderness to palpation. Full painless ROM, full strength in each muscle groups without evidence of instability.   Medical Decision Making: Data: Imaging: X-rays and CT scan of the  right tibia are reviewed which shows a distal metaphyseal oblique fracture.  No intra-articular extension.  Labs:  Results for orders placed or performed during the hospital encounter of 12/01/23 (from the past 24 hours)  Type and screen [facility]     Status: None   Collection Time: 12/01/23  5:24 PM  Result Value Ref Range   ABO/RH(D) O POS    Antibody Screen NEG    Sample Expiration      12/04/2023,2359 Performed at [facility], [address] [place], [place] [address]   Basic metabolic panel     Status: Abnormal   Collection Time: 12/01/23  5:27 PM  Result Value Ref Range   Sodium 136 135 - 145 mmol/L   Potassium 3.8 3.5 - 5.1 mmol/L   Chloride  104 98 - 111 mmol/L   CO2 22 22 - 32 mmol/L   Glucose, Bld 82 70 - 99 mg/dL   BUN 18 8 - 23 mg/dL   Creatinine, Ser 9.50 0.44 - 1.00 mg/dL   Calcium 8.8 (L) 8.9 - 10.3 mg/dL   GFR, Estimated >39 >39 mL/min   Anion gap 10 5 - 15  CBC     Status: Abnormal   Collection Time: 12/01/23  5:27 PM  Result Value Ref Range   WBC 4.1 4.0 - 10.5 K/uL   RBC 3.77 (L) 3.87 - 5.11 MIL/uL   Hemoglobin 11.6 (L) 12.0 - 15.0 g/dL   HCT 64.7 (L) 63.9 - 53.9 %   MCV 93.4 80.0 - 100.0 fL   MCH 30.8 26.0 - 34.0 pg   MCHC 33.0 30.0 - 36.0 g/dL   RDW 86.3 88.4 - 84.4 %   Platelets 142 (L) 150 - 400 K/uL   nRBC 0.0 0.0 - 0.2 %  Protime-INR     Status: None   Collection Time: 12/01/23  5:27 PM  Result Value Ref Range   Prothrombin Time 14.9 11.4 - 15.2 seconds   INR 1.1 0.8 - 1.2  Surgical PCR screen     Status: None   Collection Time: 12/01/23 10:05 PM   Specimen: Nasal Mucosa; Nasal Swab  Result Value Ref Range   MRSA, PCR NEGATIVE NEGATIVE   Staphylococcus aureus NEGATIVE NEGATIVE     Imaging or Labs ordered: None  Medical history and chart was reviewed and case discussed with medical provider.  Assessment/Plan: 73 year old female with right distal tibia fracture  Due to the unstable nature of her injury and the  need for quicker mobility I recommend proceeding with open duction internal fixation.  Risks included but not limited to bleeding, infection, malunion, nonunion, hardware failure, hardware irritation, nerve and blood vessel injury, DVT, even the possibility anesthetic complications.  She agreed to proceed with surgery and consent was obtained.  Morgan MYRTIS Light, MD [facility] [phone] (office) [email or web address]

## 2023-12-02 NOTE — TOC Initial Note (Signed)
 Transition of Care [facility]) - Initial/Assessment Note    Patient Details  Name: Morgan Reese MRN: [id number] Date of Birth: 11/20/1950  Transition of Care [facility]) CM/SW Contact:    Rosalva Jon Bloch, RN Phone Number: 12/02/2023, 1:14 PM  Clinical Narrative:                 Presents with R tibia fx from a fall. Hx Parkinson's, vocal chord paralysis with tracheostomy, HTN, and multiple system atrophy. From home with husband. PTA independent with ADL's , no home health serviced. Pt  recently ordered a walker W/C and lift chair for pt.  PT/OT evaluation pending ... Pt agreeable to SNF placement if needed.  TOC team following and will assist with needs .  Expected Discharge Plan: Skilled Nursing Facility Barriers to Discharge: Continued Medical Work up   Patient Goals and CMS Choice            Expected Discharge Plan and Services   Discharge Planning Services: CM Consult   Living arrangements for the past 2 months: Single Family Home                                      Prior Living Arrangements/Services Living arrangements for the past 2 months: Single Family Home Lives with:: Spouse Patient language and need for interpreter reviewed:: Yes Do you feel safe going back to the place where you live?: Yes      Need for Family Participation in Patient Care: Yes (Comment) Care giver support system in place?: Yes (comment)   Criminal Activity/Legal Involvement Pertinent to Current Situation/Hospitalization: No - Comment as needed  Activities of Daily Living   ADL Screening (condition at time of admission) Independently performs ADLs?: Yes (appropriate for developmental age) Is the patient deaf or have difficulty hearing?: No Does the patient have difficulty seeing, even when wearing glasses/contacts?: No Does the patient have difficulty concentrating, remembering, or making decisions?: No  Permission Sought/Granted                  Emotional Assessment        Orientation: : Oriented to Self, Oriented to Place, Oriented to  Time, Oriented to Situation Alcohol  / Substance Use: Not Applicable Psych Involvement: No (comment)  Admission diagnosis:  Tibial fracture [S82.209A] Right tibial fracture [S82.201A] Patient Active Problem List   Diagnosis Date Noted   Closed fibular fracture 12/01/2023   Fracture of tibial shaft, closed 12/01/2023   Fall 12/01/2023   Vocal cord dysfunction 12/01/2023   Status post tracheostomy (HCC) 12/01/2023   Labile blood pressure 12/01/2023   Parkinson's plus syndrome (HCC) 04/12/2022   Leg pain 03/30/2022   Disorder of female genital organs 06/26/2021   Midline cystocele 06/26/2021   Generalized anxiety disorder 06/26/2021   Hyponatremia 03/10/2021   Preventive measure 10/07/2014   Hypothyroidism 10/07/2014   PCP:  Aisha Harvey, MD Pharmacy:   [facility] [address] [place] - [address] [place] [address] Phone: [phone] Fax: [phone]  [facility] [address] [place] [address] Phone: [phone] Fax: [phone]     Social Drivers of Health (SDOH) Social History: SDOH Screenings   Food Insecurity: No Food Insecurity (12/01/2023)  Housing: Low Risk  (12/01/2023)  Transportation Needs: No Transportation Needs (12/01/2023)  Utilities: Not At Risk (12/01/2023)  Financial Resource Strain: Low Risk  (  11/15/2023)   Received from [facility]  Physical Activity: Inactive (11/15/2023)   Received from [facility]  Social Connections: Moderately Isolated (12/01/2023)  Stress: No Stress Concern Present (11/15/2023)   [facility]  Tobacco Use: Low Risk  (12/02/2023)   SDOH Interventions:     Readmission Risk Interventions     No data to display

## 2023-12-02 NOTE — Consult Note (Signed)
 Orthopaedic Trauma Service (OTS) Consult   Patient ID: Morgan Reese MRN: [id number] DOB/AGE: 01-11-1951 73 y.o.  Reason for Consult:Right distal tibia fracture Referring Physician: Dr. Rolan Higashi, MD [facility]  HPI: Morgan Reese is an 73 y.o. female who is being seen in consultation at the request of Dr. Higashi for evaluation of right distal tibia fracture.  Patient had a fall while she was in [place]  sustaining a right distal tibia fracture.  She was subsequently brought back home where she lives here in [place] and then she followed up with the [facility] minor practice yesterday.  She was directed to from the hospital for potential surgical stabilization.  I was asked to manage due to OR availability.  Patient was seen and evaluated in the preop holding area.  Patient currently comfortable.  She uses a walker to ambulate and she does have balance problems stemming from her Parkinson's.  She had stridor for which she underwent a tracheostomy that was done earlier this year in March.  She is otherwise active and ambulatory.  Denies any other issues or injuries.  Past Medical History:  Diagnosis Date   Hypertension    Multiple system atrophy (HCC)    Parkinson's disease (HCC)    Sinus congestion    Thyroid  disease     Past Surgical History:  Procedure Laterality Date   CATARACT EXTRACTION, BILATERAL     NASAL SINUS SURGERY     TUBAL LIGATION      Family History  Problem Relation Age of Onset   Tremor Mother    Depression Mother    Cancer Father        Lung Cancer   Tremor Maternal Grandmother    Diabetes Paternal Grandmother    Stroke Paternal Uncle    Healthy Child    Parkinson's disease Neg Hx     Social History:  reports that she has never smoked. She has never used smokeless tobacco. She reports that she does not drink alcohol  and does not use drugs.  Allergies:  Allergies  Allergen Reactions   Amoxicillin Swelling and Other (See Comments)     REACTION: Head felt over-heated  Other reaction(s): Other (See Comments)  REACTION: Head felt over-heated    REACTION: Head felt over-heated Other reaction(s): Other (See Comments) REACTION: Head felt over-heated   Gluten Meal Other (See Comments)   Latex Other (See Comments)    Skin irritation   Serotonin Other (See Comments)    Other reaction(s): hyponatremia SSRI's are contraindicated, causes hyponatremia    SSRI's are contraindicated, causes hyponatremia  Other reaction(s): hyponatremia SSRI's are contraindicated, causes hyponatremia  Other reaction(s): hyponatremia SSRI's are contraindicated, causes hyponatremia  SSRI's are contraindicated, causes hyponatremia  Other reaction(s): hyponatremia SSRI's are contraindicated, causes hyponatremia   Serotonin Reuptake Inhibitors (Ssris) Other (See Comments)    Other reaction(s): hyponatremia SSRI's are contraindicated, causes hyponatremia     Medications:  No current facility-administered medications on file prior to encounter.   Current Outpatient Medications on File Prior to Encounter  Medication Sig Dispense Refill   Ascorbic Acid (VITAMIN C) 1000 MG tablet Take 1,000 mg by mouth daily.     Cholecalciferol (VITAMIN D3 PO) Take by mouth. Alternates 10,000 units with 5,000 units each day.     COPPER PO Take 30 mg by mouth 2 (two) times a week.     cyclobenzaprine  (FLEXERIL ) 10 MG tablet Take 10 mg by mouth 3 (three) times daily as needed.     estradiol  (ESTRACE )  0.1 MG/GM vaginal cream Place 1 Applicatorful vaginally at bedtime.     estradiol  (VIVELLE -DOT) 0.0375 MG/24HR Place 1 patch onto the skin 2 (two) times a week.     gabapentin  (NEURONTIN ) 300 MG capsule Take 1 capsule (300 mg total) by mouth 3 (three) times daily. 270 capsule 3   levothyroxine  (SYNTHROID ) 50 MCG tablet Take 50 mcg by mouth daily before breakfast.     Magnesium Citrate 200 MG TABS Take 600 mg by mouth daily.     melatonin 3 MG TABS tablet Take 6-9 mg by  mouth at bedtime.     Omega-3 Fatty Acids (FISH OIL) 1000 MG CAPS Take 1 capsule by mouth at bedtime.     oxycodone  (OXY-IR) 5 MG capsule Take 5 mg by mouth every 6 (six) hours as needed.     phenazopyridine (PYRIDIUM) 200 MG tablet Take 200 mg by mouth 3 (three) times daily as needed (burning on urination).     progesterone  (PROMETRIUM ) 100 MG capsule Take 200 mg by mouth at bedtime.     pyridOXINE  (B-6) 50 MG tablet Take 50 mg by mouth daily.     thiamine 100 MG tablet Take 100 mg by mouth daily.     VITAMIN K PO Take by mouth daily. MK7     zinc  gluconate 50 MG tablet Take 50 mg by mouth daily.       ROS: Constitutional: No fever or chills Vision: No changes in vision ENT: No difficulty swallowing CV: No chest pain Pulm: No SOB or wheezing GI: No nausea or vomiting GU: No urgency or inability to hold urine Skin: No poor wound healing Neurologic: No numbness or tingling Psychiatric: No depression or anxiety Heme: No bruising Allergic: No reaction to medications or food   Exam: Blood pressure (!) 142/68, pulse 81, temperature 98.6 F (37 C), temperature source Axillary, resp. rate 20, height 5' 3 (1.6 m), weight 54.4 kg, SpO2 98%. General: No acute distress Orientation: Awake alert and oriented x 3 Mood and Affect: Cooperative and pleasant Gait: Unable to assess due to her fracture Coordination and balance: Within normal limits  Right lower extremity: Splint is in place and clean dry and intact.  Compartments are soft and compressible.  I did not take down her splint to evaluate her soft tissues.  Patient is able to move her toes and has intact dorsiflexion plantarflexion of her great toe.  She has intact sensation she is warm well-perfused foot.  Left lower extremity: Skin without lesions. No tenderness to palpation. Full painless ROM, full strength in each muscle groups without evidence of instability.   Medical Decision Making: Data: Imaging: X-rays and CT scan of the  right tibia are reviewed which shows a distal metaphyseal oblique fracture.  No intra-articular extension.  Labs:  Results for orders placed or performed during the hospital encounter of 12/01/23 (from the past 24 hours)  Type and screen [facility]     Status: None   Collection Time: 12/01/23  5:24 PM  Result Value Ref Range   ABO/RH(D) O POS    Antibody Screen NEG    Sample Expiration      12/04/2023,2359 Performed at [facility], [address] [place], [place] [address]   Basic metabolic panel     Status: Abnormal   Collection Time: 12/01/23  5:27 PM  Result Value Ref Range   Sodium 136 135 - 145 mmol/L   Potassium 3.8 3.5 - 5.1 mmol/L   Chloride  104 98 - 111 mmol/L   CO2 22 22 - 32 mmol/L   Glucose, Bld 82 70 - 99 mg/dL   BUN 18 8 - 23 mg/dL   Creatinine, Ser 9.50 0.44 - 1.00 mg/dL   Calcium 8.8 (L) 8.9 - 10.3 mg/dL   GFR, Estimated >39 >39 mL/min   Anion gap 10 5 - 15  CBC     Status: Abnormal   Collection Time: 12/01/23  5:27 PM  Result Value Ref Range   WBC 4.1 4.0 - 10.5 K/uL   RBC 3.77 (L) 3.87 - 5.11 MIL/uL   Hemoglobin 11.6 (L) 12.0 - 15.0 g/dL   HCT 64.7 (L) 63.9 - 53.9 %   MCV 93.4 80.0 - 100.0 fL   MCH 30.8 26.0 - 34.0 pg   MCHC 33.0 30.0 - 36.0 g/dL   RDW 86.3 88.4 - 84.4 %   Platelets 142 (L) 150 - 400 K/uL   nRBC 0.0 0.0 - 0.2 %  Protime-INR     Status: None   Collection Time: 12/01/23  5:27 PM  Result Value Ref Range   Prothrombin Time 14.9 11.4 - 15.2 seconds   INR 1.1 0.8 - 1.2  Surgical PCR screen     Status: None   Collection Time: 12/01/23 10:05 PM   Specimen: Nasal Mucosa; Nasal Swab  Result Value Ref Range   MRSA, PCR NEGATIVE NEGATIVE   Staphylococcus aureus NEGATIVE NEGATIVE     Imaging or Labs ordered: None  Medical history and chart was reviewed and case discussed with medical provider.  Assessment/Plan: 73 year old female with right distal tibia fracture  Due to the unstable nature of her injury and the  need for quicker mobility I recommend proceeding with open duction internal fixation.  Risks included but not limited to bleeding, infection, malunion, nonunion, hardware failure, hardware irritation, nerve and blood vessel injury, DVT, even the possibility anesthetic complications.  She agreed to proceed with surgery and consent was obtained.  Franky MYRTIS Light, MD [facility] [phone] (office) [email or web address]

## 2023-12-02 NOTE — Interval H&P Note (Signed)
 History and Physical Interval Note:  12/02/2023 8:31 AM  Morgan Reese  has presented today for surgery, with the diagnosis of right spiral tibia fracture.  The various methods of treatment have been discussed with the patient and family. After consideration of risks, benefits and other options for treatment, the patient has consented to  Procedure(s): OPEN REDUCTION INTERNAL FIXATION (ORIF) TIBIA FRACTURE (Right) as a surgical intervention.  The patient's history has been reviewed, patient examined, no change in status, stable for surgery.  I have reviewed the patient's chart and labs.  Questions were answered to the patient's satisfaction.     Zamere Pasternak P Lenka Zhao

## 2023-12-02 NOTE — Discharge Instructions (Addendum)
 Franky Light, MD Lauraine Moores PA-C [facility] [address] [phone] [place])   [phone] (fax)                                  POST-OPERATIVE INSTRUCTIONS     WEIGHT BEARING STATUS: Weightbearing for transfers only right lower extremity. Do not want any walker ambulation on right leg  RANGE OF MOTION/ACTIVITY:  Do not remove splint. Ok for knee motion as tolerated  WOUND CARE Please keep splint clean dry and intact until follow-up. If your splint gets wet for any reason please contact the office immediately.  Do not stick anything down your splint such as pencils, momey, hangers to try and scratch yourself.  If you feel itchy take Benadryl  as prescribed on the bottle for itching You may shower on Post-Op Day #2.  You must keep splint dry during this process and may find that a plastic bag taped around the extremity or alternatively a towel based bath may be a better option.   If you get your splint wet or if it is damaged please contact our clinic.  DVT/PE prophylaxis: Aspirin  325 mg daily x 30 days  DIET: As you were eating previously.  Can use over the counter stool softeners and bowel preparations, such as Miralax , to help with bowel movements.  Narcotics can be constipating.  Be sure to drink plenty of fluids   POST-OP MEDICATIONS- Multimodal approach to pain control  In general your pain will be controlled with a combination of substances.  Prescriptions unless otherwise discussed are electronically sent to your pharmacy.  This is a carefully made plan we use to minimize narcotic use.      - Acetaminophen  - Non-narcotic pain medicine taken on a scheduled basis   - Oxycodone  - This is a strong narcotic, to be used only on an as needed basis for pain.  -  Aspirin  325mg  - This medicine is used to minimize the risk of blood clots after surgery.  FOLLOW-UP If you develop a Fever (>101.5), Redness or Drainage from the surgical incision site,  please call our office to arrange for an evaluation. Please call the office to schedule a follow-up appointment for your incision check if you do not already have one, 7-10 days post-operatively.   VISIT OUR WEBSITE FOR ADDITIONAL INFORMATION: [email or web address]   HELPFUL INFORMATION  You should wean off your narcotic medicines as soon as you are able.  Most patients will be off or using minimal narcotics before their first postop appointment.   We suggest you use the pain medication the first night prior to going to bed, in order to ease any pain when the anesthesia wears off. You should avoid taking pain medications on an empty stomach as it will make you nauseous.  Do not drink alcoholic beverages or take illicit drugs when taking pain medications.  In most states it is against the law to drive while you are in a splint or sling.  And certainly against the law to drive while taking narcotics.   Pain medication may make you constipated.  Below are a few solutions to try in this order: Decrease the amount of pain medication if you aren't having pain. Drink lots of decaffeinated fluids. Drink prune juice and/or each dried prunes  If the first 3 don't work start with additional solutions Take Colace - an over-the-counter stool softener Take Senokot - an over-the-counter  laxative Take Miralax  - a stronger over-the-counter laxative

## 2023-12-02 NOTE — Anesthesia Preprocedure Evaluation (Addendum)
 Anesthesia Evaluation  Patient identified by MRN, date of birth, ID band Patient awake    Reviewed: Allergy & Precautions, NPO status , Patient's Chart, lab work & pertinent test results  Airway Mallampati: Trach       Dental  (+) Teeth Intact   Pulmonary neg pulmonary ROS    + decreased breath sounds      Cardiovascular hypertension,  Rhythm:Regular Rate:Normal     Neuro/Psych  PSYCHIATRIC DISORDERS Anxiety     negative neurological ROS     GI/Hepatic negative GI ROS, Neg liver ROS,,,  Endo/Other  Hypothyroidism    Renal/GU negative Renal ROS     Musculoskeletal negative musculoskeletal ROS (+)    Abdominal   Peds  Hematology negative hematology ROS (+)   Anesthesia Other Findings   Reproductive/Obstetrics                              Anesthesia Physical Anesthesia Plan  ASA: 2  Anesthesia Plan: General   Post-op Pain Management: Tylenol  PO (pre-op)* and Gabapentin  PO (pre-op)*   Induction: Intravenous  PONV Risk Score and Plan: 4 or greater and Ondansetron , Dexamethasone  and Treatment may vary due to age or medical condition  Airway Management Planned: Tracheostomy  Additional Equipment: None  Intra-op Plan:   Post-operative Plan: Extubation in OR  Informed Consent: I have reviewed the patients History and Physical, chart, labs and discussed the procedure including the risks, benefits and alternatives for the proposed anesthesia with the patient or authorized representative who has indicated his/her understanding and acceptance.     Dental advisory given  Plan Discussed with: CRNA  Anesthesia Plan Comments: (Swap trach to reinforced ETT via trach stoma.)         Anesthesia Quick Evaluation

## 2023-12-02 NOTE — Transfer of Care (Signed)
 Immediate Anesthesia Transfer of Care Note  Patient: Morgan Reese  Procedure(s) Performed: OPEN REDUCTION INTERNAL FIXATION (ORIF) TIBIA FRACTURE (Right)  Patient Location: PACU  Anesthesia Type:General  Level of Consciousness: drowsy  Airway & Oxygen  Therapy: Patient Spontanous Breathing and Patient connected to tracheostomy mask oxygen   Post-op Assessment: Report given to RN and Post -op Vital signs reviewed and stable  Post vital signs: Reviewed and stable  Last Vitals:  Vitals Value Taken Time  BP 119/57   Temp 97.8   Pulse 84 12/02/23 10:34  Resp 15 12/02/23 10:34  SpO2 100 % 12/02/23 10:34  Vitals shown include unfiled device data.  Last Pain:  Vitals:   12/02/23 0830  TempSrc:   PainSc: 0-No pain      Patients Stated Pain Goal: 3 (12/01/23 1800)  Complications: No notable events documented.

## 2023-12-03 DIAGNOSIS — S82291A Other fracture of shaft of right tibia, initial encounter for closed fracture: Secondary | ICD-10-CM | POA: Diagnosis not present

## 2023-12-03 LAB — CBC
HCT: 33.5 % — ABNORMAL LOW (ref 36.0–46.0)
Hemoglobin: 11.4 g/dL — ABNORMAL LOW (ref 12.0–15.0)
MCH: 30.9 pg (ref 26.0–34.0)
MCHC: 34 g/dL (ref 30.0–36.0)
MCV: 90.8 fL (ref 80.0–100.0)
Platelets: 178 K/uL (ref 150–400)
RBC: 3.69 MIL/uL — ABNORMAL LOW (ref 3.87–5.11)
RDW: 13.1 % (ref 11.5–15.5)
WBC: 6.7 K/uL (ref 4.0–10.5)
nRBC: 0 % (ref 0.0–0.2)

## 2023-12-03 LAB — BASIC METABOLIC PANEL WITH GFR
Anion gap: 9 (ref 5–15)
BUN: 12 mg/dL (ref 8–23)
CO2: 23 mmol/L (ref 22–32)
Calcium: 8.7 mg/dL — ABNORMAL LOW (ref 8.9–10.3)
Chloride: 107 mmol/L (ref 98–111)
Creatinine, Ser: 0.56 mg/dL (ref 0.44–1.00)
GFR, Estimated: >60 mL/min (ref >60)
Glucose, Bld: 100 mg/dL — ABNORMAL HIGH (ref 70–99)
Potassium: 3.3 mmol/L — ABNORMAL LOW (ref 3.5–5.1)
Sodium: 139 mmol/L (ref 135–145)

## 2023-12-03 MED ORDER — MORPHINE SULFATE (PF) 2 MG/ML IV SOLN: 1.0000 mg | INTRAVENOUS | Status: DC | PRN

## 2023-12-03 MED ORDER — OXYCODONE HCL 5 MG PO TABS: 5.0000 mg | ORAL_TABLET | ORAL | Status: DC | PRN

## 2023-12-03 MED ORDER — OXYCODONE HCL 5 MG PO TABS
10.0000 mg | ORAL_TABLET | ORAL | Status: DC | PRN
  Administered 2023-12-04 – 2023-12-06 (×3): 10 mg via ORAL
  Filled 2023-12-03 (×5): qty 2

## 2023-12-03 MED ORDER — OXYCODONE HCL 5 MG PO TABS
5.0000 mg | ORAL_TABLET | ORAL | Status: DC | PRN
  Administered 2023-12-03 – 2023-12-08 (×8): 5 mg via ORAL
  Filled 2023-12-03 (×7): qty 1

## 2023-12-03 NOTE — Evaluation (Signed)
 Physical Therapy Evaluation Patient Details Name: Morgan Reese MRN: [id number] DOB: [birth year] Today's Date: 12/03/2023  History of Present Illness  Pt is a 73 y.o. female who presented 12/01/23 s/p fall in which she sustained a R tibial fx. S/p ORIF R distal tibial fx 7/11. PMH: atypical Parkinson's disease/question of multisystem atrophy, bilateral vocal cord dysfunction s/p tracheostomy, NPH with plans of possible VP shunt in future, and labile blood pressure   Clinical Impression  Pt presents with condition above and deficits mentioned below, see PT Problem List. PTA, she was mod I utilizing a rollator for functional mobility, living with her significant other in a multi-level house with 3 STE. She is able to stay on the main level of the house. The pt reports peripherally neuropathy bil at baseline and currently displays deficits in generalized strength, balance, and activity tolerance. She is currently requiring min-modA for functional transfers utilizing a RW for support. Patient's spouse unable to physically assist her, therefore the pt could benefit from short-term inpatient rehab, < 3 hours/day, to maximize her independence and safety with functional mobility. Will continue to follow acutely.        If plan is discharge home, recommend the following: A lot of help with walking and/or transfers;A lot of help with bathing/dressing/bathroom;Assistance with cooking/housework;Assist for transportation;Help with stairs or ramp for entrance;Direct supervision/assist for medications management;Direct supervision/assist for financial management   Can travel by private vehicle   Yes    Equipment Recommendations BSC/3in1;Wheelchair (measurements PT);Wheelchair cushion (measurements PT);Rolling walker (2 wheels)  Recommendations for Other Services       Functional Status Assessment Patient has had a recent decline in their functional status and demonstrates the ability to make significant  improvements in function in a reasonable and predictable amount of time.     Precautions / Restrictions Precautions Precautions: Fall Recall of Precautions/Restrictions: Intact Precaution/Restrictions Comments: Trach Required Braces or Orthoses: Other Brace Other Brace: L foot drop neoprene brace. Restrictions Weight Bearing Restrictions Per Provider Order: Yes Other Position/Activity Restrictions: R LE WBAT for transfers only      Mobility  Bed Mobility Overal bed mobility: Needs Assistance Bed Mobility: Supine to Sit     Supine to sit: HOB elevated, Used rails, Supervision     General bed mobility comments: Pt able to transition supine to sit R EOB with HOB elevated and use of rails with supervision for safety    Transfers Overall transfer level: Needs assistance Equipment used: Rolling walker (2 wheels) Transfers: Sit to/from Stand, Bed to chair/wheelchair/BSC Sit to Stand: Min assist   Step pivot transfers: Mod assist       General transfer comment: Pt required cues to scoot to EOB and for hand placement to transfer to stand. MinA to power up to stand and gain balance. ModA then to maintain balance and manage RW while being cued to sequence feet and RW movement to step pivot to R from bed to chair.    Ambulation/Gait Ambulation/Gait assistance: Mod assist Gait Distance (Feet): 1 Feet Assistive device: Rolling walker (2 wheels) Gait Pattern/deviations: Step-to pattern, Decreased step length - right, Decreased step length - left, Decreased stride length, Decreased stance time - right, Antalgic, Trunk flexed Gait velocity: reduced Gait velocity interpretation: <1.31 ft/sec, indicative of household ambulator   General Gait Details: Pt takes slow, small, antalgic steps to R to transfer bed to chair with RW support. ModA needed for balance and RW management along with cues for sequencing moving each foot and RW. Deferred further  gait due to orders for WBAT on R leg for  transfers only.  Stairs            Wheelchair Mobility     Tilt Bed    Modified Rankin (Stroke Patients Only)       Balance Overall balance assessment: Needs assistance Sitting-balance support: No upper extremity supported, Feet supported Sitting balance-Leahy Scale: Fair     Standing balance support: Bilateral upper extremity supported, During functional activity, Reliant on assistive device for balance Standing balance-Leahy Scale: Poor Standing balance comment: reliant on RW and external physical assistance                             Pertinent Vitals/Pain Pain Assessment Pain Assessment: Faces Faces Pain Scale: Hurts little more Pain Location: R leg and arch of foot (arch of foot chronic pain) Pain Descriptors / Indicators: Discomfort, Grimacing, Operative site guarding Pain Intervention(s): Limited activity within patient's tolerance, Monitored during session, Repositioned, Other (comment) (gentle stretch to R great toe into extension to stretch plantar fascia with pt reporting pain relief at arch of foot)    Home Living Family/patient expects to be discharged to:: Private residence Living Arrangements: Spouse/significant other Available Help at Discharge: Family;Available 24 hours/day Type of Home: House Home Access: Stairs to enter Entrance Stairs-Rails: Left Entrance Stairs-Number of Steps: 3   Home Layout: Multi-level;Able to live on main level with bedroom/bathroom Home Equipment: Rollator (4 wheels);Shower seat;Hand held shower head Additional Comments: Has ordered a w/c, chair lift for home use    Prior Function Prior Level of Function : Independent/Modified Independent             Mobility Comments: 4wrw for mobility ADLs Comments: Mod I for ADL and light iADL.     Extremity/Trunk Assessment   Upper Extremity Assessment Upper Extremity Assessment: Defer to OT evaluation    Lower Extremity Assessment Lower Extremity  Assessment: RLE deficits/detail;LLE deficits/detail;Generalized weakness RLE Deficits / Details: reports neuropathy inferior to knee bil, but still able to detect touch distally; generalized weakness noted; able to wiggle toes; splint and ACE wrap down lower leg and around ankle and foot LLE Deficits / Details: reports neuropathy inferior to knee bil, but still able to detect touch distally; generalized weakness noted    Cervical / Trunk Assessment Cervical / Trunk Assessment: Normal  Communication   Communication Communication: Impaired Factors Affecting Communication: Trach/intubated    Cognition Arousal: Alert Behavior During Therapy: WFL for tasks assessed/performed   PT - Cognitive impairments: Difficult to assess Difficult to assess due to: Tracheostomy                     PT - Cognition Comments: Pt has trach and has very soft voice. Needs redirecting at times and some cues for sequencing mobility. Unsure if baseline or not Following commands: Impaired Following commands impaired: Follows multi-step commands with increased time     Cueing Cueing Techniques: Verbal cues, Tactile cues     General Comments General comments (skin integrity, edema, etc.): Educated pt on stretching her R platar fascia via passive R toe extension    Exercises Other Exercises Other Exercises: stretched her R platar fascia via passive R toe extension while sitting reclined in chair, 3x ~10 sec each   Assessment/Plan    PT Assessment Patient needs continued PT services  PT Problem List Decreased strength;Decreased range of motion;Decreased activity tolerance;Decreased balance;Decreased mobility;Decreased cognition;Impaired sensation;Pain  PT Treatment Interventions DME instruction;Gait training;Stair training;Functional mobility training;Therapeutic exercise;Therapeutic activities;Balance training;Neuromuscular re-education;Patient/family education;Cognitive remediation;Wheelchair  mobility training    PT Goals (Current goals can be found in the Care Plan section)  Acute Rehab PT Goals Patient Stated Goal: to go to rehab PT Goal Formulation: With patient/family Time For Goal Achievement: 12/17/23 Potential to Achieve Goals: Good    Frequency Min 2X/week     Co-evaluation               AM-PAC PT 6 Clicks Mobility  Outcome Measure Help needed turning from your back to your side while in a flat bed without using bedrails?: A Little Help needed moving from lying on your back to sitting on the side of a flat bed without using bedrails?: A Little Help needed moving to and from a bed to a chair (including a wheelchair)?: A Lot Help needed standing up from a chair using your arms (e.g., wheelchair or bedside chair)?: A Little Help needed to walk in hospital room?: Total Help needed climbing 3-5 steps with a railing? : Total 6 Click Score: 13    End of Session Equipment Utilized During Treatment: Gait belt Activity Tolerance: Patient tolerated treatment well Patient left: in chair;with call bell/phone within reach;with chair alarm set;with family/visitor present Nurse Communication: Mobility status PT Visit Diagnosis: Unsteadiness on feet (R26.81);Other abnormalities of gait and mobility (R26.89);Muscle weakness (generalized) (M62.81);Difficulty in walking, not elsewhere classified (R26.2);Pain Pain - Right/Left: Right Pain - part of body: Leg    Time: 1210-1238 PT Time Calculation (min) (ACUTE ONLY): 28 min   Charges:   PT Evaluation $PT Eval Moderate Complexity: 1 Mod PT Treatments $Therapeutic Activity: 8-22 mins PT General Charges $$ ACUTE PT VISIT: 1 Visit         Morgan Reese, PT, DPT Acute Rehabilitation Services  Office: [phone]   Morgan Reese 12/03/2023, 1:50 PM

## 2023-12-03 NOTE — Plan of Care (Signed)
  Problem: Nutrition: Goal: Adequate nutrition will be maintained Outcome: Progressing   Problem: Coping: Goal: Level of anxiety will decrease Outcome: Progressing   Problem: Pain Managment: Goal: General experience of comfort will improve and/or be controlled Outcome: Progressing   Problem: Safety: Goal: Ability to remain free from injury will improve Outcome: Progressing

## 2023-12-03 NOTE — Progress Notes (Signed)
 PROGRESS NOTE    [name]  FMW:[id number] DOB: 1951/04/05 DOA: 12/01/2023 PCP: Aisha Harvey, MD  No chief complaint on file.   Brief Narrative:   Morgan Reese is Morgan Reese 73 y.o. female with medical history significant of atypical Parkinson's disease/question of multisystem atrophy, bilateral vocal cord dysfunction s/p tracheostomy, NPH with plans of possible VP shunt in future, and labile blood pressure who sustained Morgan Reese right tibial fracture presents with Morgan Reese right leg fracture following Kenlei Safi fall. She is accompanied by her husband and friend.   She sustained Morgan Reese right leg fracture following Morgan Reese fall while getting into [employer] camper. The fall occurred on Tuesday at approximately 3:00 PM. She attempted to step into the camper but was unable to move her leg, resulting in Morgan Reese fall where she hit her head on the door jam twice.  She was taken to [facility],  in [place]. Initially, there was Toddy Boyd suspicion of Marwan Lipe single fracture, but further evaluation revealed three fractures. Surgery was advised but not pursued due to the distance from home. She was transported back home and is currently using Issis Lindseth wheelchair for mobility.   She has Keonia Pasko history of atypical Parkinson's disease, referred to as 'Parkinson's Plus', and is experiencing tremors. She is sensitive to carbidopa -levodopa  and is currently taking pramipexole  0.5 mg, two tablets three times Analea Muller day. She also has Barnie Sopko history of vocal cord paralysis, which affects her ability to speak, and is awaiting Elliannah Wayment procedure to address this issue.   She has Aalivia Mcgraw history of hydrocephalus and has previously undergone Hanad Leino ventriculostomy. There is Arieonna Medine consideration for Sitlaly Gudiel shunt placement in the future.   She is on Avyn Aden gluten-free diet and has been experiencing constipation, which has been managed with stool softeners.  Assessment & Plan:   Principal Problem:   Fracture of tibial shaft, closed Active Problems:   Closed fibular fracture   Fall   Parkinson's plus syndrome  (HCC)   Vocal cord dysfunction   Status post tracheostomy (HCC)   Labile blood pressure   Hypothyroidism  Right leg tibial and proximal fibular fracture secondary to fall Patient suffered Morgan Reese fall fracturing her right tibia while trying to get into their camper while in [place]  2 days ago.  Returned home and was seen at orthopedics office on the day of admission.   Direct admission was requested with plans for surgery in Yasmina Reese.m. - CT with oblique nondisplaced spiral fracture of the distal tibial metadiaphysis, minimal displaced fracture of the proximal fibular metaphysis, punctate lineal cortical fragments and relatively well corticated ossicle adjacent to the tip of the lateral malleolus at the level of the anterolateral joint space may refect sequela of prior ligamentous injury, acute cortical avulsion can't be excluded - now s/p ORIF right distal tibia fracture 7/11 - scheduled APAP, oxy prn, morphine  - miralax  - Appreciate orthopedic consultative services we will follow-up for any further recommendations.   Parkinson's plus syndrome Patient is followed by neurology at [facility].  Reports sensitivity to Sinemet . - Continue pramipexole    [name] UTI - sensitives from OSH pending - will call and see if any results  Anisocoria L pupil larger than right, [name] notes that this can be seen in parkinson's.  Looks like this might be the most likely explanation .  She reports head CT at OSH was unremarkable (I don't have this report).  Will look into this [name] bit more.    Vocal cord dysfunction S/p tracheostomy   Labile blood pressure Patient  has labile blood pressures - Avoid any aggressive blood pressure regulation as patient has significant drops.   Hypothyroidism - Continue levothyroxine   HRT - uses estradiol  patch/vaginal estrogen + progesterone  pill  Noted    DVT prophylaxis: lovenox  Code Status: full Family Communication: friend at bedside Disposition:   Status is:  Inpatient Remains inpatient appropriate because: need for inpatient care   Consultants:  orthopedics  Procedures:  Open reduction internal fixation of right distal tibia fracture   Antimicrobials:  Anti-infectives (From admission, onward)    Start     Dose/Rate Route Frequency Ordered Stop   12/02/23 1800  ceFAZolin  (ANCEF ) IVPB 2g/100 mL premix        2 g 200 mL/hr over 30 Minutes Intravenous Every 8 hours 12/02/23 1146 12/03/23 1358   12/02/23 1600  cefTRIAXone  (ROCEPHIN ) 1 g in sodium chloride  0.9 % 100 mL IVPB       Note to Pharmacy: Retime as needed (can retime after ancef  for surgical prophylaxis)   1 g 200 mL/hr over 30 Minutes Intravenous Every 24 hours 12/02/23 1510 12/07/23 1559   12/02/23 0943  vancomycin  (VANCOCIN ) powder  Status:  Discontinued          As needed 12/02/23 0943 12/02/23 1030   12/02/23 0845  ceFAZolin  (ANCEF ) IVPB 2g/100 mL premix        2 g 200 mL/hr over 30 Minutes Intravenous  Once 12/02/23 0838 12/02/23 0920   12/02/23 0825  ceFAZolin  (ANCEF ) 2-4 GM/100ML-% IVPB       Note to Pharmacy: [name] M: cabinet override      12/02/23 0825 12/02/23 0931       Subjective: C/o pain   Objective: Vitals:   12/03/23 0500 12/03/23 0729 12/03/23 0816 12/03/23 1415  BP: 135/69 (!) 150/82  101/61  Pulse: 84 93 89 88  Resp: 18 16 17 16   Temp: 98.3 F (36.8 C) 98.8 F (37.1 C)  97.8 F (36.6 C)  TempSrc: Oral     SpO2: 99% 97% 98% 98%  Weight:      Height:        Intake/Output Summary (Last 24 hours) at 12/03/2023 1443 Last data filed at 12/03/2023 1426 Gross per 24 hour  Intake 480 ml  Output 1 ml  Net 479 ml   Filed Weights   12/01/23 1800 12/02/23 0812  Weight: 57.5 kg 54.4 kg    Examination:  General: No acute distress. Cardiovascular: RRR Lungs: unlabored Neurological: Alert and oriented 3. Moves all extremities 4 with equal strength. Cranial nerves II through XII grossly intact. Extremities: RLE splint    Data Reviewed:  I have personally reviewed following labs and imaging studies  CBC: Recent Labs  Lab 12/01/23 1727 12/03/23 0707  WBC 4.1 6.7  HGB 11.6* 11.4*  HCT 35.2* 33.5*  MCV 93.4 90.8  PLT 142* 178    Basic Metabolic Panel: Recent Labs  Lab 12/01/23 1727 12/03/23 0707  NA 136 139  K 3.8 3.3*  CL 104 107  CO2 22 23  GLUCOSE 82 100*  BUN 18 12  CREATININE 0.49 0.56  CALCIUM 8.8* 8.7*    GFR: Estimated Creatinine Clearance: 51 mL/min (by C-G formula based on SCr of 0.56 mg/dL).  Liver Function Tests: No results for input(s): AST, ALT, ALKPHOS, BILITOT, PROT, ALBUMIN in the last 168 hours.  CBG: No results for input(s): GLUCAP in the last 168 hours.   Recent Results (from the past 240 hours)  Surgical PCR screen  Status: None   Collection Time: 12/01/23 10:05 PM   Specimen: Nasal Mucosa; Nasal Swab  Result Value Ref Range Status   MRSA, PCR NEGATIVE NEGATIVE Final   Staphylococcus aureus NEGATIVE NEGATIVE Final    Comment: (NOTE) The Xpert SA Assay (FDA approved for NASAL specimens in patients 42 years of age and older), is one component of Lakeyn Dokken comprehensive surveillance program. It is not intended to diagnose infection nor to guide or monitor treatment. Performed at [facility], [address] [place], [place] [address]          Radiology Studies: DG Tibia/Fibula Right Port Result Date: 12/02/2023 CLINICAL DATA:  Fracture, postop. EXAM: PORTABLE RIGHT TIBIA AND FIBULA - 2 VIEW COMPARISON:  Preoperative CT FINDINGS: Medial plate and screw fixation of distal tibial shaft fracture. Fracture is in near anatomic alignment Known proximal fibular fracture is only faintly visualized on the current exam. Splint material is seen distally. There is generalized soft tissue edema. IMPRESSION: ORIF of distal tibial shaft fracture. Known proximal fibular fracture is only faintly visualized on the current exam. Electronically Signed   By: Andrea Gasman M.D.    On: 12/02/2023 11:38   DG Tibia/Fibula Right Result Date: 12/02/2023 CLINICAL DATA:  Elective surgery. EXAM: RIGHT TIBIA AND FIBULA - 2 VIEW COMPARISON:  Preoperative CT FINDINGS: Six fluoroscopic spot views of the tibia and fibula submitted from the operating room. Medial plate and screw fixation of distal tibial fracture. Fluoroscopy time 1 minutes 7 seconds. Dose 1.57 mGy. IMPRESSION: Intraoperative fluoroscopy during distal tibial fracture fixation. Electronically Signed   By: Andrea Gasman M.D.   On: 12/02/2023 11:36   DG C-Arm 1-60 Min-No Report Result Date: 12/02/2023 Fluoroscopy was utilized by the requesting physician.  No radiographic interpretation.   CT TIBIA FIBULA RIGHT WO CONTRAST Result Date: 12/02/2023 CLINICAL DATA:  Fracture, tib/fib preop planning EXAM: CT OF THE LOWER RIGHT EXTREMITY WITHOUT CONTRAST TECHNIQUE: Multidetector CT imaging of the right lower extremity was performed according to the standard protocol. RADIATION DOSE REDUCTION: This exam was performed according to the departmental dose-optimization program which includes automated exposure control, adjustment of the mA and/or kV according to patient size and/or use of iterative reconstruction technique. COMPARISON:  None Available. FINDINGS: Bones/Joint/Cartilage Oblique nondisplaced spiral fracture of the distal tibial metadiaphysis. Proximal fracture margin at the medial tibial diaphysis and distal fracture margin at the anteromedial tibial metaphysis. Minimally displaced fracture of the proximal fibular metaphysis at the level of the fibular neck (series 3, image 39 and series 7, images 30-32). Punctate linear cortical fragments and relatively well corticated ossicle adjacent to the tip of the lateral malleolus at the level of the anterolateral joint space (series 3, images 200 and 201) may reflect sequela of prior ligamentous injury, however, acute cortical avulsion cannot be excluded. No additional fracture  identified. Knee is anatomically aligned with mild osteoarthritis. Ankle mortise is congruent. Ligaments Ligaments are suboptimally evaluated by CT. Muscles and Tendons Mild fatty atrophy of the gastrocnemius and soleus muscles. Visualized tendons are intact. No intramuscular fluid collection. Soft tissue Subcutaneous edema extending through the distal shin and calf and hindfoot. No loculated fluid collection. IMPRESSION: 1. Oblique nondisplaced spiral fracture of the distal tibial metadiaphysis. 2. Minimally displaced fracture of the proximal fibular metaphysis. 3. Punctate linear cortical fragments and relatively well corticated ossicle adjacent to the tip of the lateral malleolus at the level of the anterolateral joint space may reflect sequela of prior ligamentous injury, however, acute cortical avulsion cannot be  excluded. No additional fracture identified. Electronically Signed   By: Harrietta Sherry M.D.   On: 12/02/2023 08:34        Scheduled Meds:  acetaminophen   1,000 mg Oral Q8H   docusate sodium   100 mg Oral BID   enoxaparin  (LOVENOX ) injection  40 mg Subcutaneous Q24H   gabapentin   300 mg Oral TID   levothyroxine   50 mcg Oral QAC breakfast   mupirocin  ointment  1 Application Nasal BID   polyethylene glycol  17 g Oral BID   pramipexole   1 mg Oral TID   progesterone   200 mg Oral Daily   senna-docusate  1 tablet Oral BID   Continuous Infusions:  cefTRIAXone  (ROCEPHIN )  IV 1 g (12/02/23 1734)     LOS: 2 days    Time spent: over 30 min    Meliton Monte, MD [facility]   To contact the attending provider between 7A-7P or the covering provider during after hours 7P-7A, please log into the web site [email or web address] and access using universal [place] password for that web site. If you do not have the password, please call the hospital operator.  12/03/2023, 2:43 PM

## 2023-12-03 NOTE — Progress Notes (Addendum)
     1 Day Post-Op Procedure(s) (LRB): OPEN REDUCTION INTERNAL FIXATION (ORIF) TIBIA FRACTURE (Right) Subjective:  Patient reports pain as mild.  Slept okay.  To mobilize with PT today.  Denies chest pain, ShOB, N/V.  Denies numbness or tingling.  Somewhat anxious though overall in good spirits today.  FM at bedside.  No other complaints at this time.  Objective:   VITALS:   Vitals:   12/03/23 0237 12/03/23 0500 12/03/23 0729 12/03/23 0816  BP: (!) 160/76 135/69 (!) 150/82   Pulse: 92 84 93 89  Resp:  18 16 17   Temp:  98.3 F (36.8 C) 98.8 F (37.1 C)   TempSrc:  Oral    SpO2: 100% 99% 97% 98%  Weight:      Height:        Laying comfortably in NAD Mild decreased sensation of right foot in great toe area and little toe area at baseline, otherwise lower extremity grossly neurologically intact Decent perfusion distally, CRT < 2-3 seconds, warm and dry Demonstrates adequate dorsiflexion and plantar flexion of right foot, drop foot of left leg at baseline Incision: dressing C/D/I Compartment soft Wiggles toes appropriately   Lab Results  Component Value Date   WBC 6.7 12/03/2023   HGB 11.4 (L) 12/03/2023   HCT 33.5 (L) 12/03/2023   MCV 90.8 12/03/2023   PLT 178 12/03/2023   BMET    Component Value Date/Time   NA 139 12/03/2023 0707   K 3.3 (L) 12/03/2023 0707   CL 107 12/03/2023 0707   CO2 23 12/03/2023 0707   GLUCOSE 100 (H) 12/03/2023 0707   BUN 12 12/03/2023 0707   CREATININE 0.56 12/03/2023 0707   CALCIUM 8.7 (L) 12/03/2023 0707   GFRNONAA >60 12/03/2023 0707     Xray: stable post-operative imaging  Assessment/Plan: 1 Day Post-Op   Principal Problem:   Fracture of tibial shaft, closed Active Problems:   Hypothyroidism   Parkinson's plus syndrome (HCC)   Closed fibular fracture   Fall   Vocal cord dysfunction   Status post tracheostomy (HCC)   Labile blood pressure   Procedure(s) (LRB): OPEN REDUCTION INTERNAL FIXATION (ORIF) TIBIA FRACTURE  (Right)   Narrative: 12/03/23: Patient in good spirits today. Discussed post-op expectations in detail.  Likely SNF upon dispo.   Plan: Up with therapy Incentive Spirometry Elevate and Apply ice  Weightbearing: WB for transfers only ROM/Precautions:  limited ankle due to splint Mobility: Continue with PT/OT Insicional and dressing care: Reinforce dressings PRN, keep splint dry Pain management: multimodal  Antibiotics: Periop abx Diet: Advance as tolerated VTE prophylaxis: Lovenox  in house, Aspirin  325mg  daily x 4wks outpatient Dispo: per medicine, pending PT evaluation for possible SNF Follow - up plan: follow up in office in 1-2 weeks with Dr. [name] Hila CHRISTELLA Mclean 12/03/2023, 8:18 AM   Contact information:   Tzzxijbd 7am-5pm epic message Dr. Edna, or call office for patient follow up: [phone] After hours and holidays please check [email or web address] for group call information for [facility]

## 2023-12-04 DIAGNOSIS — S82201A Unspecified fracture of shaft of right tibia, initial encounter for closed fracture: Secondary | ICD-10-CM

## 2023-12-04 LAB — CBC WITH DIFFERENTIAL/PLATELET
Abs Immature Granulocytes: 0.01 K/uL (ref 0.00–0.07)
Basophils Absolute: 0.1 K/uL (ref 0.0–0.1)
Basophils Relative: 1 %
Eosinophils Absolute: 0.2 K/uL (ref 0.0–0.5)
Eosinophils Relative: 4 %
HCT: 31.1 % — ABNORMAL LOW (ref 36.0–46.0)
Hemoglobin: 10.4 g/dL — ABNORMAL LOW (ref 12.0–15.0)
Immature Granulocytes: 0 %
Lymphocytes Relative: 27 %
Lymphs Abs: 1.2 K/uL (ref 0.7–4.0)
MCH: 31.1 pg (ref 26.0–34.0)
MCHC: 33.4 g/dL (ref 30.0–36.0)
MCV: 93.1 fL (ref 80.0–100.0)
Monocytes Absolute: 0.4 K/uL (ref 0.1–1.0)
Monocytes Relative: 8 %
Neutro Abs: 2.6 K/uL (ref 1.7–7.7)
Neutrophils Relative %: 60 %
Platelets: 171 K/uL (ref 150–400)
RBC: 3.34 MIL/uL — ABNORMAL LOW (ref 3.87–5.11)
RDW: 13.3 % (ref 11.5–15.5)
WBC: 4.4 K/uL (ref 4.0–10.5)
nRBC: 0 % (ref 0.0–0.2)

## 2023-12-04 LAB — COMPREHENSIVE METABOLIC PANEL WITH GFR
ALT: 10 U/L (ref 0–44)
AST: 19 U/L (ref 15–41)
Albumin: 2.8 g/dL — ABNORMAL LOW (ref 3.5–5.0)
Alkaline Phosphatase: 50 U/L (ref 38–126)
Anion gap: 8 (ref 5–15)
BUN: 13 mg/dL (ref 8–23)
CO2: 22 mmol/L (ref 22–32)
Calcium: 8.7 mg/dL — ABNORMAL LOW (ref 8.9–10.3)
Chloride: 105 mmol/L (ref 98–111)
Creatinine, Ser: 0.49 mg/dL (ref 0.44–1.00)
GFR, Estimated: >60 mL/min (ref >60)
Glucose, Bld: 84 mg/dL (ref 70–99)
Potassium: 4 mmol/L (ref 3.5–5.1)
Sodium: 135 mmol/L (ref 135–145)
Total Bilirubin: 0.8 mg/dL (ref 0.0–1.2)
Total Protein: 5.3 g/dL — ABNORMAL LOW (ref 6.5–8.1)

## 2023-12-04 LAB — MAGNESIUM: Magnesium: 1.9 mg/dL (ref 1.7–2.4)

## 2023-12-04 LAB — PHOSPHORUS: Phosphorus: 3.6 mg/dL (ref 2.5–4.6)

## 2023-12-04 LAB — TSH: TSH: 3.147 u[IU]/mL (ref 0.350–4.500)

## 2023-12-04 MED ORDER — SODIUM CHLORIDE 0.9 % IV BOLUS
500.0000 mL | Freq: Once | INTRAVENOUS | Status: AC
  Administered 2023-12-04: 500 mL via INTRAVENOUS

## 2023-12-04 NOTE — Progress Notes (Signed)
     2 Days Post-Op Procedure(s) (LRB): OPEN REDUCTION INTERNAL FIXATION (ORIF) TIBIA FRACTURE (Right) Subjective:  Feeling a little overwhelmed about the long term prognosis but in good spirits. Family and patient agreeable with plan to discharge to SNF. No other complaints at this time.  Objective:   VITALS:   Vitals:   12/03/23 1940 12/03/23 1945 12/04/23 0007 12/04/23 0423  BP:  101/62  135/68  Pulse: 94 91 79 83  Resp: 16 15 16 17   Temp:  (!) 97.2 F (36.2 C)  98.2 F (36.8 C)  TempSrc:  Oral  Oral  SpO2: 96% 99% 93% 97%  Weight:      Height:        Laying comfortably in NAD Splint intact Sensation grossly intact at toes.  Cap refill intact.  Wiggles toes appropriately   Lab Results  Component Value Date   WBC 6.7 12/03/2023   HGB 11.4 (L) 12/03/2023   HCT 33.5 (L) 12/03/2023   MCV 90.8 12/03/2023   PLT 178 12/03/2023   BMET    Component Value Date/Time   NA 139 12/03/2023 0707   K 3.3 (L) 12/03/2023 0707   CL 107 12/03/2023 0707   CO2 23 12/03/2023 0707   GLUCOSE 100 (H) 12/03/2023 0707   BUN 12 12/03/2023 0707   CREATININE 0.56 12/03/2023 0707   CALCIUM 8.7 (L) 12/03/2023 0707   GFRNONAA >60 12/03/2023 0707     Xray: stable post-operative imaging  Assessment/Plan: 2 Days Post-Op   Principal Problem:   Fracture of tibial shaft, closed Active Problems:   Hypothyroidism   Parkinson's plus syndrome (HCC)   Closed fibular fracture   Fall   Vocal cord dysfunction   Status post tracheostomy (HCC)   Labile blood pressure   Procedure(s) (LRB): OPEN REDUCTION INTERNAL FIXATION (ORIF) TIBIA FRACTURE (Right)  Plan: Up with therapy Incentive Spirometry Elevate and Apply ice  Weightbearing: WB for transfers only on RLE Mobility: Continue with PT/OT Insicional and dressing care: Reinforce dressings PRN, keep splint dry Pain management: multimodal  Antibiotics: Periop abx Diet: Advance as tolerated VTE prophylaxis: Lovenox  in house,  Aspirin  325mg  daily x 4wks outpatient Dispo: patient amendable with plan for discharge to SNF, TOC on board Follow - up plan: follow up in office in 1-2 weeks with Dr. [name] Berth Morgan Reese 12/04/2023, 7:11 AM   Contact information:   Tzzxijbd 7am-5pm epic message Dr. Edna, or call office for patient follow up: [phone] After hours and holidays please check [email or web address] for group call information for [facility]

## 2023-12-04 NOTE — Plan of Care (Signed)

## 2023-12-04 NOTE — Plan of Care (Signed)
  Problem: Clinical Measurements: Goal: Respiratory complications will improve Outcome: Progressing   Problem: Activity: Goal: Risk for activity intolerance will decrease Outcome: Progressing   Problem: Nutrition: Goal: Adequate nutrition will be maintained Outcome: Progressing   Problem: Coping: Goal: Level of anxiety will decrease Outcome: Progressing   Problem: Elimination: Goal: Will not experience complications related to urinary retention Outcome: Progressing   Problem: Safety: Goal: Ability to remain free from injury will improve Outcome: Progressing   Problem: Skin Integrity: Goal: Risk for impaired skin integrity will decrease Outcome: Progressing

## 2023-12-04 NOTE — Progress Notes (Addendum)
 PROGRESS NOTE    Morgan Reese  FMW:[id number] DOB: 04-Nov-1950 DOA: 12/01/2023 PCP: Aisha Harvey, MD  No chief complaint on file.   Brief Narrative:   Morgan Reese is Morgan Reese 73 y.o. female with medical history significant of atypical Parkinson's disease/question of multisystem atrophy, bilateral vocal cord dysfunction s/p tracheostomy, NPH with plans of possible VP shunt in future, and labile blood pressure who sustained Morgan Reese right tibial fracture presents with Morgan Reese right leg fracture following [name] fall. She is accompanied by her husband and friend.   She sustained Garvey Westcott right leg fracture following Abdi Husak fall while getting into [employer]ino camper. The fall occurred on Tuesday at approximately 3:00 PM. She attempted to step into the camper but was unable to move her leg, resulting in Henley Blyth fall where she hit her head on the door jam twice.  She was taken to [facility],  in [place]. Initially, there was Morgan Reese suspicion of Morgan Reese single fracture, but further evaluation revealed three fractures. Surgery was advised but not pursued due to the distance from home. She was transported back home and is currently using Netha Dafoe wheelchair for mobility.   She has Theresea Trautmann history of atypical Parkinson's disease, referred to as 'Parkinson's Plus', and is experiencing tremors. She is sensitive to carbidopa -levodopa  and is currently taking pramipexole  0.5 mg, two tablets three times Brendan Gadson day. She also has Dione Mccombie history of vocal cord paralysis, which affects her ability to speak, and is awaiting Randell Teare procedure to address this issue.   She has Jamoni Hewes history of hydrocephalus and has previously undergone Keelyn Fjelstad ventriculostomy. There is Reannon Candella consideration for Anira Senegal shunt placement in the future.   She is on Kloee Ballew gluten-free diet and has been experiencing constipation, which has been managed with stool softeners.  Assessment & Plan:   Principal Problem:   Fracture of tibial shaft, closed Active Problems:   Closed fibular fracture   Fall   Parkinson's plus syndrome  (HCC)   Vocal cord dysfunction   Status post tracheostomy (HCC)   Labile blood pressure   Hypothyroidism  Right leg tibial and proximal fibular fracture secondary to fall Patient suffered Morgan Reese fall fracturing her right tibia while trying to get into their camper while in [place]  2 days ago.  Returned home and was seen at orthopedics office on the day of admission.   Direct admission was requested with plans for surgery in [place].m. - CT with oblique nondisplaced spiral fracture of the distal tibial metadiaphysis, minimal displaced fracture of the proximal fibular metaphysis, punctate lineal cortical fragments and relatively well corticated ossicle adjacent to the tip of the lateral malleolus at the level of the anterolateral joint space may refect sequela of prior ligamentous injury, acute cortical avulsion can't be excluded - now s/p ORIF right distal tibia fracture 7/11 - scheduled APAP, oxy prn, morphine  - miralax  - Appreciate orthopedic consultative services we will follow-up for any further recommendations.   Parkinson's plus syndrome Patient is followed by neurology at [facility].  Reports sensitivity to Sinemet . - Continue pramipexole    [name] UTI - sensitives from OSH pending - sensitive to ceftriaxone , FQ, nitrofurantoin.  Will d/c and complete abx for simple UTI with 3 days ceftriaxone .  - secretary to request records  Anisocoria L pupil larger than right, [name] notes that this can be seen in parkinson's.  Looks like this might be the most likely explanation .  She reports head CT at OSH was unremarkable (I don't have this report).  Will look into this  Jaelie Aguilera bit more.    Vocal cord dysfunction S/p tracheostomy   Labile blood pressure Patient has labile blood pressures - Avoid any aggressive blood pressure regulation as patient has significant drops.   Hypothyroidism - Continue levothyroxine   HRT - uses estradiol  patch/vaginal estrogen + progesterone  pill   Noted    DVT prophylaxis: lovenox  Code Status: full Family Communication: husband at bedside Disposition:   Status is: Inpatient Remains inpatient appropriate because: need for inpatient care   Consultants:  orthopedics  Procedures:  Open reduction internal fixation of right distal tibia fracture   Antimicrobials:  Anti-infectives (From admission, onward)    Start     Dose/Rate Route Frequency Ordered Stop   12/02/23 1800  ceFAZolin  (ANCEF ) IVPB 2g/100 mL premix        2 g 200 mL/hr over 30 Minutes Intravenous Every 8 hours 12/02/23 1146 12/03/23 1358   12/02/23 1600  cefTRIAXone  (ROCEPHIN ) 1 g in sodium chloride  0.9 % 100 mL IVPB       Note to Pharmacy: Retime as needed (can retime after ancef  for surgical prophylaxis)   1 g 200 mL/hr over 30 Minutes Intravenous Every 24 hours 12/02/23 1510 12/07/23 1559   12/02/23 0943  vancomycin  (VANCOCIN ) powder  Status:  Discontinued          As needed 12/02/23 0943 12/02/23 1030   12/02/23 0845  ceFAZolin  (ANCEF ) IVPB 2g/100 mL premix        2 g 200 mL/hr over 30 Minutes Intravenous  Once 12/02/23 0838 12/02/23 0920   12/02/23 0825  ceFAZolin  (ANCEF ) 2-4 GM/100ML-% IVPB       Note to Pharmacy: [name] M: cabinet override      12/02/23 0825 12/02/23 0931       Subjective: Continued pain, but improved Husband at bedside  Objective: Vitals:   12/03/23 1945 12/04/23 0007 12/04/23 0423 12/04/23 0741  BP: 101/62  135/68 139/73  Pulse: 91 79 83 85  Resp: 15 16 17 18   Temp: (!) 97.2 F (36.2 C)  98.2 F (36.8 C) (!) 97.3 F (36.3 C)  TempSrc: Oral  Oral Rectal  SpO2: 99% 93% 97% 97%  Weight:      Height:        Intake/Output Summary (Last 24 hours) at 12/04/2023 1048 Last data filed at 12/04/2023 1029 Gross per 24 hour  Intake 800 ml  Output --  Net 800 ml   Filed Weights   12/01/23 1800 12/02/23 0812  Weight: 57.5 kg 54.4 kg    Examination:  General: No acute distress. Cardiovascular: RRR Lungs:  unlabored Neurological: Alert and oriented 3. Moves all extremities 4 with equal strength. Cranial nerves II through XII grossly intact. Skin: Warm and dry. No rashes or lesions. Extremities: RLE splint  Data Reviewed: I have personally reviewed following labs and imaging studies  CBC: Recent Labs  Lab 12/01/23 1727 12/03/23 0707 12/04/23 0818  WBC 4.1 6.7 4.4  NEUTROABS  --   --  2.6  HGB 11.6* 11.4* 10.4*  HCT 35.2* 33.5* 31.1*  MCV 93.4 90.8 93.1  PLT 142* 178 171    Basic Metabolic Panel: Recent Labs  Lab 12/01/23 1727 12/03/23 0707 12/04/23 0818  NA 136 139 135  K 3.8 3.3* 4.0  CL 104 107 105  CO2 22 23 22   GLUCOSE 82 100* 84  BUN 18 12 13   CREATININE 0.49 0.56 0.49  CALCIUM 8.8* 8.7* 8.7*  MG  --   --  1.9  PHOS  --   --  3.6    GFR: Estimated Creatinine Clearance: 51 mL/min (by C-G formula based on SCr of 0.49 mg/dL).  Liver Function Tests: Recent Labs  Lab 12/04/23 0818  AST 19  ALT 10  ALKPHOS 50  BILITOT 0.8  PROT 5.3*  ALBUMIN 2.8*    CBG: No results for input(s): GLUCAP in the last 168 hours.   Recent Results (from the past 240 hours)  Surgical PCR screen     Status: None   Collection Time: 12/01/23 10:05 PM   Specimen: Nasal Mucosa; Nasal Swab  Result Value Ref Range Status   MRSA, PCR NEGATIVE NEGATIVE Final   Staphylococcus aureus NEGATIVE NEGATIVE Final    Comment: (NOTE) The Xpert SA Assay (FDA approved for NASAL specimens in patients 74 years of age and older), is one component of Johanny Segers comprehensive surveillance program. It is not intended to diagnose infection nor to guide or monitor treatment. Performed at [facility], [address] [place], [place] [address]          Radiology Studies: DG Tibia/Fibula Right Port Result Date: 12/02/2023 CLINICAL DATA:  Fracture, postop. EXAM: PORTABLE RIGHT TIBIA AND FIBULA - 2 VIEW COMPARISON:  Preoperative CT FINDINGS: Medial plate and screw fixation of distal tibial  shaft fracture. Fracture is in near anatomic alignment Known proximal fibular fracture is only faintly visualized on the current exam. Splint material is seen distally. There is generalized soft tissue edema. IMPRESSION: ORIF of distal tibial shaft fracture. Known proximal fibular fracture is only faintly visualized on the current exam. Electronically Signed   By: Andrea Gasman M.D.   On: 12/02/2023 11:38        Scheduled Meds:  acetaminophen   1,000 mg Oral Q8H   docusate sodium   100 mg Oral BID   enoxaparin  (LOVENOX ) injection  40 mg Subcutaneous Q24H   gabapentin   300 mg Oral TID   levothyroxine   50 mcg Oral QAC breakfast   mupirocin  ointment  1 Application Nasal BID   polyethylene glycol  17 g Oral BID   pramipexole   1 mg Oral TID   progesterone   200 mg Oral Daily   senna-docusate  1 tablet Oral BID   Continuous Infusions:  cefTRIAXone  (ROCEPHIN )  IV 1 g (12/03/23 1545)     LOS: 3 days    Time spent: over 30 min    Meliton Monte, MD [facility]   To contact the attending provider between 7A-7P or the covering provider during after hours 7P-7A, please log into the web site [email or web address] and access using universal [place] password for that web site. If you do not have the password, please call the hospital operator.  12/04/2023, 10:48 AM

## 2023-12-05 ENCOUNTER — Encounter (HOSPITAL_COMMUNITY): Payer: Self-pay | Admitting: Student

## 2023-12-05 DIAGNOSIS — S82201A Unspecified fracture of shaft of right tibia, initial encounter for closed fracture: Secondary | ICD-10-CM | POA: Diagnosis not present

## 2023-12-05 MED ORDER — FLUTICASONE PROPIONATE 50 MCG/ACT NA SUSP
1.0000 | Freq: Every day | NASAL | Status: DC
  Administered 2023-12-05 – 2023-12-09 (×5): 1 via NASAL
  Filled 2023-12-05: qty 16

## 2023-12-05 MED ORDER — ESTRADIOL 0.0375 MG/24HR TD PTTW
1.0000 | MEDICATED_PATCH | TRANSDERMAL | Status: DC
  Administered 2023-12-05: 1 via TRANSDERMAL

## 2023-12-05 NOTE — Plan of Care (Signed)

## 2023-12-05 NOTE — TOC Initial Note (Addendum)
 Transition of Care [facility]) - Initial/Assessment Note    Patient Details  Name: Morgan Reese MRN: [id number] Date of Birth: [birth year]  [facility]) CM/SW Contact:    Lendia Dais, LCSW Phone Number: 12/05/2023, 3:37 PM  Clinical Narrative: CSW met with patient and spouse Elsie to discuss PT recs for SNF. Pt from home w/ spouse, no current services. Permission given to speak with spouse Elsie and son Emeline. Pt and husband agreeable to SNF, medicare choice document provided, permission given to send our referral in the hub.   Referral sent out in the hub.  PASSR will require additional information.                  Expected Discharge Plan: Skilled Nursing Facility Barriers to Discharge: Continued Medical Work up, SNF Pending bed offer   Patient Goals and CMS Choice Patient states their goals for this hospitalization and ongoing recovery are:: More independence CMS Medicare.gov Compare Post Acute Care list provided to:: Patient Choice offered to / list presented to : Patient, Spouse      Expected Discharge Plan and Services In-house Referral: Clinical Social Work Discharge Planning Services: CM Consult Post Acute Care Choice: Skilled Nursing Facility Living arrangements for the past 2 months: Single Family Home                                      Prior Living Arrangements/Services Living arrangements for the past 2 months: Single Family Home Lives with:: Spouse Patient language and need for interpreter reviewed:: Yes Do you feel safe going back to the place where you live?: Yes      Need for Family Participation in Patient Care: Yes (Comment) Care giver support system in place?: Yes (comment) Current home services: Other (comment) (None) Criminal Activity/Legal Involvement Pertinent to Current Situation/Hospitalization: No - Comment as needed  Activities of Daily Living   ADL Screening (condition at time of admission) Independently  performs ADLs?: Yes (appropriate for developmental age) Is the patient deaf or have difficulty hearing?: No Does the patient have difficulty seeing, even when wearing glasses/contacts?: No Does the patient have difficulty concentrating, remembering, or making decisions?: No  Permission Sought/Granted Permission sought to share information with : Family Supports Permission granted to share information with : Yes, Verbal Permission Granted  Share Information with NAME: Conklin,William (Spouse)  Jared (son)  Permission granted to share info w AGENCY: SNF        Emotional Assessment Appearance:: Appears stated age Attitude/Demeanor/Rapport: Engaged Affect (typically observed): Appropriate, Pleasant Orientation: : Oriented to Self, Oriented to Place, Oriented to  Time, Oriented to Situation Alcohol  / Substance Use: Not Applicable Psych Involvement: No (comment)  Admission diagnosis:  Tibial fracture [S82.209A] Right tibial fracture [S82.201A] Patient Active Problem List   Diagnosis Date Noted   Closed fibular fracture 12/01/2023   Fracture of tibial shaft, closed 12/01/2023   Fall 12/01/2023   Vocal cord dysfunction 12/01/2023   Status post tracheostomy (HCC) 12/01/2023   Labile blood pressure 12/01/2023   Parkinson's plus syndrome (HCC) 04/12/2022   Leg pain 03/30/2022   Disorder of female genital organs 06/26/2021   Midline cystocele 06/26/2021   Generalized anxiety disorder 06/26/2021   Hyponatremia 03/10/2021   Preventive measure 10/07/2014   Hypothyroidism 10/07/2014   PCP:  Aisha Harvey, MD Pharmacy:   [facility] [address] [place], [place] - [phone] [place] [address] Phone: [phone] Fax: [phone]  [facility] [address] [place] [address] Phone: [phone] Fax: [phone]     Social Drivers of Health (SDOH) Social History: SDOH  Screenings   Food Insecurity: No Food Insecurity (12/01/2023)  Housing: Low Risk  (12/01/2023)  Transportation Needs: No Transportation Needs (12/01/2023)  Utilities: Not At Risk (12/01/2023)  Financial Resource Strain: Low Risk  (11/15/2023)   Received from [facility]  Physical Activity: Inactive (11/15/2023)   [facility] from [facility]  Social Connections: Moderately Isolated (12/01/2023)  Stress: No Stress Concern Present (11/15/2023)   [facility]  Tobacco Use: Low Risk  (12/02/2023)   SDOH Interventions:     Readmission Risk Interventions     No data to display

## 2023-12-05 NOTE — Plan of Care (Signed)

## 2023-12-05 NOTE — Progress Notes (Signed)
 Patient was noted to have a low blood pressure of 74/51 mmHg and reported mild dizziness. All other vital signs were within normal limits. The on-call hospitalist, Lynwood Kipper, NP, was notified. New orders were received, including a 500 mL IV fluid bolus. Yellow MEWS protocol was initiated. Patient's blood pressure is improving with ongoing interventions. The patient will continue to be closely monitored, and treatment will be adjusted as clinically appropriate.     12/04/23 2022  Assess: MEWS Score  Temp 98.4 F (36.9 C)  BP (!) 74/51  MAP (mmHg) (!) 59  Pulse Rate 90  Resp 18  Level of Consciousness Alert  SpO2 93 %  O2 Device Room Air  Assess: MEWS Score  MEWS Temp 0  MEWS Systolic 2  MEWS Pulse 0  MEWS RR 0  MEWS LOC 0  MEWS Score 2  MEWS Score Color Yellow  Assess: if the MEWS score is Yellow or Red  Were vital signs accurate and taken at a resting state? Yes  Does the patient meet 2 or more of the SIRS criteria? No  MEWS guidelines implemented  Yes, yellow  Treat  MEWS Interventions Considered administering scheduled or prn medications/treatments as ordered  Take Vital Signs  Increase Vital Sign Frequency  Yellow: Q2hr x1, continue Q4hrs until patient remains green for 12hrs  Escalate  MEWS: Escalate Yellow: Discuss with charge nurse and consider notifying provider and/or RRT  Notify: Charge Nurse/RN  Name of Charge Nurse/RN Notified Mauma, RN  Provider Notification  Provider Name/Title J. Daniels/NP  Date Provider Notified 12/04/23  Time Provider Notified 2033  Method of Notification Page  Notification Reason Other (Comment) (BP 74/51)  Provider response See new orders  Date of Provider Response 12/04/23  Time of Provider Response 2035  Notify: Rapid Response  Name of Rapid Response RN Notified  (No need, pt is stable and interventions taken)  Assess: SIRS CRITERIA  SIRS Temperature  0  SIRS Respirations  0  SIRS Pulse 0  SIRS WBC 0  SIRS Score Sum  0

## 2023-12-05 NOTE — Progress Notes (Signed)
 Pt responded well to the NS bolus, BP improved to 121/66.

## 2023-12-05 NOTE — Progress Notes (Signed)
 Occupational Therapy Treatment Patient Details Name: Morgan Reese MRN: [id number] DOB: 11-30-1950 Today's Date: 12/05/2023   History of present illness Pt is a 73 y.o. female who presented 12/01/23 s/p fall in which she sustained a R tibial fx. S/p ORIF R distal tibial fx 7/11. PMH: atypical Parkinson's disease/question of multisystem atrophy, bilateral vocal cord dysfunction s/p tracheostomy, NPH with plans of possible VP shunt in future, and labile blood pressure   OT comments  Pt making good progress toward OT goals. Session focused on LB dressing techniques and various BSC transfer options. Pt continues to require Mod A for transfers using RW, Min A when using Stedy. Pt requires overall Mod A for LB ADLs during session. Pt does endorse a fear of falling- provided gait belt to improve pt comfort with staff assist during transfers. Continue to recommend postacute rehab stay.       If plan is discharge home, recommend the following:  Assist for transportation;Assistance with cooking/housework;A lot of help with bathing/dressing/bathroom;A lot of help with walking and/or transfers   Equipment Recommendations  None recommended by OT    Recommendations for Other Services      Precautions / Restrictions Precautions Precautions: Fall Recall of Precautions/Restrictions: Intact Precaution/Restrictions Comments: Trach Required Braces or Orthoses: Other Brace Other Brace: L foot drop neoprene brace. Restrictions Weight Bearing Restrictions Per Provider Order: Yes RUE Weight Bearing Per Provider Order: Weight bearing as tolerated Other Position/Activity Restrictions: R LE WBAT for transfers only       Mobility Bed Mobility Overal bed mobility: Needs Assistance Bed Mobility: Supine to Sit     Supine to sit: HOB elevated, Used rails, Supervision          Transfers Overall transfer level: Needs assistance Equipment used: Rolling walker (2 wheels), Ambulation equipment  used Transfers: Bed to chair/wheelchair/BSC, Sit to/from Stand Sit to Stand: Min assist, Mod assist     Step pivot transfers: Mod assist     General transfer comment: Mod A to stand from bedside with RW, Min A to step to [facility] with RW. Educated on [place] d/t pt shakiness and nervousness with transfers. Pt able to stand in [place] with Min A and transfer to recliner with this device. RN and NT present     Balance Overall balance assessment: Needs assistance Sitting-balance support: No upper extremity supported, Feet supported Sitting balance-Leahy Scale: Fair     Standing balance support: Bilateral upper extremity supported, During functional activity, Reliant on assistive device for balance Standing balance-Leahy Scale: Poor                             ADL either performed or assessed with clinical judgement   ADL Overall ADL's : Needs assistance/impaired                     Lower Body Dressing: Moderate assistance;Sitting/lateral leans;Sit to/from stand Lower Body Dressing Details (indicate cue type and reason): assist to don underwear over R foot (large bandage) and assist to don over waist in standing. Min A to don L tennis shoe and L foot drop brace. Toilet Transfer: Moderate assistance;Stand-pivot;Rolling walker (2 wheels);BSC/3in1 Toilet Transfer Details (indicate cue type and reason): Mod A to stand, Min A for stepping to [facility] with minor assist for balance. pt reporting shakiness           General ADL Comments: Emphasis on different transfer techniques for [facility], LB ADLs.    Extremity/Trunk  Assessment Upper Extremity Assessment Upper Extremity Assessment: Generalized weakness;Right hand dominant   Lower Extremity Assessment Lower Extremity Assessment: Defer to PT evaluation        Vision   Vision Assessment?: No apparent visual deficits   Perception     Praxis     Communication Communication Communication: Impaired Factors Affecting  Communication: Trach/intubated   Cognition Arousal: Alert Behavior During Therapy: WFL for tasks assessed/performed Cognition: No apparent impairments                               Following commands: Impaired Following commands impaired: Follows multi-step commands with increased time      Cueing   Cueing Techniques: Verbal cues, Tactile cues  Exercises      Shoulder Instructions       General Comments VSS on RA    Pertinent Vitals/ Pain       Pain Assessment Pain Assessment: 0-10 Pain Score: 5  Pain Location: R foot Pain Descriptors / Indicators:  (stinging) Pain Intervention(s): Monitored during session, Patient requesting pain meds-RN notified, RN gave pain meds during session  Home Living                                          Prior Functioning/Environment              Frequency  Min 2X/week        Progress Toward Goals  OT Goals(current goals can now be found in the care plan section)  Progress towards OT goals: Progressing toward goals  Acute Rehab OT Goals Patient Stated Goal: not be so shaky. avoid falls OT Goal Formulation: With patient Time For Goal Achievement: 12/16/23 Potential to Achieve Goals: Good ADL Goals Pt Will Perform Grooming: with set-up;sitting Pt Will Perform Lower Body Dressing: with min assist;sitting/lateral leans;sit to/from stand Pt Will Transfer to Toilet: with contact guard assist;stand pivot transfer;bedside commode  Plan      Co-evaluation                 AM-PAC OT 6 Clicks Daily Activity     Outcome Measure   Help from another person eating meals?: None Help from another person taking care of personal grooming?: A Little Help from another person toileting, which includes using toliet, bedpan, or urinal?: A Lot Help from another person bathing (including washing, rinsing, drying)?: A Lot Help from another person to put on and taking off regular upper body clothing?: A  Little Help from another person to put on and taking off regular lower body clothing?: A Lot 6 Click Score: 16    End of Session Equipment Utilized During Treatment: Gait belt;Rolling walker (2 wheels)  OT Visit Diagnosis: Unsteadiness on feet (R26.81);Muscle weakness (generalized) (M62.81);History of falling (Z91.81);Pain Pain - Right/Left: Right Pain - part of body: Leg   Activity Tolerance Patient tolerated treatment well   Patient Left in chair;with call bell/phone within reach;with chair alarm set;with nursing/sitter in room   Nurse Communication Mobility status        Time: 9094-9054 OT Time Calculation (min): 40 min  Charges: OT General Charges $OT Visit: 1 Visit OT Treatments $Self Care/Home Management : 8-22 mins $Therapeutic Activity: 8-22 mins  Mliss NOVAK, OTR/L Acute Rehab Services Office: [phone]   Mliss Fish 12/05/2023, 9:52 AM

## 2023-12-05 NOTE — Progress Notes (Signed)
 RE:  Morgan Reese       Date of Birth: 12-05-2050      Date:   12/05/23       To Whom It May Concern:  Please be advised that the above-named patient will require a short-term nursing home stay - anticipated 30 days or less for rehabilitation and strengthening.  The plan is for return home.                 MD signature                Date

## 2023-12-05 NOTE — Progress Notes (Signed)
 PROGRESS NOTE    Morgan Reese  FMW:[id number] DOB: [birth year] DOA: 12/01/2023 PCP: Aisha Harvey, MD  No chief complaint on file.   Brief Narrative:   Morgan Reese is Morgan Reese 73 y.o. female with medical history significant of atypical Parkinson's disease/question of multisystem atrophy, bilateral vocal cord dysfunction s/p tracheostomy, NPH with plans of possible VP shunt in future, and labile blood pressure who sustained Morgan Reese right tibial fracture presents with Morgan Reese right leg fracture following Morgan Reese fall. She is accompanied by her husband and friend.   She sustained Morgan Reese right leg fracture following Morgan Reese fall while getting into Morgan Reese camper. The fall occurred on Tuesday at approximately 3:00 PM. She attempted to step into the camper but was unable to move her leg, resulting in Morgan Reese fall where she hit her head on the door jam twice.  She was taken to [facility],  in [place]. Initially, there was Morgan Reese suspicion of Morgan Reese single fracture, but further evaluation revealed three fractures. Surgery was advised but not pursued due to the distance from home. She was transported back home and is currently using Morgan Reese wheelchair for mobility.   She has Morgan Reese history of atypical Parkinson's disease, referred to as 'Parkinson's Plus', and is experiencing tremors. She is sensitive to carbidopa -levodopa  and is currently taking pramipexole  0.5 mg, two tablets three times Morgan Reese day. She also has Morgan Reese history of vocal cord paralysis, which affects her ability to speak, and is awaiting Morgan Reese procedure to address this issue.   She has Morgan Reese history of hydrocephalus and has previously undergone Morgan Reese ventriculostomy. There is Morgan Reese consideration for Morgan Reese shunt placement in the future.   She is on Morgan Reese gluten-free diet and has been experiencing constipation, which has been managed with stool softeners.  Assessment & Plan:   Principal Problem:   Fracture of tibial shaft, closed Active Problems:   Closed fibular fracture   Fall   Parkinson's plus syndrome  (HCC)   Vocal cord dysfunction   Status post tracheostomy (HCC)   Labile blood pressure   Hypothyroidism  Right leg tibial and proximal fibular fracture secondary to fall Patient suffered Morgan Reese fall fracturing her right tibia while trying to get into their camper while in [place]  2 days ago.  Returned home and was seen at orthopedics office on the day of admission.   Direct admission was requested with plans for surgery in Morgan Reese.m. - CT with oblique nondisplaced spiral fracture of the distal tibial metadiaphysis, minimal displaced fracture of the proximal fibular metaphysis, punctate lineal cortical fragments and relatively well corticated ossicle adjacent to the tip of the lateral malleolus at the level of the anterolateral joint space may refect sequela of prior ligamentous injury, acute cortical avulsion can't be excluded - now s/p ORIF right distal tibia fracture 7/11 - scheduled APAP, oxy prn, morphine  - miralax  - Appreciate orthopedic consultative services we will follow-up for any further recommendations.   Parkinson's plus syndrome Patient is followed by neurology at [facility].  Reports sensitivity to Sinemet . - Continue pramipexole    [name] UTI - sensitives from OSH pending - sensitive to ceftriaxone , FQ, nitrofurantoin.  Will d/c and complete abx for simple UTI with 3 days ceftriaxone .  - secretary to request records  Anisocoria L pupil larger than right, [name] notes that this can be seen in parkinson's.  Looks like this might be the most likely explanation .  She reports head CT at OSH was unremarkable (I don't have this report).  Will look into this  Morgan Reese bit more.    Vocal cord dysfunction S/p tracheostomy   Labile blood pressure Patient has labile blood pressures - Avoid any aggressive blood pressure regulation as patient has significant drops.   Hypothyroidism - Continue levothyroxine   HRT - uses estradiol  patch/vaginal estrogen + progesterone  pill   Noted  Constipation Bowel regimen     DVT prophylaxis: lovenox  Code Status: full Family Communication: husband asleep at bedside, none directly this am Disposition:   Status is: Inpatient Remains inpatient appropriate because: need for inpatient care   Consultants:  orthopedics  Procedures:  Open reduction internal fixation of right distal tibia fracture   Antimicrobials:  Anti-infectives (From admission, onward)    Start     Dose/Rate Route Frequency Ordered Stop   12/02/23 1800  ceFAZolin  (ANCEF ) IVPB 2g/100 mL premix        2 g 200 mL/hr over 30 Minutes Intravenous Every 8 hours 12/02/23 1146 12/03/23 1358   12/02/23 1600  cefTRIAXone  (ROCEPHIN ) 1 g in sodium chloride  0.9 % 100 mL IVPB  Status:  Discontinued       Note to Pharmacy: Retime as needed (can retime after ancef  for surgical prophylaxis)   1 g 200 mL/hr over 30 Minutes Intravenous Every 24 hours 12/02/23 1510 12/04/23 1559   12/02/23 0943  vancomycin  (VANCOCIN ) powder  Status:  Discontinued          As needed 12/02/23 0943 12/02/23 1030   12/02/23 0845  ceFAZolin  (ANCEF ) IVPB 2g/100 mL premix        2 g 200 mL/hr over 30 Minutes Intravenous  Once 12/02/23 0838 12/02/23 0920   12/02/23 0825  ceFAZolin  (ANCEF ) 2-4 GM/100ML-% IVPB       Note to Pharmacy: [name] M: cabinet override      12/02/23 0825 12/02/23 0931       Subjective: No complaints Just woke up  No BM yet  Objective: Vitals:   12/05/23 0110 12/05/23 0524 12/05/23 0711 12/05/23 0819  BP:  129/69 135/81   Pulse: (!) 103 87 83 85  Resp: 20 18  16   Temp:  98.2 F (36.8 C) 97.8 F (36.6 C)   TempSrc:  Oral Oral   SpO2: 93% 95% 98% 95%  Weight:      Height:        Intake/Output Summary (Last 24 hours) at 12/05/2023 0829 Last data filed at 12/05/2023 0600 Gross per 24 hour  Intake 600 ml  Output 850 ml  Net -250 ml   Filed Weights   12/01/23 1800 12/02/23 0812  Weight: 57.5 kg 54.4 kg    Examination:  General: No  acute distress. Cardiovascular: RRR Lungs: unlabored Neurological: Alert and oriented 3. Moves all extremities 4 with equal strength. Cranial nerves II through XII grossly intact. Extremities: RLE splint  Data Reviewed: I have personally reviewed following labs and imaging studies  CBC: Recent Labs  Lab 12/01/23 1727 12/03/23 0707 12/04/23 0818  WBC 4.1 6.7 4.4  NEUTROABS  --   --  2.6  HGB 11.6* 11.4* 10.4*  HCT 35.2* 33.5* 31.1*  MCV 93.4 90.8 93.1  PLT 142* 178 171    Basic Metabolic Panel: Recent Labs  Lab 12/01/23 1727 12/03/23 0707 12/04/23 0818  NA 136 139 135  K 3.8 3.3* 4.0  CL 104 107 105  CO2 22 23 22   GLUCOSE 82 100* 84  BUN 18 12 13   CREATININE 0.49 0.56 0.49  CALCIUM 8.8* 8.7* 8.7*  MG  --   --  1.9  PHOS  --   --  3.6    GFR: Estimated Creatinine Clearance: 51 mL/min (by C-G formula based on SCr of 0.49 mg/dL).  Liver Function Tests: Recent Labs  Lab 12/04/23 0818  AST 19  ALT 10  ALKPHOS 50  BILITOT 0.8  PROT 5.3*  ALBUMIN 2.8*    CBG: No results for input(s): GLUCAP in the last 168 hours.   Recent Results (from the past 240 hours)  Surgical PCR screen     Status: None   Collection Time: 12/01/23 10:05 PM   Specimen: Nasal Mucosa; Nasal Swab  Result Value Ref Range Status   MRSA, PCR NEGATIVE NEGATIVE Final   Staphylococcus aureus NEGATIVE NEGATIVE Final    Comment: (NOTE) The Xpert SA Assay (FDA approved for NASAL specimens in patients 34 years of age and older), is one component of [facility] comprehensive surveillance program. It is not intended to diagnose infection nor to guide or monitor treatment. Performed at [facility], [address], [place], [place] [address]          Radiology Studies: No results found.       Scheduled Meds:  acetaminophen   1,000 mg Oral Q8H   docusate sodium   100 mg Oral BID   enoxaparin  (LOVENOX ) injection  40 mg Subcutaneous Q24H   gabapentin   300 mg Oral TID    levothyroxine   50 mcg Oral QAC breakfast   mupirocin  ointment  1 Application Nasal BID   polyethylene glycol  17 g Oral BID   pramipexole   1 mg Oral TID   progesterone   200 mg Oral Daily   senna-docusate  1 tablet Oral BID   Continuous Infusions:     LOS: 4 days    Time spent: over 30 min    Meliton Monte, MD [facility]   To contact the attending provider between 7A-7P or the covering provider during after hours 7P-7A, please log into the web site [email or web address] and access using universal [place] password for that web site. If you do not have the password, please call the hospital operator.  12/05/2023, 8:29 AM

## 2023-12-05 NOTE — Anesthesia Postprocedure Evaluation (Signed)
 Anesthesia Post Note  Patient: Morgan Reese  Procedure(s) Performed: OPEN REDUCTION INTERNAL FIXATION (ORIF) TIBIA FRACTURE (Right)     Patient location during evaluation: PACU Anesthesia Type: General Level of consciousness: awake and alert Pain management: pain level controlled Vital Signs Assessment: post-procedure vital signs reviewed and stable Respiratory status: spontaneous breathing, nonlabored ventilation, respiratory function stable and patient connected to nasal cannula oxygen  Cardiovascular status: blood pressure returned to baseline and stable Postop Assessment: no apparent nausea or vomiting Anesthetic complications: no   No notable events documented.  Last Vitals:  Vitals:   12/05/23 0711 12/05/23 0819  BP: 135/81   Pulse: 83 85  Resp:  16  Temp: 36.6 C   SpO2: 98% 95%    Last Pain:  Vitals:   12/05/23 0711  TempSrc: Oral  PainSc:                  [name]

## 2023-12-05 NOTE — NC FL2 (Cosign Needed Addendum)
 [place]  MEDICAID [id number] LEVEL OF CARE FORM     IDENTIFICATION  Patient Name: Morgan Reese Birthdate: 04/10/1951 Sex: female Admission Date (Current Location): 12/01/2023  [facility] and [place] Number:  Producer, television/film/video and Address:  [facility] [place], [address], [place], [place] [address]      Provider Number: [id number]  Attending Physician Name and Address:  Perri DELENA Meliton Mickey., *  Relative Name and Phone Number:  Patti Fallow (Spouse)  [phone]    Current Level of Care: Hospital Recommended Level of Care: Skilled Nursing Facility Prior Approval Number:    Date Approved/Denied:   PASRR Number:    Discharge Plan: SNF    Current Diagnoses: Patient Active Problem List   Diagnosis Date Noted   Closed fibular fracture 12/01/2023   Fracture of tibial shaft, closed 12/01/2023   Fall 12/01/2023   Vocal cord dysfunction 12/01/2023   Status post tracheostomy (HCC) 12/01/2023   Labile blood pressure 12/01/2023   Parkinson's plus syndrome (HCC) 04/12/2022   Leg pain 03/30/2022   Disorder of female genital organs 06/26/2021   Midline cystocele 06/26/2021   Generalized anxiety disorder 06/26/2021   Hyponatremia 03/10/2021   Preventive measure 10/07/2014   Hypothyroidism 10/07/2014    Orientation RESPIRATION BLADDER Height & Weight     Self, Time, Situation, Place  Tracheostomy: established 3.5 months   Weight: 120 lb (54.4 kg) Height:  5' 3 (160 cm)  BEHAVIORAL SYMPTOMS/MOOD NEUROLOGICAL BOWEL NUTRITION STATUS        Diet (see dc summary)  AMBULATORY STATUS COMMUNICATION OF NEEDS Skin   Extensive Assist Verbally Surgical wounds                       Personal Care Assistance Level of Assistance  Bathing, Feeding, Dressing Bathing Assistance: Limited assistance Feeding assistance: Limited assistance Dressing Assistance: Limited assistance     Functional Limitations Info  Sight, Hearing, Speech Sight Info:  Adequate Hearing Info: Adequate Speech Info: Impaired    SPECIAL CARE FACTORS FREQUENCY  PT (By licensed PT), OT (By licensed OT)     PT Frequency: 5x a week OT Frequency: 5x week            Contractures Contractures Info: Not present    Additional Factors Info  Code Status, Allergies Code Status Info: Full Allergies Info: Amoxicillin  Gluten Meal  Latex  Serotonin  Serotonin Reuptake Inhibitors (Ssris)           Current Medications (12/05/2023):  This is the current hospital active medication list Current Facility-Administered Medications  Medication Dose Route Frequency Provider Last Rate Last Admin   acetaminophen  (TYLENOL ) tablet 1,000 mg  1,000 mg Oral Q8H Perri DELENA Meliton Mickey., MD   1,000 mg at 12/05/23 1457   artificial tears ophthalmic solution 1 drop  1 drop Both Eyes PRN Perri DELENA Meliton Mickey., MD       cyclobenzaprine  (FLEXERIL ) tablet 10 mg  10 mg Oral TID PRN Danton Lauraine DELENA, PA-C   10 mg at 12/03/23 2134   diphenhydrAMINE  (BENADRYL ) 12.5 MG/5ML elixir 12.5-25 mg  12.5-25 mg Oral Q4H PRN Danton Lauraine DELENA, PA-C       docusate sodium  (COLACE) capsule 100 mg  100 mg Oral BID Danton Lauraine A, PA-C   100 mg at 12/05/23 0935   enoxaparin  (LOVENOX ) injection 40 mg  40 mg Subcutaneous Q24H Danton Lauraine A, PA-C   40 mg at 12/04/23 1701   gabapentin  (NEURONTIN ) capsule  300 mg  300 mg Oral TID Danton Lauraine LABOR, PA-C   300 mg at 12/05/23 1457   levothyroxine  (SYNTHROID ) tablet 50 mcg  50 mcg Oral QAC breakfast Danton Lauraine LABOR, PA-C   50 mcg at 12/05/23 0935   metoCLOPramide  (REGLAN ) tablet 5-10 mg  5-10 mg Oral Q8H PRN Danton Lauraine LABOR, PA-C       Or   metoCLOPramide  (REGLAN ) injection 5-10 mg  5-10 mg Intravenous Q8H PRN Danton Lauraine LABOR, PA-C       morphine  (PF) 2 MG/ML injection 1 mg  1 mg Intravenous Q2H PRN Perri LABOR Meliton Mickey., MD       mupirocin  ointment (BACTROBAN ) 2 % 1 Application  1 Application Nasal BID Danton Lauraine LABOR, PA-C   1 Application at 12/05/23  9065   ondansetron  (ZOFRAN ) tablet 4 mg  4 mg Oral Q6H PRN Danton Lauraine LABOR, PA-C       Or   ondansetron  (ZOFRAN ) injection 4 mg  4 mg Intravenous Q6H PRN McClung, Sarah A, PA-C       oxyCODONE  (Oxy IR/ROXICODONE ) immediate release tablet 5 mg  5 mg Oral Q4H PRN Perri LABOR Meliton Mickey., MD   5 mg at 12/05/23 9065   Or   oxyCODONE  (Oxy IR/ROXICODONE ) immediate release tablet 10 mg  10 mg Oral Q4H PRN Perri LABOR Meliton Mickey., MD   10 mg at 12/04/23 1658   polyethylene glycol (MIRALAX  / GLYCOLAX ) packet 17 g  17 g Oral Daily PRN Danton Lauraine A, PA-C       polyethylene glycol (MIRALAX  / GLYCOLAX ) packet 17 g  17 g Oral BID Perri LABOR Meliton Mickey., MD   17 g at 12/05/23 9065   pramipexole  (MIRAPEX ) tablet 1 mg  1 mg Oral TID Danton Lauraine LABOR, PA-C   1 mg at 12/05/23 1457   progesterone  (PROMETRIUM ) capsule 200 mg  200 mg Oral Daily Danton Lauraine LABOR, PA-C   100 mg at 12/04/23 2122   senna-docusate (Senokot-S) tablet 1 tablet  1 tablet Oral BID Danton Lauraine LABOR, PA-C   1 tablet at 12/05/23 9064     Discharge Medications: Please see discharge summary for a list of discharge medications.  Relevant Imaging Results:  Relevant Lab Results:   Additional Information SSN [id number]  Bridget Cordella Simmonds, LCSW

## 2023-12-06 DIAGNOSIS — S82201A Unspecified fracture of shaft of right tibia, initial encounter for closed fracture: Secondary | ICD-10-CM | POA: Diagnosis not present

## 2023-12-06 MED ORDER — POLYETHYLENE GLYCOL 3350 17 G PO PACK
17.0000 g | PACK | Freq: Three times a day (TID) | ORAL | Status: DC
  Administered 2023-12-06 – 2023-12-07 (×3): 17 g via ORAL
  Filled 2023-12-06 (×4): qty 1

## 2023-12-06 MED ORDER — ACETAMINOPHEN 500 MG PO TABS: 1000.0000 mg | ORAL_TABLET | Freq: Three times a day (TID) | ORAL | Status: AC | PRN

## 2023-12-06 MED ORDER — FLEET ENEMA RE ENEM: 1.0000 | ENEMA | Freq: Once | RECTAL | Status: DC

## 2023-12-06 MED ORDER — POLYETHYLENE GLYCOL 3350 17 G PO PACK: 17.0000 g | PACK | Freq: Two times a day (BID) | ORAL | Status: AC

## 2023-12-06 MED ORDER — OXYCODONE HCL 5 MG PO TABS: 5.0000 mg | ORAL_TABLET | Freq: Four times a day (QID) | ORAL | 0 refills | Status: AC | PRN

## 2023-12-06 MED ORDER — FLEET ENEMA RE ENEM
1.0000 | ENEMA | Freq: Once | RECTAL | Status: AC | PRN
  Administered 2023-12-07: 1 via RECTAL
  Filled 2023-12-06: qty 1

## 2023-12-06 MED ORDER — ASPIRIN 325 MG PO TBEC: 325.0000 mg | DELAYED_RELEASE_TABLET | Freq: Every day | ORAL | 0 refills | Status: AC

## 2023-12-06 MED ORDER — SODIUM CHLORIDE 0.9 % IV BOLUS
500.0000 mL | Freq: Once | INTRAVENOUS | Status: AC
  Administered 2023-12-06: 500 mL via INTRAVENOUS

## 2023-12-06 MED ORDER — PRAMIPEXOLE DIHYDROCHLORIDE 1 MG PO TABS: 1.0000 mg | ORAL_TABLET | Freq: Three times a day (TID) | ORAL | Status: AC

## 2023-12-06 NOTE — TOC Progression Note (Addendum)
 Transition of Care [facility]) - Progression Note    Patient Details  Name: Morgan Reese MRN: [id number] Date of Birth: Jul 06, 1950  Transition of [facility]) CM/SW Contact  Lendia Dais, [place] Phone Number: 12/06/2023, 10:35 AM  Clinical Narrative:  CSW provided bed offers to the pt. The pt will review the offers.   1500: PASSR received: [id number] E  1530: CSW spoke with pt and husband, they do want to accept offer at [facility].  CSW confirmed with [facility] that they can receive pt tomorrow with approved auth.   SNF auth request submitted in navi and approved: [id number], 3 days: 7/16-7/18.   Expected Discharge Plan: Skilled Nursing Facility Barriers to Discharge: Continued Medical Work up, SNF Pending bed offer  Expected Discharge Plan and Services In-house Referral: Clinical Social Work Discharge Planning Services: CM Consult Post Acute Care Choice: Skilled Nursing Facility Living arrangements for the past 2 months: Single Family Home                                       Social Determinants of Health (SDOH) Interventions SDOH Screenings   Food Insecurity: No Food Insecurity (12/01/2023)  Housing: Low Risk  (12/01/2023)  Transportation Needs: No Transportation Needs (12/01/2023)  Utilities: Not At Risk (12/01/2023)  Financial Resource Strain: Low Risk  (11/15/2023)   Received from [facility]  Physical Activity: Inactive (11/15/2023)   Received from [facility]  Social Connections: Moderately Isolated (12/01/2023)  Stress: No Stress Concern Present (11/15/2023)   Received from [facility]  Tobacco Use: Low Risk  (12/02/2023)    Readmission Risk Interventions     No data to display

## 2023-12-06 NOTE — Discharge Summary (Signed)
 Physician Discharge Summary  Morgan Reese FMW:[id number] DOB: [birth year] DOA: 12/01/2023  PCP: Morgan Harvey, MD  Admit date: 12/01/2023 Discharge date: 12/06/2023  Time spent: 40 minutes  Recommendations for Outpatient Follow-up:  Follow outpatient CBC/CMP  Follow with orthopedics in 1-2 weeks Follow with her outpatient neurologist for her chronic medical conditions   Discharge Diagnoses:  Principal Problem:   Fracture of tibial shaft, closed Active Problems:   Closed fibular fracture   Fall   Parkinson's plus syndrome (HCC)   Vocal cord dysfunction   Status post tracheostomy (HCC)   Labile blood pressure   Hypothyroidism   Discharge Condition: stable  Diet recommendation: heart healthy  Filed Weights   12/01/23 1800 12/02/23 0812  Weight: 57.5 kg 54.4 kg    History of present illness:   Morgan Reese is Morgan Reese 73 y.o. female with medical history significant of atypical Parkinson's disease/question of multisystem atrophy, bilateral vocal cord dysfunction s/p tracheostomy, NPH with plans of possible VP shunt in future, and labile blood pressure who sustained Morgan Reese right tibial fracture presents with Morgan Reese right leg fracture following Morgan Reese fall. She is accompanied by her husband and friend.   She sustained Morgan Reese right leg fracture following Morgan Reese fall while getting into Morgan Reese camper. The fall occurred on Tuesday at approximately 3:00 PM. She attempted to step into the camper but was unable to move her leg, resulting in Morgan Reese fall where she hit her head on the door jam twice.  She was taken to [name],  in [place]. Initially, there was Morgan Reese suspicion of Morgan Reese single fracture, but further evaluation revealed three fractures. Surgery was advised but not pursued due to the distance from home. She was transported back home and is currently using Morgan Reese wheelchair for mobility.   She has Morgan Reese history of atypical Parkinson's disease, referred to as 'Parkinson's Plus', and is experiencing tremors. She is  sensitive to carbidopa -levodopa  and is currently taking pramipexole  0.5 mg, two tablets three times Morgan Reese day. She also has Morgan Reese history of vocal cord paralysis, which affects her ability to speak, and is awaiting Morgan Reese procedure to address this issue.   She has Morgan Reese history of hydrocephalus and has previously undergone Morgan Reese ventriculostomy. There is Morgan Reese consideration for Morgan Reese shunt placement in the future.   She's now s/p ORIF right distal tibia fracture.  Stable for discharge when snf bed available.  Hospital Course:  Assessment and Plan:  Right leg tibial and proximal fibular fracture secondary to fall Patient suffered Morgan Reese fall fracturing her right tibia while trying to get into their camper while in [place]  2 days ago.  Returned home and was seen at orthopedics office on the day of admission.   Direct admission was requested with plans for surgery in Morgan Reese.m. - CT with oblique nondisplaced spiral fracture of the distal tibial metadiaphysis, minimal displaced fracture of the proximal fibular metaphysis, punctate lineal cortical fragments and relatively well corticated ossicle adjacent to the tip of the lateral malleolus at the level of the anterolateral joint space may refect sequela of prior ligamentous injury, acute cortical avulsion can't be excluded - now s/p ORIF right distal tibia fracture 7/11 - scheduled APAP, oxy prn, morphine  - miralax  - ortho recommending WB for transfers only on RLE, aspirin  for DVT ppx x4 weeks outpatient.  Follow up in 1-2 weeks with Dr. [name].   Parkinson's plus syndrome Patient is followed by neurology at [facility].  Reports sensitivity to Morgan Reese . - Continue pramipexole     Morgan Reese UTI -  sensitives from OSH pending - sensitive to ceftriaxone , FQ, nitrofurantoin (per my phone call discussion with [employer]).  Will d/c and complete abx for simple UTI with 3 days ceftriaxone .  - secretary to request records   Anisocoria L pupil larger than right, Morgan Reese notes that this can be  seen in parkinson's.  Looks like this might be the most likely explanation .  She reports head CT at OSH was unremarkable (I don't have this report).     Vocal cord dysfunction S/p tracheostomy   Labile blood pressure Patient has labile blood pressures - Avoid any aggressive blood pressure regulation as patient has significant drops.   Hypothyroidism - Continue levothyroxine    HRT - uses estradiol  patch/vaginal estrogen + progesterone  pill    Constipation aggressive regimen     Procedures: Open reduction internal fixation of right distal tibia fracture    Consultations: orthopedics  Discharge Exam: Vitals:   12/06/23 1545 12/06/23 1627  BP: (!) 153/102   Pulse: 89 93  Resp: 16 16  Temp: 97.7 F (36.5 C)   SpO2: 100% 99%   No complaints Friend, husband at bedside  General: No acute distress. Cardiovascular: RRR Lungs: unlabored Neurological: Alert and oriented 3. Moves all extremities 4 with equal strength. Cranial nerves II through XII grossly intact. Extremities: RLE splint  Discharge Instructions   Discharge Instructions     Call MD for:  difficulty breathing, headache or visual disturbances   Complete by: As directed    Call MD for:  extreme fatigue   Complete by: As directed    Call MD for:  hives   Complete by: As directed    Call MD for:  persistant dizziness or light-headedness   Complete by: As directed    Call MD for:  persistant nausea and vomiting   Complete by: As directed    Call MD for:  redness, tenderness, or signs of infection (pain, swelling, redness, odor or green/yellow discharge around incision site)   Complete by: As directed    Call MD for:  severe uncontrolled pain   Complete by: As directed    Call MD for:  temperature >100.4   Complete by: As directed    Diet - low sodium heart healthy   Complete by: As directed    Discharge instructions   Complete by: As directed    You were seen after Morgan Reese right leg tibial and fibular  fracture.    This was surgically repaired by orthopedics.  Continue tylenol  as needed for pain.  Use oxycodone  5-10 mg every 6 hrs as needed for pain not controlled by tylenol .  At discharge, you'll continue aspirin  to help prevent blood clots after surgery.  You'll need to arrange follow up with Dr. [name] in 1-2 weeks.  You can weight bear only for transfers to the right lower extremity.   Return for new, recurrent, or worsening symptoms.  Please ask your PCP to request records from this hospitalization so they know what was done and what the next steps will be.   Discharge wound care:   Complete by: As directed    Per orthopedics   Increase activity slowly   Complete by: As directed       Allergies as of 12/06/2023       Reactions   Amoxicillin Swelling, Other (See Comments)   REACTION: Head felt over-heated Other reaction(s): Other (See Comments) REACTION: Head felt over-heated    REACTION: Head felt over-heated Other reaction(s): Other (See Comments) REACTION:  Head felt over-heated   Gluten Meal Other (See Comments)   Latex Other (See Comments)   Skin irritation   Serotonin Other (See Comments)   Other reaction(s): hyponatremia SSRI's are contraindicated, causes hyponatremia    SSRI's are contraindicated, causes hyponatremia  Other reaction(s): hyponatremia SSRI's are contraindicated, causes hyponatremia Other reaction(s): hyponatremia SSRI's are contraindicated, causes hyponatremia  SSRI's are contraindicated, causes hyponatremia  Other reaction(s): hyponatremia SSRI's are contraindicated, causes hyponatremia   Serotonin Reuptake Inhibitors (ssris) Other (See Comments)   Other reaction(s): hyponatremia SSRI's are contraindicated, causes hyponatremia        Medication List     STOP taking these medications    oxycodone  5 MG capsule Commonly known as: OXY-IR Replaced by: oxyCODONE  5 MG immediate release tablet       TAKE these medications    acetaminophen  500  MG tablet Commonly known as: TYLENOL  Take 2 tablets (1,000 mg total) by mouth every 8 (eight) hours as needed.   aspirin  EC 325 MG tablet Take 1 tablet (325 mg total) by mouth daily.   COPPER PO Take 30 mg by mouth 2 (two) times Janise Gora week.   cyclobenzaprine  10 MG tablet Commonly known as: FLEXERIL  Take 10 mg by mouth 3 (three) times daily as needed.   estradiol  0.0375 MG/24HR Commonly known as: VIVELLE -DOT Place 1 patch onto the skin 2 (two) times Daleigh Pollinger week.   estradiol  0.1 MG/GM vaginal cream Commonly known as: ESTRACE  Place 1 Applicatorful vaginally at bedtime.   Fish Oil 1000 MG Caps Take 1 capsule by mouth at bedtime.   gabapentin  300 MG capsule Commonly known as: NEURONTIN  Take 1 capsule (300 mg total) by mouth 3 (three) times daily.   levothyroxine  50 MCG tablet Commonly known as: SYNTHROID  Take 50 mcg by mouth daily before breakfast.   Magnesium Citrate 200 MG Tabs Take 600 mg by mouth daily.   melatonin 3 MG Tabs tablet Take 6-9 mg by mouth at bedtime.   oxyCODONE  5 MG immediate release tablet Commonly known as: Oxy IR/ROXICODONE  Take 1-2 tablets (5-10 mg total) by mouth every 6 (six) hours as needed for up to 3 days (for pain not controlled by tylenol ). Replaces: oxycodone  5 MG capsule   phenazopyridine 200 MG tablet Commonly known as: PYRIDIUM Take 200 mg by mouth 3 (three) times daily as needed (burning on urination).   polyethylene glycol 17 g packet Commonly known as: MIRALAX  / GLYCOLAX  Take 17 g by mouth 2 (two) times daily.   pramipexole  1 MG tablet Commonly known as: MIRAPEX  Take 1 tablet (1 mg total) by mouth 3 (three) times daily.   progesterone  100 MG capsule Commonly known as: PROMETRIUM  Take 200 mg by mouth at bedtime.   pyridOXINE  50 MG tablet Commonly known as: B-6 Take 50 mg by mouth daily.   thiamine 100 MG tablet Commonly known as: VITAMIN B1 Take 100 mg by mouth daily.   vitamin C 1000 MG tablet Take 1,000 mg by mouth daily.    VITAMIN D3 PO Take by mouth. Alternates 10,000 units with 5,000 units each day.   VITAMIN K PO Take by mouth daily. MK7   zinc  gluconate 50 MG tablet Take 50 mg by mouth daily.               Discharge Care Instructions  (From admission, onward)           Start     Ordered   12/06/23 0000  Discharge wound care:  Comments: Per orthopedics   12/06/23 1700           Allergies  Allergen Reactions   Amoxicillin Swelling and Other (See Comments)    REACTION: Head felt over-heated  Other reaction(s): Other (See Comments)  REACTION: Head felt over-heated    REACTION: Head felt over-heated Other reaction(s): Other (See Comments) REACTION: Head felt over-heated   Gluten Meal Other (See Comments)   Latex Other (See Comments)    Skin irritation   Serotonin Other (See Comments)    Other reaction(s): hyponatremia SSRI's are contraindicated, causes hyponatremia    SSRI's are contraindicated, causes hyponatremia  Other reaction(s): hyponatremia SSRI's are contraindicated, causes hyponatremia  Other reaction(s): hyponatremia SSRI's are contraindicated, causes hyponatremia  SSRI's are contraindicated, causes hyponatremia  Other reaction(s): hyponatremia SSRI's are contraindicated, causes hyponatremia   Serotonin Reuptake Inhibitors (Ssris) Other (See Comments)    Other reaction(s): hyponatremia SSRI's are contraindicated, causes hyponatremia     Contact information for follow-up providers     Haddix, Morgan SQUIBB, MD. Schedule an appointment as soon as possible for Morgan Reese visit in 2 week(s).   Specialty: Orthopedic Surgery Why: for wound check, splint/suture rmeoval, and repeat x-rays Contact information: [address] [place] [address] [phone]         Morgan Harvey, MD Follow up.   Specialty: Family Medicine Contact information: [address] [place] [address] [phone]              Contact information for after-discharge care      Destination     [facility] .   Service: Skilled Nursing Contact information: [address] [place]  [address] [phone]                      The results of significant diagnostics from this hospitalization (including imaging, microbiology, ancillary and laboratory) are listed below for reference.    Significant Diagnostic Studies: DG Tibia/Fibula Right Port Result Date: 12/02/2023 CLINICAL DATA:  Fracture, postop. EXAM: PORTABLE RIGHT TIBIA AND FIBULA - 2 VIEW COMPARISON:  Preoperative CT FINDINGS: Medial plate and screw fixation of distal tibial shaft fracture. Fracture is in near anatomic alignment Known proximal fibular fracture is only faintly visualized on the current exam. Splint material is seen distally. There is generalized soft tissue edema. IMPRESSION: ORIF of distal tibial shaft fracture. Known proximal fibular fracture is only faintly visualized on the current exam. Electronically Signed   By: Andrea Gasman M.D.   On: 12/02/2023 11:38   DG Tibia/Fibula Right Result Date: 12/02/2023 CLINICAL DATA:  Elective surgery. EXAM: RIGHT TIBIA AND FIBULA - 2 VIEW COMPARISON:  Preoperative CT FINDINGS: Six fluoroscopic spot views of the tibia and fibula submitted from the operating room. Medial plate and screw fixation of distal tibial fracture. Fluoroscopy time 1 minutes 7 seconds. Dose 1.57 mGy. IMPRESSION: Intraoperative fluoroscopy during distal tibial fracture fixation. Electronically Signed   By: Andrea Gasman M.D.   On: 12/02/2023 11:36   DG C-Arm 1-60 Min-No Report Result Date: 12/02/2023 Fluoroscopy was utilized by the requesting physician.  No radiographic interpretation.   CT TIBIA FIBULA RIGHT WO CONTRAST Result Date: 12/02/2023 CLINICAL DATA:  Fracture, tib/fib preop planning EXAM: CT OF THE LOWER RIGHT EXTREMITY WITHOUT CONTRAST TECHNIQUE: Multidetector CT imaging of the right lower extremity was performed according to the standard  protocol. RADIATION DOSE REDUCTION: This exam was performed according to the departmental dose-optimization program which includes automated exposure control, adjustment of the mA and/or kV  according to patient size and/or use of iterative reconstruction technique. COMPARISON:  None Available. FINDINGS: Bones/Joint/Cartilage Oblique nondisplaced spiral fracture of the distal tibial metadiaphysis. Proximal fracture margin at the medial tibial diaphysis and distal fracture margin at the anteromedial tibial metaphysis. Minimally displaced fracture of the proximal fibular metaphysis at the level of the fibular neck (series 3, image 39 and series 7, images 30-32). Punctate linear cortical fragments and relatively well corticated ossicle adjacent to the tip of the lateral malleolus at the level of the anterolateral joint space (series 3, images 200 and 201) may reflect sequela of prior ligamentous injury, however, acute cortical avulsion cannot be excluded. No additional fracture identified. Knee is anatomically aligned with mild osteoarthritis. Ankle mortise is congruent. Ligaments Ligaments are suboptimally evaluated by CT. Muscles and Tendons Mild fatty atrophy of the gastrocnemius and soleus muscles. Visualized tendons are intact. No intramuscular fluid collection. Soft tissue Subcutaneous edema extending through the distal shin and calf and hindfoot. No loculated fluid collection. IMPRESSION: 1. Oblique nondisplaced spiral fracture of the distal tibial metadiaphysis. 2. Minimally displaced fracture of the proximal fibular metaphysis. 3. Punctate linear cortical fragments and relatively well corticated ossicle adjacent to the tip of the lateral malleolus at the level of the anterolateral joint space may reflect sequela of prior ligamentous injury, however, acute cortical avulsion cannot be excluded. No additional fracture identified. Electronically Signed   By: Harrietta Sherry M.D.   On: 12/02/2023 08:34     Microbiology: Recent Results (from the past 240 hours)  Surgical PCR screen     Status: None   Collection Time: 12/01/23 10:05 PM   Specimen: Nasal Mucosa; Nasal Swab  Result Value Ref Range Status   MRSA, PCR NEGATIVE NEGATIVE Final   Staphylococcus aureus NEGATIVE NEGATIVE Final    Comment: (NOTE) The Xpert SA Assay (FDA approved for NASAL specimens in patients 75 years of age and older), is one component of Royden Bulman comprehensive surveillance program. It is not intended to diagnose infection nor to guide or monitor treatment. Performed at [facility], [address] [place], [place] [address]      Labs: Basic Metabolic Panel: Recent Labs  Lab 12/01/23 1727 12/03/23 0707 12/04/23 0818  NA 136 139 135  K 3.8 3.3* 4.0  CL 104 107 105  CO2 22 23 22   GLUCOSE 82 100* 84  BUN 18 12 13   CREATININE 0.49 0.56 0.49  CALCIUM 8.8* 8.7* 8.7*  MG  --   --  1.9  PHOS  --   --  3.6   Liver Function Tests: Recent Labs  Lab 12/04/23 0818  AST 19  ALT 10  ALKPHOS 50  BILITOT 0.8  PROT 5.3*  ALBUMIN 2.8*   No results for input(s): LIPASE, AMYLASE in the last 168 hours. No results for input(s): AMMONIA in the last 168 hours. CBC: Recent Labs  Lab 12/01/23 1727 12/03/23 0707 12/04/23 0818  WBC 4.1 6.7 4.4  NEUTROABS  --   --  2.6  HGB 11.6* 11.4* 10.4*  HCT 35.2* 33.5* 31.1*  MCV 93.4 90.8 93.1  PLT 142* 178 171   Cardiac Enzymes: No results for input(s): CKTOTAL, CKMB, CKMBINDEX, TROPONINI in the last 168 hours. BNP: BNP (last 3 results) No results for input(s): BNP in the last 8760 hours.  ProBNP (last 3 results) No results for input(s): PROBNP in the last 8760 hours.  CBG: No results for input(s): GLUCAP in the last 168 hours.     Signed:  Meliton  Perri MD.  [facility] 12/06/2023, 5:01 PM

## 2023-12-06 NOTE — Addendum Note (Signed)
 Addendum  created 12/06/23 0842 by Tilford Franky BIRCH, MD   Attestation recorded in Intraprocedure, Intraprocedure Attestations filed

## 2023-12-06 NOTE — Progress Notes (Addendum)
 BP 89/52, MAP 64, other VS stable.  Pt feels tired. Blondie, NP notified. New order for Sodium Chloride  placed. Bolus completed, BP 121/73, pt denied any symptoms of hypotension at this time.

## 2023-12-06 NOTE — Progress Notes (Signed)
 Physical Therapy Treatment Patient Details Name: Morgan Reese MRN: [id number] DOB: 01-04-1951 Today's Date: 12/06/2023   History of Present Illness Pt is a 73 y.o. female who presented 12/01/23 s/p fall in which she sustained a R tibial fx. S/p ORIF R distal tibial fx 7/11. PMH: atypical Parkinson's disease/question of multisystem atrophy, bilateral vocal cord dysfunction s/p tracheostomy, NPH with plans of possible VP shunt in future, and labile blood pressure    PT Comments  Pt resting in bed on arrival, pleasant and agreeable to session with continued progress towards acute goals.  Session focused on progression of functional transfers with pt able to come to stand with min A x3 throughout session with RW for support and cues for hand placement. Pt able to take steps EOB<>BSC with min A to maintain balance and light cues for LE sequencing. L shoe with brace donned throughout session with noted improvement in standing balance, as L foot equally as elevated as R in splint, and improved LLE clearance during stepping due to DF in brace. Pt performing seated LE exercises for RLE strength maintenance. Patient will benefit from continued inpatient follow up therapy, <3 hours/day and continues to benefit from skilled PT services to progress toward functional mobility goals.      If plan is discharge home, recommend the following: A lot of help with walking and/or transfers;A lot of help with bathing/dressing/bathroom;Assistance with cooking/housework;Assist for transportation;Help with stairs or ramp for entrance;Direct supervision/assist for medications management;Direct supervision/assist for financial management   Can travel by private vehicle     Yes  Equipment Recommendations  BSC/3in1;Wheelchair (measurements PT);Wheelchair cushion (measurements PT);Rolling walker (2 wheels)    Recommendations for Other Services       Precautions / Restrictions Precautions Precautions: Fall Recall of  Precautions/Restrictions: Intact Precaution/Restrictions Comments: Trach Required Braces or Orthoses: Other Brace Other Brace: L foot drop neoprene brace attached to shoe, donned for session Restrictions Weight Bearing Restrictions Per Provider Order: Yes RUE Weight Bearing Per Provider Order: Weight bearing as tolerated Other Position/Activity Restrictions: R LE WBAT for transfers only     Mobility  Bed Mobility Overal bed mobility: Needs Assistance Bed Mobility: Supine to Sit, Sit to Supine     Supine to sit: HOB elevated, Used rails, Supervision Sit to supine: Contact guard assist, Used rails   General bed mobility comments: Pt able to transition supine to sit L EOB with HOB elevated and use of rails with supervision for safety, CGA for return to bed at end of session with cues for repositioing    Transfers Overall transfer level: Needs assistance Equipment used: Rolling walker (2 wheels), Ambulation equipment used Transfers: Bed to chair/wheelchair/BSC, Sit to/from Stand Sit to Stand: Min assist   Step pivot transfers: Min assist       General transfer comment: min A to stand from EOB x2 and BSC x1, cues for hand placement, L shoe with brace donned throughout standing/transfers with improved balance. able to take steps around to [facility] and along EOB to [facility] with min A to maintain balance    Ambulation/Gait               General Gait Details: deferred due to orders for WBAT for transfers only   Stairs             Wheelchair Mobility     Tilt Bed    Modified Rankin (Stroke Patients Only)       Balance Overall balance assessment: Needs assistance Sitting-balance support: No upper  extremity supported, Feet supported Sitting balance-Leahy Scale: Fair     Standing balance support: Bilateral upper extremity supported, During functional activity, Reliant on assistive device for balance Standing balance-Leahy Scale: Poor Standing balance comment: reliant  on RW                            Communication Communication Communication: Impaired Factors Affecting Communication: Trach/intubated  Cognition Arousal: Alert Behavior During Therapy: WFL for tasks assessed/performed   PT - Cognitive impairments: No apparent impairments                       PT - Cognition Comments: very soft voice due to trach, appropraite with all questions and conversation Following commands: Impaired Following commands impaired: Follows multi-step commands with increased time    Cueing Cueing Techniques: Verbal cues, Tactile cues  Exercises General Exercises - Lower Extremity Long Arc Quad: AROM, Right, 10 reps, Seated (with 2 seconds hold at end range) Hip Flexion/Marching: AROM, Right, 10 reps, Seated    General Comments General comments (skin integrity, edema, etc.): VSS on RA      Pertinent Vitals/Pain Pain Assessment Pain Assessment: Faces Faces Pain Scale: Hurts little more Pain Location: R foot Pain Descriptors / Indicators: Sore Pain Intervention(s): Premedicated before session, Monitored during session, Limited activity within patient's tolerance    Home Living                          Prior Function            PT Goals (current goals can now be found in the care plan section) Acute Rehab PT Goals Patient Stated Goal: to go to rehab PT Goal Formulation: With patient/family Time For Goal Achievement: 12/17/23 Progress towards PT goals: Progressing toward goals    Frequency    Min 2X/week      PT Plan      Co-evaluation              AM-PAC PT 6 Clicks Mobility   Outcome Measure  Help needed turning from your back to your side while in a flat bed without using bedrails?: A Little Help needed moving from lying on your back to sitting on the side of a flat bed without using bedrails?: A Little Help needed moving to and from a bed to a chair (including a wheelchair)?: A Little Help needed  standing up from a chair using your arms (e.g., wheelchair or bedside chair)?: A Little Help needed to walk in hospital room?: Total Help needed climbing 3-5 steps with a railing? : Total 6 Click Score: 14    End of Session Equipment Utilized During Treatment: Gait belt Activity Tolerance: Patient tolerated treatment well Patient left: with call bell/phone within reach;in bed Nurse Communication: Mobility status PT Visit Diagnosis: Unsteadiness on feet (R26.81);Other abnormalities of gait and mobility (R26.89);Muscle weakness (generalized) (M62.81);Difficulty in walking, not elsewhere classified (R26.2);Pain Pain - Right/Left: Right Pain - part of body: Leg     Time: 1127-1150 PT Time Calculation (min) (ACUTE ONLY): 23 min  Charges:    $Therapeutic Exercise: 8-22 mins $Therapeutic Activity: 8-22 mins PT General Charges $$ ACUTE PT VISIT: 1 Visit                     Shakayla Hickox R. PTA Acute Rehabilitation Services Office: [phone]   Therisa CHRISTELLA Boor 12/06/2023, 12:48 PM

## 2023-12-07 DIAGNOSIS — S82241A Displaced spiral fracture of shaft of right tibia, initial encounter for closed fracture: Secondary | ICD-10-CM | POA: Diagnosis not present

## 2023-12-07 LAB — CBC
HCT: 31.5 % — ABNORMAL LOW (ref 36.0–46.0)
Hemoglobin: 10.5 g/dL — ABNORMAL LOW (ref 12.0–15.0)
MCH: 30.4 pg (ref 26.0–34.0)
MCHC: 33.3 g/dL (ref 30.0–36.0)
MCV: 91.3 fL (ref 80.0–100.0)
Platelets: 191 K/uL (ref 150–400)
RBC: 3.45 MIL/uL — ABNORMAL LOW (ref 3.87–5.11)
RDW: 13 % (ref 11.5–15.5)
WBC: 4 K/uL (ref 4.0–10.5)
nRBC: 0 % (ref 0.0–0.2)

## 2023-12-07 LAB — BASIC METABOLIC PANEL WITH GFR
Anion gap: 10 (ref 5–15)
BUN: 11 mg/dL (ref 8–23)
CO2: 24 mmol/L (ref 22–32)
Calcium: 8.6 mg/dL — ABNORMAL LOW (ref 8.9–10.3)
Chloride: 103 mmol/L (ref 98–111)
Creatinine, Ser: 0.51 mg/dL (ref 0.44–1.00)
GFR, Estimated: >60 mL/min (ref >60)
Glucose, Bld: 94 mg/dL (ref 70–99)
Potassium: 3.8 mmol/L (ref 3.5–5.1)
Sodium: 137 mmol/L (ref 135–145)

## 2023-12-07 NOTE — Discharge Summary (Addendum)
 Physician Discharge Summary  [name] FMW:[id number] DOB: December 13, 1950 DOA: 12/01/2023  PCP: Aisha Harvey, MD  Admit date: 12/01/2023 Discharge date: 12/07/2023  Time spent: 40 minutes  Recommendations for Outpatient Follow-up:  Follow outpatient CBC/CMP  Follow with orthopedics in 1-2 weeks Follow with her outpatient neurologist for her chronic medical conditions   Discharge Diagnoses:  Principal Problem:   Fracture of tibial shaft, closed Active Problems:   Closed fibular fracture   Fall   Parkinson's plus syndrome (HCC)   Vocal cord dysfunction   Status post tracheostomy (HCC)   Labile blood pressure   Hypothyroidism   Discharge Condition: stable  Diet recommendation: heart healthy  Filed Weights   12/01/23 1800 12/02/23 0812  Weight: 57.5 kg 54.4 kg    History of present illness:   Morgan Reese is a 73 y.o. female with medical history significant of atypical Parkinson's disease/question of multisystem atrophy, bilateral vocal cord dysfunction s/p tracheostomy, NPH with plans of possible VP shunt in future, and labile blood pressure who sustained a right tibial fracture presents with a right leg fracture following a fall. She is accompanied by her husband and friend.   She has a history of atypical Parkinson's disease, referred to as 'Parkinson's Plus', and is experiencing tremors. She is sensitive to carbidopa -levodopa  and is currently taking pramipexole  0.5 mg, two tablets three times a day. She also has a history of vocal cord paralysis, which affects her ability to speak, and is awaiting a procedure to address this issue.   She has a history of hydrocephalus and has previously undergone a ventriculostomy. There is a consideration for a shunt placement in the future.   Hospital Course:  Assessment and Plan:  Right leg tibial and proximal fibular fracture secondary to fall Patient suffered a fall fracturing her right tibia while trying to get into their  camper while in [place]  2 days ago.  Returned home and was seen at orthopedics office on the day of admission.   Direct admission was requested with plans for surgery in a.m. - CT with oblique nondisplaced spiral fracture of the distal tibial metadiaphysis, minimal displaced fracture of the proximal fibular metaphysis, punctate lineal cortical fragments and relatively well corticated ossicle adjacent to the tip of the lateral malleolus at the level of the anterolateral joint space may refect sequela of prior ligamentous injury, acute cortical avulsion can't be excluded - now s/p ORIF right distal tibia fracture 7/11 - scheduled APAP, oxy prn, morphine  - miralax  - ortho recommending WB for transfers only on RLE, aspirin  for DVT ppx x4 weeks outpatient.  Follow up in 1-2 weeks with Dr. [name].   Parkinson's plus syndrome Patient is followed by neurology at [facility].  Reports sensitivity to Sinemet . - Continue pramipexole     [name] UTI - sensitives from OSH pending - sensitive to ceftriaxone , FQ, nitrofurantoin (per my phone call discussion with [employer]).  Will d/c and complete abx for simple UTI with 3 days ceftriaxone .  - secretary to request records   Anisocoria L pupil larger than right, [name] notes that this can be seen in parkinson's.  Looks like this might be the most likely explanation .  She reports head CT at OSH was unremarkable (I don't have this report).     Vocal cord dysfunction S/p tracheostomy   Labile blood pressure Patient has labile blood pressures - Avoid any aggressive blood pressure regulation as patient has significant drops.   Hypothyroidism - Continue levothyroxine    HRT -  uses estradiol  patch/vaginal estrogen + progesterone  pill    Constipation aggressive regimen    Procedures: Open reduction internal fixation of right distal tibia fracture    Consultations: orthopedics  Discharge Exam: Vitals:   12/07/23 0846 12/07/23 1158  BP:     Pulse: 95 89  Resp: 16 16  Temp:    SpO2: 100% 100%   No complaints Friend, husband at bedside  General: No acute distress. Cardiovascular: RRR Lungs: unlabored Neurological: Alert and oriented 3. Moves all extremities 4 with equal strength. Cranial nerves II through XII grossly intact. Extremities: RLE splint  Discharge Instructions   Discharge Instructions     Call MD for:  difficulty breathing, headache or visual disturbances   Complete by: As directed    Call MD for:  extreme fatigue   Complete by: As directed    Call MD for:  hives   Complete by: As directed    Call MD for:  persistant dizziness or light-headedness   Complete by: As directed    Call MD for:  persistant nausea and vomiting   Complete by: As directed    Call MD for:  redness, tenderness, or signs of infection (pain, swelling, redness, odor or green/yellow discharge around incision site)   Complete by: As directed    Call MD for:  severe uncontrolled pain   Complete by: As directed    Call MD for:  temperature >100.4   Complete by: As directed    Diet - low sodium heart healthy   Complete by: As directed    Diet - low sodium heart healthy   Complete by: As directed    Discharge instructions   Complete by: As directed    You were seen after a right leg tibial and fibular fracture.    This was surgically repaired by orthopedics.  Continue tylenol  as needed for pain.  Use oxycodone  5-10 mg every 6 hrs as needed for pain not controlled by tylenol .  At discharge, you'll continue aspirin  to help prevent blood clots after surgery.  You'll need to arrange follow up with Dr. [name] in 1-2 weeks.  You can weight bear only for transfers to the right lower extremity.   Return for new, recurrent, or worsening symptoms.  Please ask your PCP to request records from this hospitalization so they know what was done and what the next steps will be.   Discharge wound care:   Complete by: As directed    Per  orthopedics   Increase activity slowly   Complete by: As directed    Increase activity slowly   Complete by: As directed    No wound care   Complete by: As directed       Allergies as of 12/07/2023       Reactions   Amoxicillin Swelling, Other (See Comments)   REACTION: Head felt over-heated Other reaction(s): Other (See Comments) REACTION: Head felt over-heated    REACTION: Head felt over-heated Other reaction(s): Other (See Comments) REACTION: Head felt over-heated   Gluten Meal Other (See Comments)   Latex Other (See Comments)   Skin irritation   Serotonin Other (See Comments)   Other reaction(s): hyponatremia SSRI's are contraindicated, causes hyponatremia    SSRI's are contraindicated, causes hyponatremia  Other reaction(s): hyponatremia SSRI's are contraindicated, causes hyponatremia Other reaction(s): hyponatremia SSRI's are contraindicated, causes hyponatremia  SSRI's are contraindicated, causes hyponatremia  Other reaction(s): hyponatremia SSRI's are contraindicated, causes hyponatremia   Serotonin Reuptake Inhibitors (ssris) Other (See Comments)  Other reaction(s): hyponatremia SSRI's are contraindicated, causes hyponatremia        Medication List     STOP taking these medications    oxycodone  5 MG capsule Commonly known as: OXY-IR Replaced by: oxyCODONE  5 MG immediate release tablet       TAKE these medications    acetaminophen  500 MG tablet Commonly known as: TYLENOL  Take 2 tablets (1,000 mg total) by mouth every 8 (eight) hours as needed.   aspirin  EC 325 MG tablet Take 1 tablet (325 mg total) by mouth daily.   COPPER PO Take 30 mg by mouth 2 (two) times a week.   cyclobenzaprine  10 MG tablet Commonly known as: FLEXERIL  Take 10 mg by mouth 3 (three) times daily as needed.   estradiol  0.0375 MG/24HR Commonly known as: VIVELLE -DOT Place 1 patch onto the skin 2 (two) times a week.   estradiol  0.1 MG/GM vaginal cream Commonly known as:  ESTRACE  Place 1 Applicatorful vaginally at bedtime.   Fish Oil 1000 MG Caps Take 1 capsule by mouth at bedtime.   gabapentin  300 MG capsule Commonly known as: NEURONTIN  Take 1 capsule (300 mg total) by mouth 3 (three) times daily.   levothyroxine  50 MCG tablet Commonly known as: SYNTHROID  Take 50 mcg by mouth daily before breakfast.   Magnesium Citrate 200 MG Tabs Take 600 mg by mouth daily.   melatonin 3 MG Tabs tablet Take 6-9 mg by mouth at bedtime.   oxyCODONE  5 MG immediate release tablet Commonly known as: Oxy IR/ROXICODONE  Take 1-2 tablets (5-10 mg total) by mouth every 6 (six) hours as needed for up to 3 days (for pain not controlled by tylenol ). Replaces: oxycodone  5 MG capsule   phenazopyridine 200 MG tablet Commonly known as: PYRIDIUM Take 200 mg by mouth 3 (three) times daily as needed (burning on urination).   polyethylene glycol 17 g packet Commonly known as: MIRALAX  / GLYCOLAX  Take 17 g by mouth 2 (two) times daily.   pramipexole  1 MG tablet Commonly known as: MIRAPEX  Take 1 tablet (1 mg total) by mouth 3 (three) times daily.   progesterone  100 MG capsule Commonly known as: PROMETRIUM  Take 200 mg by mouth at bedtime.   pyridOXINE  50 MG tablet Commonly known as: B-6 Take 50 mg by mouth daily.   thiamine 100 MG tablet Commonly known as: VITAMIN B1 Take 100 mg by mouth daily.   vitamin C 1000 MG tablet Take 1,000 mg by mouth daily.   VITAMIN D3 PO Take by mouth. Alternates 10,000 units with 5,000 units each day.   VITAMIN K PO Take by mouth daily. MK7   zinc  gluconate 50 MG tablet Take 50 mg by mouth daily.               Discharge Care Instructions  (From admission, onward)           Start     Ordered   12/06/23 0000  Discharge wound care:       Comments: Per orthopedics   12/06/23 1700           Allergies  Allergen Reactions   Amoxicillin Swelling and Other (See Comments)    REACTION: Head felt over-heated  Other  reaction(s): Other (See Comments)  REACTION: Head felt over-heated    REACTION: Head felt over-heated Other reaction(s): Other (See Comments) REACTION: Head felt over-heated   Gluten Meal Other (See Comments)   Latex Other (See Comments)    Skin irritation   Serotonin Other (See Comments)  Other reaction(s): hyponatremia SSRI's are contraindicated, causes hyponatremia    SSRI's are contraindicated, causes hyponatremia  Other reaction(s): hyponatremia SSRI's are contraindicated, causes hyponatremia  Other reaction(s): hyponatremia SSRI's are contraindicated, causes hyponatremia  SSRI's are contraindicated, causes hyponatremia  Other reaction(s): hyponatremia SSRI's are contraindicated, causes hyponatremia   Serotonin Reuptake Inhibitors (Ssris) Other (See Comments)    Other reaction(s): hyponatremia SSRI's are contraindicated, causes hyponatremia     Contact information for follow-up providers     Haddix, Franky SQUIBB, MD. Schedule an appointment as soon as possible for a visit in 2 week(s).   Specialty: Orthopedic Surgery Why: for wound check, splint/suture rmeoval, and repeat x-rays Contact information: [address] [place] [address] [phone]         Aisha Harvey, MD Follow up.   Specialty: Family Medicine Contact information: [address] [place] [address] [phone]              Contact information for after-discharge care     Destination     [facility] .   Service: Skilled Nursing Contact information: [address] [place]  [address] [phone]                      The results of significant diagnostics from this hospitalization (including imaging, microbiology, ancillary and laboratory) are listed below for reference.    Significant Diagnostic Studies: DG Tibia/Fibula Right Port Result Date: 12/02/2023 CLINICAL DATA:  Fracture, postop. EXAM: PORTABLE RIGHT TIBIA AND FIBULA - 2 VIEW COMPARISON:   Preoperative CT FINDINGS: Medial plate and screw fixation of distal tibial shaft fracture. Fracture is in near anatomic alignment Known proximal fibular fracture is only faintly visualized on the current exam. Splint material is seen distally. There is generalized soft tissue edema. IMPRESSION: ORIF of distal tibial shaft fracture. Known proximal fibular fracture is only faintly visualized on the current exam. Electronically Signed   By: Andrea Gasman M.D.   On: 12/02/2023 11:38   DG Tibia/Fibula Right Result Date: 12/02/2023 CLINICAL DATA:  Elective surgery. EXAM: RIGHT TIBIA AND FIBULA - 2 VIEW COMPARISON:  Preoperative CT FINDINGS: Six fluoroscopic spot views of the tibia and fibula submitted from the operating room. Medial plate and screw fixation of distal tibial fracture. Fluoroscopy time 1 minutes 7 seconds. Dose 1.57 mGy. IMPRESSION: Intraoperative fluoroscopy during distal tibial fracture fixation. Electronically Signed   By: Andrea Gasman M.D.   On: 12/02/2023 11:36   DG C-Arm 1-60 Min-No Report Result Date: 12/02/2023 Fluoroscopy was utilized by the requesting physician.  No radiographic interpretation.   CT TIBIA FIBULA RIGHT WO CONTRAST Result Date: 12/02/2023 CLINICAL DATA:  Fracture, tib/fib preop planning EXAM: CT OF THE LOWER RIGHT EXTREMITY WITHOUT CONTRAST TECHNIQUE: Multidetector CT imaging of the right lower extremity was performed according to the standard protocol. RADIATION DOSE REDUCTION: This exam was performed according to the departmental dose-optimization program which includes automated exposure control, adjustment of the mA and/or kV according to patient size and/or use of iterative reconstruction technique. COMPARISON:  None Available. FINDINGS: Bones/Joint/Cartilage Oblique nondisplaced spiral fracture of the distal tibial metadiaphysis. Proximal fracture margin at the medial tibial diaphysis and distal fracture margin at the anteromedial tibial metaphysis. Minimally  displaced fracture of the proximal fibular metaphysis at the level of the fibular neck (series 3, image 39 and series 7, images 30-32). Punctate linear cortical fragments and relatively well corticated ossicle adjacent to the tip of the lateral malleolus at the level of the anterolateral joint  space (series 3, images 200 and 201) may reflect sequela of prior ligamentous injury, however, acute cortical avulsion cannot be excluded. No additional fracture identified. Knee is anatomically aligned with mild osteoarthritis. Ankle mortise is congruent. Ligaments Ligaments are suboptimally evaluated by CT. Muscles and Tendons Mild fatty atrophy of the gastrocnemius and soleus muscles. Visualized tendons are intact. No intramuscular fluid collection. Soft tissue Subcutaneous edema extending through the distal shin and calf and hindfoot. No loculated fluid collection. IMPRESSION: 1. Oblique nondisplaced spiral fracture of the distal tibial metadiaphysis. 2. Minimally displaced fracture of the proximal fibular metaphysis. 3. Punctate linear cortical fragments and relatively well corticated ossicle adjacent to the tip of the lateral malleolus at the level of the anterolateral joint space may reflect sequela of prior ligamentous injury, however, acute cortical avulsion cannot be excluded. No additional fracture identified. Electronically Signed   By: Harrietta Sherry M.D.   On: 12/02/2023 08:34    Microbiology: Recent Results (from the past 240 hours)  Surgical PCR screen     Status: None   Collection Time: 12/01/23 10:05 PM   Specimen: Nasal Mucosa; Nasal Swab  Result Value Ref Range Status   MRSA, PCR NEGATIVE NEGATIVE Final   Staphylococcus aureus NEGATIVE NEGATIVE Final    Comment: (NOTE) The Xpert SA Assay (FDA approved for NASAL specimens in patients 66 years of age and older), is one component of a comprehensive surveillance program. It is not intended to diagnose infection nor to guide or monitor  treatment. Performed at [facility], [address] [place], [place] [address]      Labs: Basic Metabolic Panel: Recent Labs  Lab 12/01/23 1727 12/03/23 0707 12/04/23 0818 12/07/23 0700  NA 136 139 135 137  K 3.8 3.3* 4.0 3.8  CL 104 107 105 103  CO2 22 23 22 24   GLUCOSE 82 100* 84 94  BUN 18 12 13 11   CREATININE 0.49 0.56 0.49 0.51  CALCIUM 8.8* 8.7* 8.7* 8.6*  MG  --   --  1.9  --   PHOS  --   --  3.6  --    Liver Function Tests: Recent Labs  Lab 12/04/23 0818  AST 19  ALT 10  ALKPHOS 50  BILITOT 0.8  PROT 5.3*  ALBUMIN 2.8*   No results for input(s): LIPASE, AMYLASE in the last 168 hours. No results for input(s): AMMONIA in the last 168 hours. CBC: Recent Labs  Lab 12/01/23 1727 12/03/23 0707 12/04/23 0818 12/07/23 0700  WBC 4.1 6.7 4.4 4.0  NEUTROABS  --   --  2.6  --   HGB 11.6* 11.4* 10.4* 10.5*  HCT 35.2* 33.5* 31.1* 31.5*  MCV 93.4 90.8 93.1 91.3  PLT 142* 178 171 191   Cardiac Enzymes: No results for input(s): CKTOTAL, CKMB, CKMBINDEX, TROPONINI in the last 168 hours. BNP: BNP (last 3 results) No results for input(s): BNP in the last 8760 hours.  ProBNP (last 3 results) No results for input(s): PROBNP in the last 8760 hours.  CBG: No results for input(s): GLUCAP in the last 168 hours.     Signed:  Erle Odell Castor MD.  [facility] 12/07/2023, 12:34 PM

## 2023-12-07 NOTE — Progress Notes (Signed)
 Mobility Specialist Progress Note:    12/07/23 0930  Mobility  Activity Transferred from bed to chair  Level of Assistance Minimal assist, patient does 75% or more  Assistive Device Front wheel walker  Distance Ambulated (ft) 3 ft  RUE Weight Bearing Per Provider Order WBAT  Activity Response Tolerated well  Mobility Referral Yes  Mobility visit 1 Mobility  Mobility Specialist Start Time (ACUTE ONLY) 0915  Mobility Specialist Stop Time (ACUTE ONLY) 0930  Mobility Specialist Time Calculation (min) (ACUTE ONLY) 15 min   Pt received in bed agreeable to mobility. Pt required supervision for bed mobility and MinA for STS. Pt was able to transfer to the chair w/o fault. Left in chair w/ call bell and personal belongings in reach. All needs met. Chair alarm on.   Thersia Minder Mobility Specialist  Please contact vis Secure Chat or  Rehab Office [phone]

## 2023-12-07 NOTE — Telephone Encounter (Signed)
 Triage nurse called back patient's husband, Morgan Reese, regarding earlier voicemail.  He reports that Morgan Reese recently fell and broke her hip. She has been approved by her insurance for PT at a rehab facility; however, none of the rehabs in [place] will take care of a trach patient. Her trach is not new. She got it on 08/18/23.   She currently has a SW and PT through home health services, but patient's husband thinks she needs more intense PT to recuperate. Grayce, do you know of any area rehabs that will take a trach patient?

## 2023-12-07 NOTE — Progress Notes (Signed)
 Physical Therapy Treatment Patient Details Name: Morgan Reese MRN: [id number] DOB: [birth year] Today's Date: 12/07/2023   History of Present Illness Pt is a 73 y.o. female who presented 12/01/23 s/p fall in which she sustained a R tibial fx. S/p ORIF R distal tibial fx 7/11. PMH: atypical Parkinson's disease/question of multisystem atrophy, bilateral vocal cord dysfunction s/p tracheostomy, NPH with plans of possible VP shunt in future, and labile blood pressure    PT Comments  Pt up in recliner chair on arrival and agreeable to session with continued progress towards acute goals. Pt able to come to stand initially with min A to facilitate anterior weight shift, fading to CGA with x3 subsequent stands with pt demonstrating good hand placement and fair eccentric control to sit, however continues to benefit from cues for hand placement when sitting. Pt able to step pivot chair <> BSC with CGA for safety with L shoe and brace donned with pt demonstrating good LE clearance. Pt able to perform standing pre-gait activities with RLE for increased ROM and strength maintenance. Pt continues to benefit from skilled PT services to progress toward functional mobility goals.       If plan is discharge home, recommend the following: A lot of help with walking and/or transfers;A lot of help with bathing/dressing/bathroom;Assistance with cooking/housework;Assist for transportation;Help with stairs or ramp for entrance;Direct supervision/assist for medications management;Direct supervision/assist for financial management   Can travel by private vehicle     Yes  Equipment Recommendations  BSC/3in1;Wheelchair (measurements PT);Wheelchair cushion (measurements PT);Rolling walker (2 wheels)    Recommendations for Other Services       Precautions / Restrictions Precautions Precautions: Fall Recall of Precautions/Restrictions: Intact Precaution/Restrictions Comments: Trach Required Braces or Orthoses: Other  Brace Other Brace: L foot drop neoprene brace attached to shoe, donned for session Restrictions Weight Bearing Restrictions Per Provider Order: Yes RUE Weight Bearing Per Provider Order: Weight bearing as tolerated Other Position/Activity Restrictions: R LE WBAT for transfers only     Mobility  Bed Mobility Overal bed mobility: Needs Assistance             General bed mobility comments: pt up in recliner on arrival and at end of session    Transfers Overall transfer level: Needs assistance Equipment used: Rolling walker (2 wheels), Ambulation equipment used Transfers: Bed to chair/wheelchair/BSC, Sit to/from Stand Sit to Stand: Min assist, Contact guard assist           General transfer comment: light min A to facilitate anterior weight shift on initial stand, able to stand x2 more times from chair with CGA and BSC with CGA, steady steps from chair <>BSC    Ambulation/Gait             Pre-gait activities: standing marching RLE x10 for increased ROM and RLE strength     Stairs             Wheelchair Mobility     Tilt Bed    Modified Rankin (Stroke Patients Only)       Balance Overall balance assessment: Needs assistance Sitting-balance support: No upper extremity supported, Feet supported Sitting balance-Leahy Scale: Fair     Standing balance support: Bilateral upper extremity supported, During functional activity, Reliant on assistive device for balance Standing balance-Leahy Scale: Poor Standing balance comment: reliant on RW                            Communication Communication Communication: Impaired  Factors Affecting Communication: Trach/intubated  Cognition Arousal: Alert Behavior During Therapy: WFL for tasks assessed/performed   PT - Cognitive impairments: No apparent impairments Difficult to assess due to: Tracheostomy                     PT - Cognition Comments: very soft voice due to trach, appropraite  with all questions and conversation Following commands: Impaired Following commands impaired: Follows multi-step commands with increased time    Cueing Cueing Techniques: Verbal cues, Tactile cues  Exercises      General Comments General comments (skin integrity, edema, etc.): VSS on RA, pt spouse and daughter present and supportice      Pertinent Vitals/Pain Pain Assessment Pain Assessment: Faces Faces Pain Scale: Hurts a little bit Pain Location: R foot Pain Descriptors / Indicators: Sore Pain Intervention(s): Monitored during session, Limited activity within patient's tolerance    Home Living                          Prior Function            PT Goals (current goals can now be found in the care plan section) Acute Rehab PT Goals Patient Stated Goal: to go to rehab PT Goal Formulation: With patient/family Time For Goal Achievement: 12/17/23 Progress towards PT goals: Progressing toward goals    Frequency    Min 2X/week      PT Plan      Co-evaluation              AM-PAC PT 6 Clicks Mobility   Outcome Measure  Help needed turning from your back to your side while in a flat bed without using bedrails?: A Little Help needed moving from lying on your back to sitting on the side of a flat bed without using bedrails?: A Little Help needed moving to and from a bed to a chair (including a wheelchair)?: A Little Help needed standing up from a chair using your arms (e.g., wheelchair or bedside chair)?: A Little Help needed to walk in hospital room?: Total Help needed climbing 3-5 steps with a railing? : Total 6 Click Score: 14    End of Session Equipment Utilized During Treatment: Gait belt Activity Tolerance: Patient tolerated treatment well Patient left: with call bell/phone within reach;in chair;with chair alarm set;with family/visitor present Nurse Communication: Mobility status PT Visit Diagnosis: Unsteadiness on feet (R26.81);Other  abnormalities of gait and mobility (R26.89);Muscle weakness (generalized) (M62.81);Difficulty in walking, not elsewhere classified (R26.2);Pain Pain - Right/Left: Right Pain - part of body: Leg     Time: 8660-8597 PT Time Calculation (min) (ACUTE ONLY): 23 min  Charges:    $Therapeutic Exercise: 8-22 mins $Therapeutic Activity: 8-22 mins PT General Charges $$ ACUTE PT VISIT: 1 Visit                     Cruz Devilla R. PTA Acute Rehabilitation Services Office: [phone]   Morgan Reese 12/07/2023, 3:41 PM

## 2023-12-07 NOTE — TOC Progression Note (Addendum)
 Transition of Care [facility]) - Progression Note    Patient Details  Name: Morgan Reese MRN: [id number] Date of Birth: [birth year]  Transition of Care [facility]) CM/SW Contact  Bridget Cordella Simmonds, LCSW Phone Number: 12/07/2023, 3:14 PM  Clinical Narrative:   CSW attempted to confirm with [facility] that  they can receive pt today--[facility] has to rescind bed offer as they overlooked that pt has trach.  Not able to accept pt with trach.  Pt and husband informed.  They do not want to accept offer at [facility].  Will consider other options.  CSW spoke with [facility]: they cannot accept trach pt. [facility] is reviewing.  Message sent to other SNF that have not responded in hub.    1000: No additional offers.  Pt and husband updated, they are asking that referral be sent out to farther away, would like higher rated facility. This was done.  1300: RNCM asked to speak with family about potential DC home, as pt only WBAT for transfers.  Family does not want to DC home.  Husband spoke with [facility] [phone]) and was advised they take trach pt.  Referral faxed to [phone]. [facility] does offer bed.       Expected Discharge Plan: Skilled Nursing Facility Barriers to Discharge: Continued Medical Work up, SNF Pending bed offer  Expected Discharge Plan and Services In-house Referral: Clinical Social Work Discharge Planning Services: CM Consult Post Acute Care Choice: Skilled Nursing Facility Living arrangements for the past 2 months: Single Family Home Expected Discharge Date: 12/07/23                                     Social Determinants of Health (SDOH) Interventions SDOH Screenings   Food Insecurity: No Food Insecurity (12/01/2023)  Housing: Low Risk  (12/01/2023)  Transportation Needs: No Transportation Needs (12/01/2023)  Utilities: Not At Risk (12/01/2023)  Financial Resource Strain: Low Risk  (11/15/2023)   Received from [facility]   Physical Activity: Inactive (11/15/2023)   Received from [facility]  Social Connections: Moderately Isolated (12/01/2023)  Stress: No Stress Concern Present (11/15/2023)   Received from [facility]  Tobacco Use: Low Risk  (12/02/2023)    Readmission Risk Interventions     No data to display

## 2023-12-08 DIAGNOSIS — S82241A Displaced spiral fracture of shaft of right tibia, initial encounter for closed fracture: Secondary | ICD-10-CM | POA: Diagnosis not present

## 2023-12-08 MED ORDER — POLYETHYLENE GLYCOL 3350 17 G PO PACK: 17.0000 g | PACK | Freq: Three times a day (TID) | ORAL | Status: DC

## 2023-12-08 MED ORDER — POLYETHYLENE GLYCOL 3350 17 G PO PACK: 17.0000 g | PACK | Freq: Every day | ORAL | 0 refills | Status: AC | PRN

## 2023-12-08 MED ORDER — POLYETHYLENE GLYCOL 3350 17 G PO PACK
17.0000 g | PACK | Freq: Two times a day (BID) | ORAL | Status: DC
  Administered 2023-12-08 – 2023-12-09 (×2): 17 g via ORAL
  Filled 2023-12-08 (×3): qty 1

## 2023-12-08 NOTE — Progress Notes (Signed)
 Mobility Specialist: Progress Note   12/08/23 1544  Mobility  Activity Transferred from bed to chair  Level of Assistance Contact guard assist, steadying assist  Assistive Device Front wheel walker  RUE Weight Bearing Per Provider Order WBAT  Activity Response Tolerated well  Mobility Referral Yes  Mobility visit 1 Mobility  Mobility Specialist Start Time (ACUTE ONLY) 1134  Mobility Specialist Stop Time (ACUTE ONLY) 1143  Mobility Specialist Time Calculation (min) (ACUTE ONLY) 9 min    Pt received in bed, pleasant and agreeable to mobility session. SV for bed mobility. MinA for STS, minG for transfer to chair. No complaints. Left in chair with all needs met, call bell in reach. Husband present.   Ileana Lute Mobility Specialist Please contact via SecureChat or Rehab office at [phone]

## 2023-12-08 NOTE — TOC Progression Note (Addendum)
 Transition of Care [facility]) - Progression Note    Patient Details  Name: Morgan Reese MRN: [id number] Date of Birth: 11/18/1950  Transition of Care [facility]) CM/SW Contact  Gwenn Frieze [place], [place] Phone Number: 12/08/2023, 2:48 PM  Clinical Narrative: Call received from Amber in [facility] admissions who reports they are not able to extend a bed offer. Updated pt's husband and he has accepted [facility]. Confirmed bed with Kia at [facility]. Attempting to transfer auth from [facility] to [facility]. Will provide updates as available.   Frieze Gwenn, MSW, LCSW [phone] (coverage)        Expected Discharge Plan: Skilled Nursing Facility Barriers to Discharge: Continued Medical Work up, SNF Pending bed offer  Expected Discharge Plan and Services In-house Referral: Clinical Social Work Discharge Planning Services: CM Consult Post Acute Care Choice: Skilled Nursing Facility Living arrangements for the past 2 months: Single Family Home Expected Discharge Date: 12/07/23                                     Social Determinants of Health (SDOH) Interventions SDOH Screenings   Food Insecurity: No Food Insecurity (12/01/2023)  Housing: Low Risk  (12/01/2023)  Transportation Needs: No Transportation Needs (12/01/2023)  Utilities: Not At Risk (12/01/2023)  Financial Resource Strain: Low Risk  (11/15/2023)   Received from [facility]  Physical Activity: Inactive (11/15/2023)   Received from [facility]  Social Connections: Moderately Isolated (12/01/2023)  Stress: No Stress Concern Present (11/15/2023)   Received from [facility]  Tobacco Use: Low Risk  (12/02/2023)    Readmission Risk Interventions     No data to display

## 2023-12-08 NOTE — TOC Progression Note (Signed)
 Transition of Care [facility]) - Progression Note    Patient Details  Name: Morgan Reese MRN: [id number] Date of Birth: 11/06/1950  Transition of Care [facility]) CM/SW Contact  Gwenn Frieze [place], [place] Phone Number: 12/08/2023, 1:42 PM  Clinical Narrative:  Voicemail left for Amber in admissions at [facility] [phone] requesting return call re possible bed offer. Pt and pt's spouse aware [facility] is able to offer a bed and willing to consider this option if [facility] unable to extend offer. Will provide updates as available.   Frieze Gwenn, MSW, LCSW [phone] (coverage)      Expected Discharge Plan: Skilled Nursing Facility Barriers to Discharge: Continued Medical Work up, SNF Pending bed offer  Expected Discharge Plan and Services In-house Referral: Clinical Social Work Discharge Planning Services: CM Consult Post Acute Care Choice: Skilled Nursing Facility Living arrangements for the past 2 months: Single Family Home Expected Discharge Date: 12/07/23                                     Social Determinants of Health (SDOH) Interventions SDOH Screenings   Food Insecurity: No Food Insecurity (12/01/2023)  Housing: Low Risk  (12/01/2023)  Transportation Needs: No Transportation Needs (12/01/2023)  Utilities: Not At Risk (12/01/2023)  Financial Resource Strain: Low Risk  (11/15/2023)   Received from [facility]  Physical Activity: Inactive (11/15/2023)   Received from [facility]  Social Connections: Moderately Isolated (12/01/2023)  Stress: No Stress Concern Present (11/15/2023)   Received from [facility]  Tobacco Use: Low Risk  (12/02/2023)    Readmission Risk Interventions     No data to display

## 2023-12-08 NOTE — TOC Progression Note (Addendum)
 Transition of Care [facility]) - Progression Note    Patient Details  Name: Morgan Reese MRN: [id number] Date of Birth: [birth year]  Transition of Care [facility]) CM/SW Contact  Bridget Cordella Simmonds, LCSW Phone Number: 12/08/2023, 2:09 PM  Clinical Narrative:   Referral has been faxed twice to [facility], they still do not have it.  Message left for Amber in admissions.  Faxed again.   1500: CSW informed that [facility] cannot accept and family will accept offer at [facility].  CSW spoke with [facility] and they have transferred the auth to [facility]  CSW spoke with [facility] and she can receive pt tomorrow.    Expected Discharge Plan: Skilled Nursing Facility Barriers to Discharge: Continued Medical Work up, SNF Pending bed offer  Expected Discharge Plan and Services In-house Referral: Clinical Social Work Discharge Planning Services: CM Consult Post Acute Care Choice: Skilled Nursing Facility Living arrangements for the past 2 months: Single Family Home Expected Discharge Date: 12/07/23                                     Social Determinants of Health (SDOH) Interventions SDOH Screenings   Food Insecurity: No Food Insecurity (12/01/2023)  Housing: Low Risk  (12/01/2023)  Transportation Needs: No Transportation Needs (12/01/2023)  Utilities: Not At Risk (12/01/2023)  Financial Resource Strain: Low Risk  (11/15/2023)   Received from [facility]  Physical Activity: Inactive (11/15/2023)   Received from [facility]  Social Connections: Moderately Isolated (12/01/2023)  Stress: No Stress Concern Present (11/15/2023)   Received from [facility]  Tobacco Use: Low Risk  (12/02/2023)    Readmission Risk Interventions     No data to display

## 2023-12-08 NOTE — Progress Notes (Addendum)
 Occupational Therapy Treatment Patient Details Name: Morgan Reese MRN: [id number] DOB: [birth year] Today's Date: 12/08/2023   History of present illness Pt is a 73 y.o. female who presented 12/01/23 s/p fall in which she sustained a R tibial fx. S/p ORIF R distal tibial fx 7/11. PMH: atypical Parkinson's disease/question of multisystem atrophy, bilateral vocal cord dysfunction s/p tracheostomy, NPH with plans of possible VP shunt in future, and labile blood pressure   OT comments  Patient with incremental progress toward patient focused goals.  Patient continues to need support for stand pivot transfer, Min A at RW level, and closer to Mod A for lower body ADL from a sit to stand level.  Patient is becoming more comfortable with pivotal steps to/from the recliner, and would benefit from continued inpatient follow up therapy, <3 hours/day, to achieve a Mod I level at wheelchair and no assist for transfers.  OT will continue efforts in the acute setting to address deficits.        If plan is discharge home, recommend the following:  Assist for transportation;Assistance with cooking/housework;A lot of help with bathing/dressing/bathroom;A lot of help with walking and/or transfers   Equipment Recommendations  None recommended by OT    Recommendations for Other Services      Precautions / Restrictions Precautions Precautions: Fall Recall of Precautions/Restrictions: Intact Precaution/Restrictions Comments: Insurance underwriter Required Braces or Orthoses: Other Brace Other Brace: L foot drop neoprene brace attached to shoe Restrictions Weight Bearing Restrictions Per Provider Order: Yes RUE Weight Bearing Per Provider Order: Weight bearing as tolerated Other Position/Activity Restrictions: R LE WBAT for transfers only       Mobility Bed Mobility                    Transfers Overall transfer level: Needs assistance Equipment used: Rolling walker (2 wheels) Transfers: Sit to/from  Stand Sit to Stand: Min assist                 Balance Overall balance assessment: Needs assistance Sitting-balance support: No upper extremity supported, Feet supported Sitting balance-Leahy Scale: Fair     Standing balance support: Reliant on assistive device for balance Standing balance-Leahy Scale: Poor                             ADL either performed or assessed with clinical judgement   ADL                       Lower Body Dressing: Moderate assistance;Sitting/lateral leans;Sit to/from stand   Toilet Transfer: Moderate assistance;Stand-pivot;Rolling walker (2 wheels);BSC/3in1;Minimal assistance Toilet Transfer Details (indicate cue type and reason): Mod A to stand, Min A for stepping to [facility] with minor assist for balance.  Patient continues to be shakey and scared to fall.                Extremity/Trunk Assessment Upper Extremity Assessment Upper Extremity Assessment: Overall WFL for tasks assessed;Right hand dominant   Lower Extremity Assessment Lower Extremity Assessment: Defer to PT evaluation   Cervical / Trunk Assessment Cervical / Trunk Assessment: Normal    Vision Patient Visual Report: No change from baseline Vision Assessment?: No apparent visual deficits   Perception Perception Perception: Not tested   Praxis Praxis Praxis: Not tested   Communication Communication Communication: Impaired Factors Affecting Communication: Trach/intubated   Cognition Arousal: Alert Behavior During Therapy: WFL for tasks assessed/performed Cognition: No apparent impairments  Following commands: Intact        Cueing      Exercises      Shoulder Instructions       General Comments      Pertinent Vitals/ Pain       Pain Assessment Pain Assessment: Faces Faces Pain Scale: Hurts little more Pain Location: R foot Pain Descriptors / Indicators: Throbbing Pain Intervention(s):  Monitored during session                                                          Frequency  Min 2X/week        Progress Toward Goals  OT Goals(current goals can now be found in the care plan section)  Progress towards OT goals: Progressing toward goals  Acute Rehab OT Goals OT Goal Formulation: With patient Time For Goal Achievement: 12/16/23 Potential to Achieve Goals: Good  Plan      Co-evaluation                 AM-PAC OT 6 Clicks Daily Activity     Outcome Measure   Help from another person eating meals?: None Help from another person taking care of personal grooming?: A Little Help from another person toileting, which includes using toliet, bedpan, or urinal?: A Lot Help from another person bathing (including washing, rinsing, drying)?: A Lot Help from another person to put on and taking off regular upper body clothing?: A Little Help from another person to put on and taking off regular lower body clothing?: A Lot 6 Click Score: 16    End of Session Equipment Utilized During Treatment: Gait belt;Rolling walker (2 wheels)  OT Visit Diagnosis: Unsteadiness on feet (R26.81);Muscle weakness (generalized) (M62.81);History of falling (Z91.81);Pain Pain - Right/Left: Right Pain - part of body: Leg   Activity Tolerance Patient tolerated treatment well   Patient Left in chair;with call bell/phone within reach;with chair alarm set   Nurse Communication Mobility status        Time: 8694-8672 OT Time Calculation (min): 22 min  Charges: OT General Charges $OT Visit: 1 Visit OT Treatments $Self Care/Home Management : 8-22 mins  12/08/2023  RP, OTR/L  Acute Rehabilitation Services  Office:  [phone]   Morgan Reese 12/08/2023, 1:33 PM

## 2023-12-08 NOTE — Progress Notes (Signed)
 [facility] PROGRESS NOTE    Progress Note  Morgan Reese  FMW:[id number] DOB: [birth year] DOA: 12/01/2023 PCP: Aisha Harvey, MD     Brief Narrative:   Morgan Reese is an 73 y.o. female past medical history significant for atypical Parkinson's disease, bilateral vocal cord dysfunction status post tracheostomy, normal pressure hydrocephalus with plans for VP shunt in the future labile blood pressure comes in with a right tibial fracture  Assessment/Plan:   Right Fracture of tibial shaft, closed CT showed no displaced oblique spiral fracture of the distal tibial and minimally displaced fibular metaphysis orthopedic surgery was consulted she is status post ORIF on 12/02/2023. Currently on narcotics for pain control and bowel regimen. Orthopedic surgery recommended weightbearing only for transfers. Aspirin  for DVT prophylaxis for 4 weeks as an outpatient. Follow-up with orthopedic surgery in 2 weeks. PT evaluated the patient recommended skilled nursing facility.  Due to her trach she is difficult to place. Patient is medically stable to be transferred to skilled nursing slightly.  Parkinson plus syndrome: Continue Sinemet  and pramipexole .  [name] UTI: She completed course of antibiotics in house.  Anisocoria: Noted.  Vocal cord dysfunction status post tracheostomy: Noted.  Labile blood pressure: Blood pressure appears to be stable she does not have a diagnosis of hypertension. Avoid aggressive blood pressure management in the short-term.  Hypothyroidism: Continue Synthroid .  Constipation: Continue MiraLAX .   DVT prophylaxis: aspirin  Family Communication:husband Status is: Inpatient Remains inpatient appropriate because: Right tibial fracture    Code Status:     Code Status Orders  (From admission, onward)           Start     Ordered   12/01/23 1626  Full code  Continuous       Question:  By:  Answer:  Consent: discussion documented in EHR    12/01/23 1634           Code Status History     Date Active Date Inactive Code Status Order ID Comments User Context   03/10/2021 1552 03/12/2021 1646 Full Code [id number]  Rockey Denece LABOR, DO Inpatient      Advance Directive Documentation    Flowsheet Row Most Recent Value  Type of Advance Directive Living will, Healthcare Power of Attorney  Pre-existing out of facility DNR order (yellow form or pink MOST form) --  MOST Form in Place? --      IV Access:   Peripheral IV   Procedures and diagnostic studies:   No results found.   Medical Consultants:   None.   Subjective:    Morgan Reese she relays no complaint has been having regular bowel movements.  No melanotic stools.  Objective:    Vitals:   12/07/23 1554 12/07/23 1954 12/07/23 2001 12/08/23 0426  BP: (!) 146/75  (!) 95/56 121/65  Pulse: 100  94 82  Resp: 16  17 16   Temp: 97.6 F (36.4 C)  98.4 F (36.9 C) 99.3 F (37.4 C)  TempSrc:   Oral Oral  SpO2: 99% 99% 98% 96%  Weight:      Height:       SpO2: 96 % O2 Flow Rate (L/min): 6 L/min FiO2 (%): 21 %   Intake/Output Summary (Last 24 hours) at 12/08/2023 0726 Last data filed at 12/08/2023 0300 Gross per 24 hour  Intake 120 ml  Output 600 ml  Net -480 ml   Filed Weights   12/01/23 1800 12/02/23 0812  Weight: 57.5 kg 54.4 kg  Exam: General exam: In no acute distress. Respiratory system: Good air movement and clear to auscultation.Trachea in place Cardiovascular system: S1 & S2 heard, RRR. No JVD. Gastrointestinal system: Abdomen is nondistended, soft and nontender.  Extremities: No pedal edema. Skin: No rashes, lesions or ulcers Psychiatry: Judgement and insight appear normal. Mood & affect appropriate.    Data Reviewed:    Labs: Basic Metabolic Panel: Recent Labs  Lab 12/01/23 1727 12/03/23 0707 12/04/23 0818 12/07/23 0700  NA 136 139 135 137  K 3.8 3.3* 4.0 3.8  CL 104 107 105 103  CO2 22 23 22 24    GLUCOSE 82 100* 84 94  BUN 18 12 13 11   CREATININE 0.49 0.56 0.49 0.51  CALCIUM 8.8* 8.7* 8.7* 8.6*  MG  --   --  1.9  --   PHOS  --   --  3.6  --    GFR Estimated Creatinine Clearance: 51 mL/min (by C-G formula based on SCr of 0.51 mg/dL). Liver Function Tests: Recent Labs  Lab 12/04/23 0818  AST 19  ALT 10  ALKPHOS 50  BILITOT 0.8  PROT 5.3*  ALBUMIN 2.8*   No results for input(s): LIPASE, AMYLASE in the last 168 hours. No results for input(s): AMMONIA in the last 168 hours. Coagulation profile Recent Labs  Lab 12/01/23 1727  INR 1.1   COVID-19 Labs  No results for input(s): DDIMER, FERRITIN, LDH, CRP in the last 72 hours.  Lab Results  Component Value Date   SARSCOV2NAA NEGATIVE 01/17/2022   SARSCOV2NAA NEGATIVE 06/13/2021   SARSCOV2NAA NEGATIVE 03/10/2021   SARSCOV2NAA NEGATIVE 03/01/2021    CBC: Recent Labs  Lab 12/01/23 1727 12/03/23 0707 12/04/23 0818 12/07/23 0700  WBC 4.1 6.7 4.4 4.0  NEUTROABS  --   --  2.6  --   HGB 11.6* 11.4* 10.4* 10.5*  HCT 35.2* 33.5* 31.1* 31.5*  MCV 93.4 90.8 93.1 91.3  PLT 142* 178 171 191   Cardiac Enzymes: No results for input(s): CKTOTAL, CKMB, CKMBINDEX, TROPONINI in the last 168 hours. BNP (last 3 results) No results for input(s): PROBNP in the last 8760 hours. CBG: No results for input(s): GLUCAP in the last 168 hours. D-Dimer: No results for input(s): DDIMER in the last 72 hours. Hgb A1c: No results for input(s): HGBA1C in the last 72 hours. Lipid Profile: No results for input(s): CHOL, HDL, LDLCALC, TRIG, CHOLHDL, LDLDIRECT in the last 72 hours. Thyroid  function studies: No results for input(s): TSH, T4TOTAL, T3FREE, THYROIDAB in the last 72 hours.  Invalid input(s): FREET3 Anemia work up: No results for input(s): VITAMINB12, FOLATE, FERRITIN, TIBC, IRON, RETICCTPCT in the last 72 hours. Sepsis Labs: Recent Labs  Lab 12/01/23 1727  12/03/23 0707 12/04/23 0818 12/07/23 0700  WBC 4.1 6.7 4.4 4.0   Microbiology Recent Results (from the past 240 hours)  Surgical PCR screen     Status: None   Collection Time: 12/01/23 10:05 PM   Specimen: Nasal Mucosa; Nasal Swab  Result Value Ref Range Status   MRSA, PCR NEGATIVE NEGATIVE Final   Staphylococcus aureus NEGATIVE NEGATIVE Final    Comment: (NOTE) The Xpert SA Assay (FDA approved for NASAL specimens in patients 23 years of age and older), is one component of a comprehensive surveillance program. It is not intended to diagnose infection nor to guide or monitor treatment. Performed at [facility], [address] [place], [place] [address]      Medications:    acetaminophen   1,000 mg Oral Q8H  docusate sodium   100 mg Oral BID   enoxaparin  (LOVENOX ) injection  40 mg Subcutaneous Q24H   estradiol   1 patch Transdermal Once per day on Monday Thursday   fluticasone   1 spray Each Nare Daily   gabapentin   300 mg Oral TID   levothyroxine   50 mcg Oral QAC breakfast   polyethylene glycol  17 g Oral TID   pramipexole   1 mg Oral TID   progesterone   200 mg Oral Daily   senna-docusate  1 tablet Oral BID   Continuous Infusions:    LOS: 7 days   Erle Odell Castor  [facility]  12/08/2023, 7:26 AM

## 2023-12-08 NOTE — Plan of Care (Signed)

## 2023-12-09 DIAGNOSIS — E039 Hypothyroidism, unspecified: Secondary | ICD-10-CM | POA: Diagnosis not present

## 2023-12-09 DIAGNOSIS — K5900 Constipation, unspecified: Secondary | ICD-10-CM | POA: Diagnosis not present

## 2023-12-09 DIAGNOSIS — S82101D Unspecified fracture of upper end of right tibia, subsequent encounter for closed fracture with routine healing: Secondary | ICD-10-CM | POA: Diagnosis not present

## 2023-12-09 DIAGNOSIS — J069 Acute upper respiratory infection, unspecified: Secondary | ICD-10-CM | POA: Diagnosis not present

## 2023-12-09 DIAGNOSIS — G903 Multi-system degeneration of the autonomic nervous system: Secondary | ICD-10-CM | POA: Diagnosis not present

## 2023-12-09 DIAGNOSIS — Z8719 Personal history of other diseases of the digestive system: Secondary | ICD-10-CM | POA: Diagnosis not present

## 2023-12-09 DIAGNOSIS — R195 Other fecal abnormalities: Secondary | ICD-10-CM | POA: Diagnosis not present

## 2023-12-09 DIAGNOSIS — Z930 Tracheostomy status: Secondary | ICD-10-CM | POA: Diagnosis not present

## 2023-12-09 DIAGNOSIS — E871 Hypo-osmolality and hyponatremia: Secondary | ICD-10-CM | POA: Diagnosis not present

## 2023-12-09 DIAGNOSIS — G20C Parkinsonism, unspecified: Secondary | ICD-10-CM | POA: Diagnosis not present

## 2023-12-09 DIAGNOSIS — K9041 Non-celiac gluten sensitivity: Secondary | ICD-10-CM | POA: Diagnosis not present

## 2023-12-09 DIAGNOSIS — Z7409 Other reduced mobility: Secondary | ICD-10-CM | POA: Diagnosis not present

## 2023-12-09 DIAGNOSIS — J310 Chronic rhinitis: Secondary | ICD-10-CM | POA: Diagnosis not present

## 2023-12-09 DIAGNOSIS — J385 Laryngeal spasm: Secondary | ICD-10-CM | POA: Diagnosis not present

## 2023-12-09 DIAGNOSIS — G912 (Idiopathic) normal pressure hydrocephalus: Secondary | ICD-10-CM | POA: Diagnosis not present

## 2023-12-09 DIAGNOSIS — Z79890 Hormone replacement therapy (postmenopausal): Secondary | ICD-10-CM | POA: Diagnosis not present

## 2023-12-09 DIAGNOSIS — S82241A Displaced spiral fracture of shaft of right tibia, initial encounter for closed fracture: Secondary | ICD-10-CM | POA: Diagnosis not present

## 2023-12-09 DIAGNOSIS — D649 Anemia, unspecified: Secondary | ICD-10-CM | POA: Diagnosis not present

## 2023-12-09 DIAGNOSIS — R531 Weakness: Secondary | ICD-10-CM | POA: Diagnosis not present

## 2023-12-09 DIAGNOSIS — N390 Urinary tract infection, site not specified: Secondary | ICD-10-CM | POA: Diagnosis not present

## 2023-12-09 DIAGNOSIS — R52 Pain, unspecified: Secondary | ICD-10-CM | POA: Diagnosis not present

## 2023-12-09 DIAGNOSIS — M6281 Muscle weakness (generalized): Secondary | ICD-10-CM | POA: Diagnosis not present

## 2023-12-09 DIAGNOSIS — Z4789 Encounter for other orthopedic aftercare: Secondary | ICD-10-CM | POA: Diagnosis not present

## 2023-12-09 DIAGNOSIS — G20B2 Parkinson's disease with dyskinesia, with fluctuations: Secondary | ICD-10-CM | POA: Diagnosis not present

## 2023-12-09 DIAGNOSIS — S82101A Unspecified fracture of upper end of right tibia, initial encounter for closed fracture: Secondary | ICD-10-CM | POA: Diagnosis not present

## 2023-12-09 DIAGNOSIS — R471 Dysarthria and anarthria: Secondary | ICD-10-CM | POA: Diagnosis not present

## 2023-12-09 DIAGNOSIS — Z7401 Bed confinement status: Secondary | ICD-10-CM | POA: Diagnosis not present

## 2023-12-09 DIAGNOSIS — E559 Vitamin D deficiency, unspecified: Secondary | ICD-10-CM | POA: Diagnosis not present

## 2023-12-09 DIAGNOSIS — S82401D Unspecified fracture of shaft of right fibula, subsequent encounter for closed fracture with routine healing: Secondary | ICD-10-CM | POA: Diagnosis not present

## 2023-12-09 DIAGNOSIS — J3800 Paralysis of vocal cords and larynx, unspecified: Secondary | ICD-10-CM | POA: Diagnosis not present

## 2023-12-09 DIAGNOSIS — S82201D Unspecified fracture of shaft of right tibia, subsequent encounter for closed fracture with routine healing: Secondary | ICD-10-CM | POA: Diagnosis not present

## 2023-12-09 NOTE — Discharge Summary (Signed)
 Physician Discharge Summary  TEAGEN BUCIO FMW:[id number] DOB: 10-01-1950 DOA: 12/01/2023  PCP: Aisha Harvey, MD  Admit date: 12/01/2023 Discharge date: 12/09/2023  Time spent: 40 minutes  Recommendations for Outpatient Follow-up:  Follow outpatient CBC/CMP  Follow with orthopedics in 1-2 weeks Follow with her outpatient neurologist for her chronic medical conditions   Discharge Diagnoses:  Principal Problem:   Fracture of tibial shaft, closed Active Problems:   Closed fibular fracture   Fall   Parkinson's plus syndrome (HCC)   Vocal cord dysfunction   Status post tracheostomy (HCC)   Labile blood pressure   Hypothyroidism   Discharge Condition: stable  Diet recommendation: heart healthy  Filed Weights   12/01/23 1800 12/02/23 0812  Weight: 57.5 kg 54.4 kg    History of present illness:   Morgan Reese is a 73 y.o. female with medical history significant of atypical Parkinson's disease/question of multisystem atrophy, bilateral vocal cord dysfunction s/p tracheostomy, NPH with plans of possible VP shunt in future, and labile blood pressure who sustained a right tibial fracture presents with a right leg fracture following a fall. She is accompanied by her husband and friend.   She has a history of atypical Parkinson's disease, referred to as 'Parkinson's Plus', and is experiencing tremors. She is sensitive to carbidopa -levodopa  and is currently taking pramipexole  0.5 mg, two tablets three times a day. She also has a history of vocal cord paralysis, which affects her ability to speak, and is awaiting a procedure to address this issue.   She has a history of hydrocephalus and has previously undergone a ventriculostomy. There is a consideration for a shunt placement in the future.   Hospital Course:  Assessment and Plan:  Right leg tibial and proximal fibular fracture secondary to fall Patient suffered a fall fracturing her right tibia while trying to get into their  camper while in [place]  2 days ago.  Returned home and was seen at orthopedics office on the day of admission.   Direct admission was requested with plans for surgery in a.m. - CT with oblique nondisplaced spiral fracture of the distal tibial metadiaphysis, minimal displaced fracture of the proximal fibular metaphysis, punctate lineal cortical fragments and relatively well corticated ossicle adjacent to the tip of the lateral malleolus at the level of the anterolateral joint space may refect sequela of prior ligamentous injury, acute cortical avulsion can't be excluded - now s/p ORIF right distal tibia fracture 7/11 - scheduled APAP, oxy prn, morphine  - miralax  - ortho recommending WB for transfers only on RLE, aspirin  for DVT ppx x4 weeks outpatient.  Follow up in 1-2 weeks with Dr. [name].   Parkinson's plus syndrome Patient is followed by neurology at [facility].  Reports sensitivity to Sinemet . - Continue pramipexole     [name] UTI - sensitives from OSH pending - sensitive to ceftriaxone , FQ, nitrofurantoin (per my phone call discussion with micro).  Will d/c and complete abx for simple UTI with 3 days ceftriaxone .  - secretary to request records   Anisocoria L pupil larger than right, [name] notes that this can be seen in parkinson's.  Looks like this might be the most likely explanation .  She reports head CT at OSH was unremarkable (I don't have this report).     Vocal cord dysfunction S/p tracheostomy   Labile blood pressure Patient has labile blood pressures - Avoid any aggressive blood pressure regulation as patient has significant drops.   Hypothyroidism - Continue levothyroxine    HRT -  uses estradiol  patch/vaginal estrogen + progesterone  pill    Constipation aggressive regimen    Procedures: Open reduction internal fixation of right distal tibia fracture    Consultations: orthopedics  Discharge Exam: Vitals:   12/09/23 0426 12/09/23 0735  BP: 125/73  131/69  Pulse: 76 82  Resp: 14 16  Temp: 98.4 F (36.9 C) 98 F (36.7 C)  SpO2: 99% 95%   No complaints Friend, husband at bedside  General: No acute distress. Cardiovascular: RRR Lungs: unlabored Neurological: Alert and oriented 3. Moves all extremities 4 with equal strength. Cranial nerves II through XII grossly intact. Extremities: RLE splint  Discharge Instructions   Discharge Instructions     Call MD for:  difficulty breathing, headache or visual disturbances   Complete by: As directed    Call MD for:  extreme fatigue   Complete by: As directed    Call MD for:  hives   Complete by: As directed    Call MD for:  persistant dizziness or light-headedness   Complete by: As directed    Call MD for:  persistant nausea and vomiting   Complete by: As directed    Call MD for:  redness, tenderness, or signs of infection (pain, swelling, redness, odor or green/yellow discharge around incision site)   Complete by: As directed    Call MD for:  severe uncontrolled pain   Complete by: As directed    Call MD for:  temperature >100.4   Complete by: As directed    Diet - low sodium heart healthy   Complete by: As directed    Diet - low sodium heart healthy   Complete by: As directed    Diet - low sodium heart healthy   Complete by: As directed    Discharge instructions   Complete by: As directed    You were seen after a right leg tibial and fibular fracture.    This was surgically repaired by orthopedics.  Continue tylenol  as needed for pain.  Use oxycodone  5-10 mg every 6 hrs as needed for pain not controlled by tylenol .  At discharge, you'll continue aspirin  to help prevent blood clots after surgery.  You'll need to arrange follow up with Dr. [name] in 1-2 weeks.  You can weight bear only for transfers to the right lower extremity.   Return for new, recurrent, or worsening symptoms.  Please ask your PCP to request records from this hospitalization so they know what was  done and what the next steps will be.   Discharge wound care:   Complete by: As directed    Per orthopedics   Increase activity slowly   Complete by: As directed    Increase activity slowly   Complete by: As directed    Increase activity slowly   Complete by: As directed    No wound care   Complete by: As directed    No wound care   Complete by: As directed       Allergies as of 12/09/2023       Reactions   Amoxicillin Swelling, Other (See Comments)   REACTION: Head felt over-heated Other reaction(s): Other (See Comments) REACTION: Head felt over-heated    REACTION: Head felt over-heated Other reaction(s): Other (See Comments) REACTION: Head felt over-heated   Gluten Meal Other (See Comments)   Latex Other (See Comments)   Skin irritation   Serotonin Other (See Comments)   Other reaction(s): hyponatremia SSRI's are contraindicated, causes hyponatremia    SSRI's are  contraindicated, causes hyponatremia  Other reaction(s): hyponatremia SSRI's are contraindicated, causes hyponatremia Other reaction(s): hyponatremia SSRI's are contraindicated, causes hyponatremia  SSRI's are contraindicated, causes hyponatremia  Other reaction(s): hyponatremia SSRI's are contraindicated, causes hyponatremia   Serotonin Reuptake Inhibitors (ssris) Other (See Comments)   Other reaction(s): hyponatremia SSRI's are contraindicated, causes hyponatremia        Medication List     STOP taking these medications    oxycodone  5 MG capsule Commonly known as: OXY-IR Replaced by: oxyCODONE  5 MG immediate release tablet       TAKE these medications    acetaminophen  500 MG tablet Commonly known as: TYLENOL  Take 2 tablets (1,000 mg total) by mouth every 8 (eight) hours as needed.   aspirin  EC 325 MG tablet Take 1 tablet (325 mg total) by mouth daily.   COPPER PO Take 30 mg by mouth 2 (two) times a week.   cyclobenzaprine  10 MG tablet Commonly known as: FLEXERIL  Take 10 mg by mouth 3  (three) times daily as needed.   estradiol  0.0375 MG/24HR Commonly known as: VIVELLE -DOT Place 1 patch onto the skin 2 (two) times a week.   estradiol  0.1 MG/GM vaginal cream Commonly known as: ESTRACE  Place 1 Applicatorful vaginally at bedtime.   Fish Oil 1000 MG Caps Take 1 capsule by mouth at bedtime.   gabapentin  300 MG capsule Commonly known as: NEURONTIN  Take 1 capsule (300 mg total) by mouth 3 (three) times daily.   levothyroxine  50 MCG tablet Commonly known as: SYNTHROID  Take 50 mcg by mouth daily before breakfast.   Magnesium Citrate 200 MG Tabs Take 600 mg by mouth daily.   melatonin 3 MG Tabs tablet Take 6-9 mg by mouth at bedtime.   oxyCODONE  5 MG immediate release tablet Commonly known as: Oxy IR/ROXICODONE  Take 1-2 tablets (5-10 mg total) by mouth every 6 (six) hours as needed for up to 3 days (for pain not controlled by tylenol ). Replaces: oxycodone  5 MG capsule   phenazopyridine 200 MG tablet Commonly known as: PYRIDIUM Take 200 mg by mouth 3 (three) times daily as needed (burning on urination).   polyethylene glycol 17 g packet Commonly known as: MIRALAX  / GLYCOLAX  Take 17 g by mouth 2 (two) times daily.   polyethylene glycol 17 g packet Commonly known as: MIRALAX  / GLYCOLAX  Take 17 g by mouth daily as needed for mild constipation.   pramipexole  1 MG tablet Commonly known as: MIRAPEX  Take 1 tablet (1 mg total) by mouth 3 (three) times daily.   progesterone  100 MG capsule Commonly known as: PROMETRIUM  Take 200 mg by mouth at bedtime.   pyridOXINE  50 MG tablet Commonly known as: B-6 Take 50 mg by mouth daily.   thiamine 100 MG tablet Commonly known as: VITAMIN B1 Take 100 mg by mouth daily.   vitamin C 1000 MG tablet Take 1,000 mg by mouth daily.   VITAMIN D3 PO Take by mouth. Alternates 10,000 units with 5,000 units each day.   VITAMIN K PO Take by mouth daily. MK7   zinc  gluconate 50 MG tablet Take 50 mg by mouth daily.                Discharge Care Instructions  (From admission, onward)           Start     Ordered   12/06/23 0000  Discharge wound care:       Comments: Per orthopedics   12/06/23 1700  Allergies  Allergen Reactions   Amoxicillin Swelling and Other (See Comments)    REACTION: Head felt over-heated  Other reaction(s): Other (See Comments)  REACTION: Head felt over-heated    REACTION: Head felt over-heated Other reaction(s): Other (See Comments) REACTION: Head felt over-heated   Gluten Meal Other (See Comments)   Latex Other (See Comments)    Skin irritation   Serotonin Other (See Comments)    Other reaction(s): hyponatremia SSRI's are contraindicated, causes hyponatremia    SSRI's are contraindicated, causes hyponatremia  Other reaction(s): hyponatremia SSRI's are contraindicated, causes hyponatremia  Other reaction(s): hyponatremia SSRI's are contraindicated, causes hyponatremia  SSRI's are contraindicated, causes hyponatremia  Other reaction(s): hyponatremia SSRI's are contraindicated, causes hyponatremia   Serotonin Reuptake Inhibitors (Ssris) Other (See Comments)    Other reaction(s): hyponatremia SSRI's are contraindicated, causes hyponatremia     Contact information for follow-up providers     Haddix, Franky SQUIBB, MD. Schedule an appointment as soon as possible for a visit in 2 week(s).   Specialty: Orthopedic Surgery Why: for wound check, splint/suture rmeoval, and repeat x-rays Contact information: [address] [place] [address] [phone]         Aisha Harvey, MD Follow up.   Specialty: Family Medicine Contact information: [address] [place] [address] [phone]              Contact information for after-discharge care     Destination     [facility] .   Service: Skilled Nursing Contact information: [address] [place]  [address] [phone]                       The results of significant diagnostics from this hospitalization (including imaging, microbiology, ancillary and laboratory) are listed below for reference.    Significant Diagnostic Studies: DG Tibia/Fibula Right Port Result Date: 12/02/2023 CLINICAL DATA:  Fracture, postop. EXAM: PORTABLE RIGHT TIBIA AND FIBULA - 2 VIEW COMPARISON:  Preoperative CT FINDINGS: Medial plate and screw fixation of distal tibial shaft fracture. Fracture is in near anatomic alignment Known proximal fibular fracture is only faintly visualized on the current exam. Splint material is seen distally. There is generalized soft tissue edema. IMPRESSION: ORIF of distal tibial shaft fracture. Known proximal fibular fracture is only faintly visualized on the current exam. Electronically Signed   By: Andrea Gasman M.D.   On: 12/02/2023 11:38   DG Tibia/Fibula Right Result Date: 12/02/2023 CLINICAL DATA:  Elective surgery. EXAM: RIGHT TIBIA AND FIBULA - 2 VIEW COMPARISON:  Preoperative CT FINDINGS: Six fluoroscopic spot views of the tibia and fibula submitted from the operating room. Medial plate and screw fixation of distal tibial fracture. Fluoroscopy time 1 minutes 7 seconds. Dose 1.57 mGy. IMPRESSION: Intraoperative fluoroscopy during distal tibial fracture fixation. Electronically Signed   By: Andrea Gasman M.D.   On: 12/02/2023 11:36   DG C-Arm 1-60 Min-No Report Result Date: 12/02/2023 Fluoroscopy was utilized by the requesting physician.  No radiographic interpretation.   CT TIBIA FIBULA RIGHT WO CONTRAST Result Date: 12/02/2023 CLINICAL DATA:  Fracture, tib/fib preop planning EXAM: CT OF THE LOWER RIGHT EXTREMITY WITHOUT CONTRAST TECHNIQUE: Multidetector CT imaging of the right lower extremity was performed according to the standard protocol. RADIATION DOSE REDUCTION: This exam was performed according to the departmental dose-optimization program which includes automated exposure control, adjustment of the mA  and/or kV according to patient size and/or use of iterative reconstruction technique. COMPARISON:  None Available. FINDINGS: Bones/Joint/Cartilage Oblique  nondisplaced spiral fracture of the distal tibial metadiaphysis. Proximal fracture margin at the medial tibial diaphysis and distal fracture margin at the anteromedial tibial metaphysis. Minimally displaced fracture of the proximal fibular metaphysis at the level of the fibular neck (series 3, image 39 and series 7, images 30-32). Punctate linear cortical fragments and relatively well corticated ossicle adjacent to the tip of the lateral malleolus at the level of the anterolateral joint space (series 3, images 200 and 201) may reflect sequela of prior ligamentous injury, however, acute cortical avulsion cannot be excluded. No additional fracture identified. Knee is anatomically aligned with mild osteoarthritis. Ankle mortise is congruent. Ligaments Ligaments are suboptimally evaluated by CT. Muscles and Tendons Mild fatty atrophy of the gastrocnemius and soleus muscles. Visualized tendons are intact. No intramuscular fluid collection. Soft tissue Subcutaneous edema extending through the distal shin and calf and hindfoot. No loculated fluid collection. IMPRESSION: 1. Oblique nondisplaced spiral fracture of the distal tibial metadiaphysis. 2. Minimally displaced fracture of the proximal fibular metaphysis. 3. Punctate linear cortical fragments and relatively well corticated ossicle adjacent to the tip of the lateral malleolus at the level of the anterolateral joint space may reflect sequela of prior ligamentous injury, however, acute cortical avulsion cannot be excluded. No additional fracture identified. Electronically Signed   By: Harrietta Sherry M.D.   On: 12/02/2023 08:34    Microbiology: Recent Results (from the past 240 hours)  Surgical PCR screen     Status: None   Collection Time: 12/01/23 10:05 PM   Specimen: Nasal Mucosa; Nasal Swab  Result Value  Ref Range Status   MRSA, PCR NEGATIVE NEGATIVE Final   Staphylococcus aureus NEGATIVE NEGATIVE Final    Comment: (NOTE) The Xpert SA Assay (FDA approved for NASAL specimens in patients 74 years of age and older), is one component of a comprehensive surveillance program. It is not intended to diagnose infection nor to guide or monitor treatment. Performed at [facility], [address] [place], [place] [address]      Labs: Basic Metabolic Panel: Recent Labs  Lab 12/03/23 0707 12/04/23 0818 12/07/23 0700  NA 139 135 137  K 3.3* 4.0 3.8  CL 107 105 103  CO2 23 22 24   GLUCOSE 100* 84 94  BUN 12 13 11   CREATININE 0.56 0.49 0.51  CALCIUM 8.7* 8.7* 8.6*  MG  --  1.9  --   PHOS  --  3.6  --    Liver Function Tests: Recent Labs  Lab 12/04/23 0818  AST 19  ALT 10  ALKPHOS 50  BILITOT 0.8  PROT 5.3*  ALBUMIN 2.8*   No results for input(s): LIPASE, AMYLASE in the last 168 hours. No results for input(s): AMMONIA in the last 168 hours. CBC: Recent Labs  Lab 12/03/23 0707 12/04/23 0818 12/07/23 0700  WBC 6.7 4.4 4.0  NEUTROABS  --  2.6  --   HGB 11.4* 10.4* 10.5*  HCT 33.5* 31.1* 31.5*  MCV 90.8 93.1 91.3  PLT 178 171 191   Cardiac Enzymes: No results for input(s): CKTOTAL, CKMB, CKMBINDEX, TROPONINI in the last 168 hours. BNP: BNP (last 3 results) No results for input(s): BNP in the last 8760 hours.  ProBNP (last 3 results) No results for input(s): PROBNP in the last 8760 hours.  CBG: No results for input(s): GLUCAP in the last 168 hours.     Signed:  Erle Odell Castor MD.  [facility] 12/09/2023, 7:41 AM

## 2023-12-09 NOTE — TOC Transition Note (Signed)
 Transition of Care [facility]) - Discharge Note   Patient Details  Name: Morgan Reese MRN: [id number] Date of Birth: 1951/01/17  Transition of Care [facility]) CM/SW Contact:  Gwenn Julien [place], [place] Phone Number: 12/09/2023, 12:06 PM   Clinical Narrative:  Pt for dc to [facility] today. Spoke to Kia in admissions who confirmed they are prepared to admit pt to room 103P. Pt and pt's spouse aware of dc and report agreeable. RN provided with number for report and PTAR arranged for transport. SW signing off at dc.    Julien Gwenn, MSW, LCSW [phone] (coverage)       Final next level of care: Skilled Nursing Facility Barriers to Discharge: Barriers Resolved   Patient Goals and CMS Choice Patient states their goals for this hospitalization and ongoing recovery are:: More independence CMS Medicare.gov Compare Post Acute Care list provided to:: Patient Choice offered to / list presented to : Patient, Spouse      Discharge Placement              Patient chooses bed at: [facility] Patient to be transferred to facility by: PTAR Name of family member notified: William/son Patient and family notified of of transfer: 12/09/23  Discharge Plan and Services Additional resources added to the After Visit Summary for   In-house Referral: Clinical Social Work Discharge Planning Services: CM Consult Post Acute Care Choice: Skilled Nursing Facility                               Social Drivers of Health (SDOH) Interventions SDOH Screenings   Food Insecurity: No Food Insecurity (12/01/2023)  Housing: Low Risk  (12/01/2023)  Transportation Needs: No Transportation Needs (12/01/2023)  Utilities: Not At Risk (12/01/2023)  Financial Resource Strain: Low Risk  (11/15/2023)   Received from [facility]  Physical Activity: Inactive (11/15/2023)   Received from [facility]  Social Connections: Moderately Isolated (12/01/2023)  Stress: No Stress Concern Present  (11/15/2023)   Received from [facility]  Tobacco Use: Low Risk  (12/02/2023)     Readmission Risk Interventions     No data to display

## 2023-12-09 NOTE — Progress Notes (Signed)
 Called report to Artois at [place].

## 2023-12-09 NOTE — Plan of Care (Signed)
  Problem: Clinical Measurements: Goal: Ability to maintain clinical measurements within normal limits will improve Outcome: Progressing Goal: Will remain free from infection Outcome: Progressing Goal: Diagnostic test results will improve Outcome: Progressing Goal: Respiratory complications will improve Outcome: Progressing Goal: Cardiovascular complication will be avoided Outcome: Progressing   Problem: Activity: Goal: Risk for activity intolerance will decrease Outcome: Progressing   Problem: Pain Managment: Goal: General experience of comfort will improve and/or be controlled Outcome: Progressing   Problem: Safety: Goal: Ability to remain free from injury will improve Outcome: Progressing

## 2023-12-09 NOTE — Progress Notes (Signed)
 Patient left with EMS to [place].  Husband at bedside and aware of her discharge.  Packet and all belongings sent with patient.   Printed prescriptions sent in packet.

## 2023-12-09 NOTE — Progress Notes (Signed)
 Mobility Specialist Progress Note:    12/09/23 1004  Mobility  Activity Transferred from bed to chair  Level of Assistance Contact guard assist, steadying assist  Assistive Device Front wheel walker  Distance Ambulated (ft) 3 ft  RUE Weight Bearing Per Provider Order WBAT  Activity Response Tolerated well  Mobility Referral Yes  Mobility visit 1 Mobility  Mobility Specialist Start Time (ACUTE ONLY) 0913  Mobility Specialist Stop Time (ACUTE ONLY) [id number]  Mobility Specialist Time Calculation (min) (ACUTE ONLY) 9 min   Received pt in bed and agreeable to transfer to chair. Pt required no physical assistance. Pt left in chair with alarm on. Personal belongings and call light within reach. All needs met.   Lavanda Pollack Mobility Specialist  Please contact via [employer] or  Rehab Office [phone]

## 2023-12-10 DIAGNOSIS — K5900 Constipation, unspecified: Secondary | ICD-10-CM | POA: Diagnosis not present

## 2023-12-10 DIAGNOSIS — G903 Multi-system degeneration of the autonomic nervous system: Secondary | ICD-10-CM | POA: Diagnosis not present

## 2023-12-10 DIAGNOSIS — Z930 Tracheostomy status: Secondary | ICD-10-CM | POA: Diagnosis not present

## 2023-12-10 DIAGNOSIS — Z79890 Hormone replacement therapy (postmenopausal): Secondary | ICD-10-CM | POA: Diagnosis not present

## 2023-12-10 DIAGNOSIS — J385 Laryngeal spasm: Secondary | ICD-10-CM | POA: Diagnosis not present

## 2023-12-10 DIAGNOSIS — G20B2 Parkinson's disease with dyskinesia, with fluctuations: Secondary | ICD-10-CM | POA: Diagnosis not present

## 2023-12-10 DIAGNOSIS — S82101A Unspecified fracture of upper end of right tibia, initial encounter for closed fracture: Secondary | ICD-10-CM | POA: Diagnosis not present

## 2023-12-10 DIAGNOSIS — D649 Anemia, unspecified: Secondary | ICD-10-CM | POA: Diagnosis not present

## 2023-12-10 DIAGNOSIS — E039 Hypothyroidism, unspecified: Secondary | ICD-10-CM | POA: Diagnosis not present

## 2023-12-11 DIAGNOSIS — Z930 Tracheostomy status: Secondary | ICD-10-CM | POA: Diagnosis not present

## 2023-12-11 DIAGNOSIS — Z4789 Encounter for other orthopedic aftercare: Secondary | ICD-10-CM | POA: Diagnosis not present

## 2023-12-11 DIAGNOSIS — Z7409 Other reduced mobility: Secondary | ICD-10-CM | POA: Diagnosis not present

## 2023-12-11 DIAGNOSIS — R531 Weakness: Secondary | ICD-10-CM | POA: Diagnosis not present

## 2023-12-11 DIAGNOSIS — G20B2 Parkinson's disease with dyskinesia, with fluctuations: Secondary | ICD-10-CM | POA: Diagnosis not present

## 2023-12-11 DIAGNOSIS — S82101A Unspecified fracture of upper end of right tibia, initial encounter for closed fracture: Secondary | ICD-10-CM | POA: Diagnosis not present

## 2023-12-11 DIAGNOSIS — K5900 Constipation, unspecified: Secondary | ICD-10-CM | POA: Diagnosis not present

## 2023-12-11 DIAGNOSIS — D649 Anemia, unspecified: Secondary | ICD-10-CM | POA: Diagnosis not present

## 2023-12-12 DIAGNOSIS — Z4789 Encounter for other orthopedic aftercare: Secondary | ICD-10-CM | POA: Diagnosis not present

## 2023-12-12 DIAGNOSIS — R531 Weakness: Secondary | ICD-10-CM | POA: Diagnosis not present

## 2023-12-12 DIAGNOSIS — Z7409 Other reduced mobility: Secondary | ICD-10-CM | POA: Diagnosis not present

## 2023-12-12 DIAGNOSIS — E559 Vitamin D deficiency, unspecified: Secondary | ICD-10-CM | POA: Diagnosis not present

## 2023-12-12 DIAGNOSIS — D649 Anemia, unspecified: Secondary | ICD-10-CM | POA: Diagnosis not present

## 2023-12-12 DIAGNOSIS — S82101A Unspecified fracture of upper end of right tibia, initial encounter for closed fracture: Secondary | ICD-10-CM | POA: Diagnosis not present

## 2023-12-13 DIAGNOSIS — E871 Hypo-osmolality and hyponatremia: Secondary | ICD-10-CM | POA: Diagnosis not present

## 2023-12-13 DIAGNOSIS — R52 Pain, unspecified: Secondary | ICD-10-CM | POA: Diagnosis not present

## 2023-12-13 DIAGNOSIS — R531 Weakness: Secondary | ICD-10-CM | POA: Diagnosis not present

## 2023-12-13 DIAGNOSIS — Z4789 Encounter for other orthopedic aftercare: Secondary | ICD-10-CM | POA: Diagnosis not present

## 2023-12-13 DIAGNOSIS — E559 Vitamin D deficiency, unspecified: Secondary | ICD-10-CM | POA: Diagnosis not present

## 2023-12-13 DIAGNOSIS — S82201D Unspecified fracture of shaft of right tibia, subsequent encounter for closed fracture with routine healing: Secondary | ICD-10-CM | POA: Diagnosis not present

## 2023-12-13 DIAGNOSIS — Z7409 Other reduced mobility: Secondary | ICD-10-CM | POA: Diagnosis not present

## 2023-12-13 DIAGNOSIS — D649 Anemia, unspecified: Secondary | ICD-10-CM | POA: Diagnosis not present

## 2023-12-14 DIAGNOSIS — G20B2 Parkinson's disease with dyskinesia, with fluctuations: Secondary | ICD-10-CM | POA: Diagnosis not present

## 2023-12-14 DIAGNOSIS — R52 Pain, unspecified: Secondary | ICD-10-CM | POA: Diagnosis not present

## 2023-12-14 DIAGNOSIS — S82201D Unspecified fracture of shaft of right tibia, subsequent encounter for closed fracture with routine healing: Secondary | ICD-10-CM | POA: Diagnosis not present

## 2023-12-14 DIAGNOSIS — G912 (Idiopathic) normal pressure hydrocephalus: Secondary | ICD-10-CM | POA: Diagnosis not present

## 2023-12-14 DIAGNOSIS — N390 Urinary tract infection, site not specified: Secondary | ICD-10-CM | POA: Diagnosis not present

## 2023-12-15 DIAGNOSIS — Z930 Tracheostomy status: Secondary | ICD-10-CM | POA: Diagnosis not present

## 2023-12-15 DIAGNOSIS — Z7409 Other reduced mobility: Secondary | ICD-10-CM | POA: Diagnosis not present

## 2023-12-15 DIAGNOSIS — J3800 Paralysis of vocal cords and larynx, unspecified: Secondary | ICD-10-CM | POA: Diagnosis not present

## 2023-12-15 DIAGNOSIS — R531 Weakness: Secondary | ICD-10-CM | POA: Diagnosis not present

## 2023-12-15 DIAGNOSIS — Z4789 Encounter for other orthopedic aftercare: Secondary | ICD-10-CM | POA: Diagnosis not present

## 2023-12-15 DIAGNOSIS — S82201D Unspecified fracture of shaft of right tibia, subsequent encounter for closed fracture with routine healing: Secondary | ICD-10-CM | POA: Diagnosis not present

## 2023-12-15 DIAGNOSIS — S82101D Unspecified fracture of upper end of right tibia, subsequent encounter for closed fracture with routine healing: Secondary | ICD-10-CM | POA: Diagnosis not present

## 2023-12-17 DIAGNOSIS — K9041 Non-celiac gluten sensitivity: Secondary | ICD-10-CM | POA: Diagnosis not present

## 2023-12-17 DIAGNOSIS — Z4789 Encounter for other orthopedic aftercare: Secondary | ICD-10-CM | POA: Diagnosis not present

## 2023-12-17 DIAGNOSIS — R531 Weakness: Secondary | ICD-10-CM | POA: Diagnosis not present

## 2023-12-17 DIAGNOSIS — J310 Chronic rhinitis: Secondary | ICD-10-CM | POA: Diagnosis not present

## 2023-12-17 DIAGNOSIS — E559 Vitamin D deficiency, unspecified: Secondary | ICD-10-CM | POA: Diagnosis not present

## 2023-12-17 DIAGNOSIS — S82201D Unspecified fracture of shaft of right tibia, subsequent encounter for closed fracture with routine healing: Secondary | ICD-10-CM | POA: Diagnosis not present

## 2023-12-17 DIAGNOSIS — J069 Acute upper respiratory infection, unspecified: Secondary | ICD-10-CM | POA: Diagnosis not present

## 2023-12-17 DIAGNOSIS — Z7409 Other reduced mobility: Secondary | ICD-10-CM | POA: Diagnosis not present

## 2023-12-17 DIAGNOSIS — D649 Anemia, unspecified: Secondary | ICD-10-CM | POA: Diagnosis not present

## 2023-12-20 DIAGNOSIS — S82201D Unspecified fracture of shaft of right tibia, subsequent encounter for closed fracture with routine healing: Secondary | ICD-10-CM | POA: Diagnosis not present

## 2023-12-20 DIAGNOSIS — Z8719 Personal history of other diseases of the digestive system: Secondary | ICD-10-CM | POA: Diagnosis not present

## 2023-12-20 DIAGNOSIS — Z7409 Other reduced mobility: Secondary | ICD-10-CM | POA: Diagnosis not present

## 2023-12-20 DIAGNOSIS — R52 Pain, unspecified: Secondary | ICD-10-CM | POA: Diagnosis not present

## 2023-12-20 DIAGNOSIS — Z4789 Encounter for other orthopedic aftercare: Secondary | ICD-10-CM | POA: Diagnosis not present

## 2023-12-20 DIAGNOSIS — R531 Weakness: Secondary | ICD-10-CM | POA: Diagnosis not present

## 2023-12-20 DIAGNOSIS — R195 Other fecal abnormalities: Secondary | ICD-10-CM | POA: Diagnosis not present

## 2023-12-22 DIAGNOSIS — Z7409 Other reduced mobility: Secondary | ICD-10-CM | POA: Diagnosis not present

## 2023-12-22 DIAGNOSIS — R531 Weakness: Secondary | ICD-10-CM | POA: Diagnosis not present

## 2023-12-22 DIAGNOSIS — Z4789 Encounter for other orthopedic aftercare: Secondary | ICD-10-CM | POA: Diagnosis not present

## 2023-12-22 DIAGNOSIS — S82201D Unspecified fracture of shaft of right tibia, subsequent encounter for closed fracture with routine healing: Secondary | ICD-10-CM | POA: Diagnosis not present

## 2023-12-22 DIAGNOSIS — R195 Other fecal abnormalities: Secondary | ICD-10-CM | POA: Diagnosis not present

## 2023-12-25 DIAGNOSIS — S82101D Unspecified fracture of upper end of right tibia, subsequent encounter for closed fracture with routine healing: Secondary | ICD-10-CM | POA: Diagnosis not present

## 2023-12-26 DIAGNOSIS — S82201D Unspecified fracture of shaft of right tibia, subsequent encounter for closed fracture with routine healing: Secondary | ICD-10-CM | POA: Diagnosis not present

## 2023-12-28 DIAGNOSIS — Z930 Tracheostomy status: Secondary | ICD-10-CM | POA: Diagnosis not present

## 2023-12-28 DIAGNOSIS — S82241D Displaced spiral fracture of shaft of right tibia, subsequent encounter for closed fracture with routine healing: Secondary | ICD-10-CM | POA: Diagnosis not present

## 2023-12-28 DIAGNOSIS — D649 Anemia, unspecified: Secondary | ICD-10-CM | POA: Diagnosis not present

## 2023-12-28 DIAGNOSIS — S82441D Displaced spiral fracture of shaft of right fibula, subsequent encounter for closed fracture with routine healing: Secondary | ICD-10-CM | POA: Diagnosis not present

## 2023-12-28 DIAGNOSIS — N39498 Other specified urinary incontinence: Secondary | ICD-10-CM | POA: Diagnosis not present

## 2023-12-28 DIAGNOSIS — G912 (Idiopathic) normal pressure hydrocephalus: Secondary | ICD-10-CM | POA: Diagnosis not present

## 2023-12-28 DIAGNOSIS — M8080XD Other osteoporosis with current pathological fracture, unspecified site, subsequent encounter for fracture with routine healing: Secondary | ICD-10-CM | POA: Diagnosis not present

## 2023-12-28 DIAGNOSIS — Z6821 Body mass index (BMI) 21.0-21.9, adult: Secondary | ICD-10-CM | POA: Diagnosis not present

## 2024-01-02 DIAGNOSIS — M80061D Age-related osteoporosis with current pathological fracture, right lower leg, subsequent encounter for fracture with routine healing: Secondary | ICD-10-CM | POA: Diagnosis not present

## 2024-01-02 DIAGNOSIS — G629 Polyneuropathy, unspecified: Secondary | ICD-10-CM | POA: Diagnosis not present

## 2024-01-02 DIAGNOSIS — G20A1 Parkinson's disease without dyskinesia, without mention of fluctuations: Secondary | ICD-10-CM | POA: Diagnosis not present

## 2024-01-02 DIAGNOSIS — N39498 Other specified urinary incontinence: Secondary | ICD-10-CM | POA: Diagnosis not present

## 2024-01-02 DIAGNOSIS — G912 (Idiopathic) normal pressure hydrocephalus: Secondary | ICD-10-CM | POA: Diagnosis not present

## 2024-01-02 DIAGNOSIS — E039 Hypothyroidism, unspecified: Secondary | ICD-10-CM | POA: Diagnosis not present

## 2024-01-02 DIAGNOSIS — K219 Gastro-esophageal reflux disease without esophagitis: Secondary | ICD-10-CM | POA: Diagnosis not present

## 2024-01-02 DIAGNOSIS — D649 Anemia, unspecified: Secondary | ICD-10-CM | POA: Diagnosis not present

## 2024-01-05 DIAGNOSIS — D649 Anemia, unspecified: Secondary | ICD-10-CM | POA: Diagnosis not present

## 2024-01-05 DIAGNOSIS — G912 (Idiopathic) normal pressure hydrocephalus: Secondary | ICD-10-CM | POA: Diagnosis not present

## 2024-01-05 DIAGNOSIS — G629 Polyneuropathy, unspecified: Secondary | ICD-10-CM | POA: Diagnosis not present

## 2024-01-05 DIAGNOSIS — K219 Gastro-esophageal reflux disease without esophagitis: Secondary | ICD-10-CM | POA: Diagnosis not present

## 2024-01-05 DIAGNOSIS — M80061D Age-related osteoporosis with current pathological fracture, right lower leg, subsequent encounter for fracture with routine healing: Secondary | ICD-10-CM | POA: Diagnosis not present

## 2024-01-05 DIAGNOSIS — G20A1 Parkinson's disease without dyskinesia, without mention of fluctuations: Secondary | ICD-10-CM | POA: Diagnosis not present

## 2024-01-05 DIAGNOSIS — N39498 Other specified urinary incontinence: Secondary | ICD-10-CM | POA: Diagnosis not present

## 2024-01-05 DIAGNOSIS — E039 Hypothyroidism, unspecified: Secondary | ICD-10-CM | POA: Diagnosis not present

## 2024-01-06 DIAGNOSIS — D649 Anemia, unspecified: Secondary | ICD-10-CM | POA: Diagnosis not present

## 2024-01-06 DIAGNOSIS — K219 Gastro-esophageal reflux disease without esophagitis: Secondary | ICD-10-CM | POA: Diagnosis not present

## 2024-01-06 DIAGNOSIS — N39498 Other specified urinary incontinence: Secondary | ICD-10-CM | POA: Diagnosis not present

## 2024-01-06 DIAGNOSIS — G912 (Idiopathic) normal pressure hydrocephalus: Secondary | ICD-10-CM | POA: Diagnosis not present

## 2024-01-06 DIAGNOSIS — G629 Polyneuropathy, unspecified: Secondary | ICD-10-CM | POA: Diagnosis not present

## 2024-01-06 DIAGNOSIS — E039 Hypothyroidism, unspecified: Secondary | ICD-10-CM | POA: Diagnosis not present

## 2024-01-06 DIAGNOSIS — G20A1 Parkinson's disease without dyskinesia, without mention of fluctuations: Secondary | ICD-10-CM | POA: Diagnosis not present

## 2024-01-06 DIAGNOSIS — M80061D Age-related osteoporosis with current pathological fracture, right lower leg, subsequent encounter for fracture with routine healing: Secondary | ICD-10-CM | POA: Diagnosis not present

## 2024-01-10 DIAGNOSIS — G629 Polyneuropathy, unspecified: Secondary | ICD-10-CM | POA: Diagnosis not present

## 2024-01-10 DIAGNOSIS — K219 Gastro-esophageal reflux disease without esophagitis: Secondary | ICD-10-CM | POA: Diagnosis not present

## 2024-01-10 DIAGNOSIS — G20A1 Parkinson's disease without dyskinesia, without mention of fluctuations: Secondary | ICD-10-CM | POA: Diagnosis not present

## 2024-01-10 DIAGNOSIS — D649 Anemia, unspecified: Secondary | ICD-10-CM | POA: Diagnosis not present

## 2024-01-10 DIAGNOSIS — G912 (Idiopathic) normal pressure hydrocephalus: Secondary | ICD-10-CM | POA: Diagnosis not present

## 2024-01-10 DIAGNOSIS — M80061D Age-related osteoporosis with current pathological fracture, right lower leg, subsequent encounter for fracture with routine healing: Secondary | ICD-10-CM | POA: Diagnosis not present

## 2024-01-10 DIAGNOSIS — E039 Hypothyroidism, unspecified: Secondary | ICD-10-CM | POA: Diagnosis not present

## 2024-01-10 DIAGNOSIS — N39498 Other specified urinary incontinence: Secondary | ICD-10-CM | POA: Diagnosis not present

## 2024-01-11 DIAGNOSIS — S82441D Displaced spiral fracture of shaft of right fibula, subsequent encounter for closed fracture with routine healing: Secondary | ICD-10-CM | POA: Diagnosis not present

## 2024-01-11 DIAGNOSIS — M8080XD Other osteoporosis with current pathological fracture, unspecified site, subsequent encounter for fracture with routine healing: Secondary | ICD-10-CM | POA: Diagnosis not present

## 2024-01-12 DIAGNOSIS — M80061D Age-related osteoporosis with current pathological fracture, right lower leg, subsequent encounter for fracture with routine healing: Secondary | ICD-10-CM | POA: Diagnosis not present

## 2024-01-12 DIAGNOSIS — N39498 Other specified urinary incontinence: Secondary | ICD-10-CM | POA: Diagnosis not present

## 2024-01-12 DIAGNOSIS — D649 Anemia, unspecified: Secondary | ICD-10-CM | POA: Diagnosis not present

## 2024-01-12 DIAGNOSIS — G912 (Idiopathic) normal pressure hydrocephalus: Secondary | ICD-10-CM | POA: Diagnosis not present

## 2024-01-12 DIAGNOSIS — E039 Hypothyroidism, unspecified: Secondary | ICD-10-CM | POA: Diagnosis not present

## 2024-01-12 DIAGNOSIS — G629 Polyneuropathy, unspecified: Secondary | ICD-10-CM | POA: Diagnosis not present

## 2024-01-12 DIAGNOSIS — K219 Gastro-esophageal reflux disease without esophagitis: Secondary | ICD-10-CM | POA: Diagnosis not present

## 2024-01-12 DIAGNOSIS — G20A1 Parkinson's disease without dyskinesia, without mention of fluctuations: Secondary | ICD-10-CM | POA: Diagnosis not present

## 2024-01-13 DIAGNOSIS — E039 Hypothyroidism, unspecified: Secondary | ICD-10-CM | POA: Diagnosis not present

## 2024-01-13 DIAGNOSIS — G629 Polyneuropathy, unspecified: Secondary | ICD-10-CM | POA: Diagnosis not present

## 2024-01-13 DIAGNOSIS — G912 (Idiopathic) normal pressure hydrocephalus: Secondary | ICD-10-CM | POA: Diagnosis not present

## 2024-01-13 DIAGNOSIS — D649 Anemia, unspecified: Secondary | ICD-10-CM | POA: Diagnosis not present

## 2024-01-13 DIAGNOSIS — K219 Gastro-esophageal reflux disease without esophagitis: Secondary | ICD-10-CM | POA: Diagnosis not present

## 2024-01-13 DIAGNOSIS — M80061D Age-related osteoporosis with current pathological fracture, right lower leg, subsequent encounter for fracture with routine healing: Secondary | ICD-10-CM | POA: Diagnosis not present

## 2024-01-13 DIAGNOSIS — N39498 Other specified urinary incontinence: Secondary | ICD-10-CM | POA: Diagnosis not present

## 2024-01-13 DIAGNOSIS — G20A1 Parkinson's disease without dyskinesia, without mention of fluctuations: Secondary | ICD-10-CM | POA: Diagnosis not present

## 2024-01-14 DIAGNOSIS — J811 Chronic pulmonary edema: Secondary | ICD-10-CM | POA: Diagnosis not present

## 2024-01-14 DIAGNOSIS — R093 Abnormal sputum: Secondary | ICD-10-CM | POA: Diagnosis not present

## 2024-01-17 DIAGNOSIS — M80061D Age-related osteoporosis with current pathological fracture, right lower leg, subsequent encounter for fracture with routine healing: Secondary | ICD-10-CM | POA: Diagnosis not present

## 2024-01-17 DIAGNOSIS — G912 (Idiopathic) normal pressure hydrocephalus: Secondary | ICD-10-CM | POA: Diagnosis not present

## 2024-01-17 DIAGNOSIS — G20A1 Parkinson's disease without dyskinesia, without mention of fluctuations: Secondary | ICD-10-CM | POA: Diagnosis not present

## 2024-01-17 DIAGNOSIS — S82201D Unspecified fracture of shaft of right tibia, subsequent encounter for closed fracture with routine healing: Secondary | ICD-10-CM | POA: Diagnosis not present

## 2024-01-17 DIAGNOSIS — K219 Gastro-esophageal reflux disease without esophagitis: Secondary | ICD-10-CM | POA: Diagnosis not present

## 2024-01-17 DIAGNOSIS — M25571 Pain in right ankle and joints of right foot: Secondary | ICD-10-CM | POA: Diagnosis not present

## 2024-01-17 DIAGNOSIS — N39498 Other specified urinary incontinence: Secondary | ICD-10-CM | POA: Diagnosis not present

## 2024-01-17 DIAGNOSIS — G629 Polyneuropathy, unspecified: Secondary | ICD-10-CM | POA: Diagnosis not present

## 2024-01-17 DIAGNOSIS — D649 Anemia, unspecified: Secondary | ICD-10-CM | POA: Diagnosis not present

## 2024-01-17 DIAGNOSIS — E039 Hypothyroidism, unspecified: Secondary | ICD-10-CM | POA: Diagnosis not present

## 2024-01-18 DIAGNOSIS — K219 Gastro-esophageal reflux disease without esophagitis: Secondary | ICD-10-CM | POA: Diagnosis not present

## 2024-01-18 DIAGNOSIS — G20A1 Parkinson's disease without dyskinesia, without mention of fluctuations: Secondary | ICD-10-CM | POA: Diagnosis not present

## 2024-01-18 DIAGNOSIS — N39498 Other specified urinary incontinence: Secondary | ICD-10-CM | POA: Diagnosis not present

## 2024-01-18 DIAGNOSIS — M80061D Age-related osteoporosis with current pathological fracture, right lower leg, subsequent encounter for fracture with routine healing: Secondary | ICD-10-CM | POA: Diagnosis not present

## 2024-01-18 DIAGNOSIS — G629 Polyneuropathy, unspecified: Secondary | ICD-10-CM | POA: Diagnosis not present

## 2024-01-18 DIAGNOSIS — D649 Anemia, unspecified: Secondary | ICD-10-CM | POA: Diagnosis not present

## 2024-01-18 DIAGNOSIS — G912 (Idiopathic) normal pressure hydrocephalus: Secondary | ICD-10-CM | POA: Diagnosis not present

## 2024-01-18 DIAGNOSIS — E039 Hypothyroidism, unspecified: Secondary | ICD-10-CM | POA: Diagnosis not present

## 2024-01-22 DIAGNOSIS — Z930 Tracheostomy status: Secondary | ICD-10-CM | POA: Diagnosis not present

## 2024-01-24 DIAGNOSIS — E039 Hypothyroidism, unspecified: Secondary | ICD-10-CM | POA: Diagnosis not present

## 2024-01-24 DIAGNOSIS — G629 Polyneuropathy, unspecified: Secondary | ICD-10-CM | POA: Diagnosis not present

## 2024-01-24 DIAGNOSIS — M80061D Age-related osteoporosis with current pathological fracture, right lower leg, subsequent encounter for fracture with routine healing: Secondary | ICD-10-CM | POA: Diagnosis not present

## 2024-01-24 DIAGNOSIS — D649 Anemia, unspecified: Secondary | ICD-10-CM | POA: Diagnosis not present

## 2024-01-24 DIAGNOSIS — K219 Gastro-esophageal reflux disease without esophagitis: Secondary | ICD-10-CM | POA: Diagnosis not present

## 2024-01-24 DIAGNOSIS — G912 (Idiopathic) normal pressure hydrocephalus: Secondary | ICD-10-CM | POA: Diagnosis not present

## 2024-01-24 DIAGNOSIS — G20A1 Parkinson's disease without dyskinesia, without mention of fluctuations: Secondary | ICD-10-CM | POA: Diagnosis not present

## 2024-01-24 DIAGNOSIS — N39498 Other specified urinary incontinence: Secondary | ICD-10-CM | POA: Diagnosis not present

## 2024-01-25 DIAGNOSIS — N39498 Other specified urinary incontinence: Secondary | ICD-10-CM | POA: Diagnosis not present

## 2024-01-25 DIAGNOSIS — S82101D Unspecified fracture of upper end of right tibia, subsequent encounter for closed fracture with routine healing: Secondary | ICD-10-CM | POA: Diagnosis not present

## 2024-01-25 DIAGNOSIS — G912 (Idiopathic) normal pressure hydrocephalus: Secondary | ICD-10-CM | POA: Diagnosis not present

## 2024-01-25 DIAGNOSIS — E039 Hypothyroidism, unspecified: Secondary | ICD-10-CM | POA: Diagnosis not present

## 2024-01-25 DIAGNOSIS — D649 Anemia, unspecified: Secondary | ICD-10-CM | POA: Diagnosis not present

## 2024-01-25 DIAGNOSIS — G20A1 Parkinson's disease without dyskinesia, without mention of fluctuations: Secondary | ICD-10-CM | POA: Diagnosis not present

## 2024-01-25 DIAGNOSIS — K219 Gastro-esophageal reflux disease without esophagitis: Secondary | ICD-10-CM | POA: Diagnosis not present

## 2024-01-25 DIAGNOSIS — G629 Polyneuropathy, unspecified: Secondary | ICD-10-CM | POA: Diagnosis not present

## 2024-01-25 DIAGNOSIS — M80061D Age-related osteoporosis with current pathological fracture, right lower leg, subsequent encounter for fracture with routine healing: Secondary | ICD-10-CM | POA: Diagnosis not present

## 2024-01-26 DIAGNOSIS — G20A1 Parkinson's disease without dyskinesia, without mention of fluctuations: Secondary | ICD-10-CM | POA: Diagnosis not present

## 2024-01-26 DIAGNOSIS — K219 Gastro-esophageal reflux disease without esophagitis: Secondary | ICD-10-CM | POA: Diagnosis not present

## 2024-01-26 DIAGNOSIS — E039 Hypothyroidism, unspecified: Secondary | ICD-10-CM | POA: Diagnosis not present

## 2024-01-26 DIAGNOSIS — Z430 Encounter for attention to tracheostomy: Secondary | ICD-10-CM | POA: Diagnosis not present

## 2024-01-26 DIAGNOSIS — G629 Polyneuropathy, unspecified: Secondary | ICD-10-CM | POA: Diagnosis not present

## 2024-01-26 DIAGNOSIS — N39498 Other specified urinary incontinence: Secondary | ICD-10-CM | POA: Diagnosis not present

## 2024-01-26 DIAGNOSIS — M80061D Age-related osteoporosis with current pathological fracture, right lower leg, subsequent encounter for fracture with routine healing: Secondary | ICD-10-CM | POA: Diagnosis not present

## 2024-01-26 DIAGNOSIS — G912 (Idiopathic) normal pressure hydrocephalus: Secondary | ICD-10-CM | POA: Diagnosis not present

## 2024-01-26 DIAGNOSIS — D649 Anemia, unspecified: Secondary | ICD-10-CM | POA: Diagnosis not present

## 2024-01-31 DIAGNOSIS — N39498 Other specified urinary incontinence: Secondary | ICD-10-CM | POA: Diagnosis not present

## 2024-01-31 DIAGNOSIS — G20A1 Parkinson's disease without dyskinesia, without mention of fluctuations: Secondary | ICD-10-CM | POA: Diagnosis not present

## 2024-01-31 DIAGNOSIS — D649 Anemia, unspecified: Secondary | ICD-10-CM | POA: Diagnosis not present

## 2024-01-31 DIAGNOSIS — M80061D Age-related osteoporosis with current pathological fracture, right lower leg, subsequent encounter for fracture with routine healing: Secondary | ICD-10-CM | POA: Diagnosis not present

## 2024-01-31 DIAGNOSIS — G912 (Idiopathic) normal pressure hydrocephalus: Secondary | ICD-10-CM | POA: Diagnosis not present

## 2024-01-31 DIAGNOSIS — G629 Polyneuropathy, unspecified: Secondary | ICD-10-CM | POA: Diagnosis not present

## 2024-01-31 DIAGNOSIS — E039 Hypothyroidism, unspecified: Secondary | ICD-10-CM | POA: Diagnosis not present

## 2024-01-31 DIAGNOSIS — K219 Gastro-esophageal reflux disease without esophagitis: Secondary | ICD-10-CM | POA: Diagnosis not present

## 2024-02-01 DIAGNOSIS — Z930 Tracheostomy status: Secondary | ICD-10-CM | POA: Diagnosis not present

## 2024-02-01 DIAGNOSIS — R0602 Shortness of breath: Secondary | ICD-10-CM | POA: Diagnosis not present

## 2024-02-01 DIAGNOSIS — J3802 Paralysis of vocal cords and larynx, bilateral: Secondary | ICD-10-CM | POA: Diagnosis not present

## 2024-02-02 DIAGNOSIS — G912 (Idiopathic) normal pressure hydrocephalus: Secondary | ICD-10-CM | POA: Diagnosis not present

## 2024-02-02 DIAGNOSIS — G629 Polyneuropathy, unspecified: Secondary | ICD-10-CM | POA: Diagnosis not present

## 2024-02-02 DIAGNOSIS — D649 Anemia, unspecified: Secondary | ICD-10-CM | POA: Diagnosis not present

## 2024-02-02 DIAGNOSIS — E039 Hypothyroidism, unspecified: Secondary | ICD-10-CM | POA: Diagnosis not present

## 2024-02-02 DIAGNOSIS — M80061D Age-related osteoporosis with current pathological fracture, right lower leg, subsequent encounter for fracture with routine healing: Secondary | ICD-10-CM | POA: Diagnosis not present

## 2024-02-02 DIAGNOSIS — N39498 Other specified urinary incontinence: Secondary | ICD-10-CM | POA: Diagnosis not present

## 2024-02-02 DIAGNOSIS — K219 Gastro-esophageal reflux disease without esophagitis: Secondary | ICD-10-CM | POA: Diagnosis not present

## 2024-02-02 DIAGNOSIS — G20A1 Parkinson's disease without dyskinesia, without mention of fluctuations: Secondary | ICD-10-CM | POA: Diagnosis not present

## 2024-02-06 DIAGNOSIS — D649 Anemia, unspecified: Secondary | ICD-10-CM | POA: Diagnosis not present

## 2024-02-06 DIAGNOSIS — G629 Polyneuropathy, unspecified: Secondary | ICD-10-CM | POA: Diagnosis not present

## 2024-02-06 DIAGNOSIS — E039 Hypothyroidism, unspecified: Secondary | ICD-10-CM | POA: Diagnosis not present

## 2024-02-06 DIAGNOSIS — N39498 Other specified urinary incontinence: Secondary | ICD-10-CM | POA: Diagnosis not present

## 2024-02-06 DIAGNOSIS — M80061D Age-related osteoporosis with current pathological fracture, right lower leg, subsequent encounter for fracture with routine healing: Secondary | ICD-10-CM | POA: Diagnosis not present

## 2024-02-06 DIAGNOSIS — G20A1 Parkinson's disease without dyskinesia, without mention of fluctuations: Secondary | ICD-10-CM | POA: Diagnosis not present

## 2024-02-06 DIAGNOSIS — K219 Gastro-esophageal reflux disease without esophagitis: Secondary | ICD-10-CM | POA: Diagnosis not present

## 2024-02-06 DIAGNOSIS — G912 (Idiopathic) normal pressure hydrocephalus: Secondary | ICD-10-CM | POA: Diagnosis not present

## 2024-02-07 DIAGNOSIS — G629 Polyneuropathy, unspecified: Secondary | ICD-10-CM | POA: Diagnosis not present

## 2024-02-07 DIAGNOSIS — D649 Anemia, unspecified: Secondary | ICD-10-CM | POA: Diagnosis not present

## 2024-02-07 DIAGNOSIS — K219 Gastro-esophageal reflux disease without esophagitis: Secondary | ICD-10-CM | POA: Diagnosis not present

## 2024-02-07 DIAGNOSIS — G20A1 Parkinson's disease without dyskinesia, without mention of fluctuations: Secondary | ICD-10-CM | POA: Diagnosis not present

## 2024-02-07 DIAGNOSIS — M80061D Age-related osteoporosis with current pathological fracture, right lower leg, subsequent encounter for fracture with routine healing: Secondary | ICD-10-CM | POA: Diagnosis not present

## 2024-02-07 DIAGNOSIS — G912 (Idiopathic) normal pressure hydrocephalus: Secondary | ICD-10-CM | POA: Diagnosis not present

## 2024-02-07 DIAGNOSIS — N39498 Other specified urinary incontinence: Secondary | ICD-10-CM | POA: Diagnosis not present

## 2024-02-07 DIAGNOSIS — E039 Hypothyroidism, unspecified: Secondary | ICD-10-CM | POA: Diagnosis not present

## 2024-02-09 ENCOUNTER — Ambulatory Visit: Admitting: Adult Health

## 2024-02-09 DIAGNOSIS — M80061D Age-related osteoporosis with current pathological fracture, right lower leg, subsequent encounter for fracture with routine healing: Secondary | ICD-10-CM | POA: Diagnosis not present

## 2024-02-09 DIAGNOSIS — G629 Polyneuropathy, unspecified: Secondary | ICD-10-CM | POA: Diagnosis not present

## 2024-02-09 DIAGNOSIS — G912 (Idiopathic) normal pressure hydrocephalus: Secondary | ICD-10-CM | POA: Diagnosis not present

## 2024-02-09 DIAGNOSIS — E039 Hypothyroidism, unspecified: Secondary | ICD-10-CM | POA: Diagnosis not present

## 2024-02-09 DIAGNOSIS — G20A1 Parkinson's disease without dyskinesia, without mention of fluctuations: Secondary | ICD-10-CM | POA: Diagnosis not present

## 2024-02-09 DIAGNOSIS — N39498 Other specified urinary incontinence: Secondary | ICD-10-CM | POA: Diagnosis not present

## 2024-02-09 DIAGNOSIS — K219 Gastro-esophageal reflux disease without esophagitis: Secondary | ICD-10-CM | POA: Diagnosis not present

## 2024-02-09 DIAGNOSIS — D649 Anemia, unspecified: Secondary | ICD-10-CM | POA: Diagnosis not present

## 2024-02-10 ENCOUNTER — Ambulatory Visit: Admitting: Adult Health

## 2024-02-10 DIAGNOSIS — I872 Venous insufficiency (chronic) (peripheral): Secondary | ICD-10-CM | POA: Diagnosis not present

## 2024-02-10 DIAGNOSIS — R1313 Dysphagia, pharyngeal phase: Secondary | ICD-10-CM | POA: Diagnosis not present

## 2024-02-10 DIAGNOSIS — R0989 Other specified symptoms and signs involving the circulatory and respiratory systems: Secondary | ICD-10-CM | POA: Diagnosis not present

## 2024-02-10 DIAGNOSIS — G909 Disorder of the autonomic nervous system, unspecified: Secondary | ICD-10-CM | POA: Diagnosis not present

## 2024-02-10 DIAGNOSIS — F411 Generalized anxiety disorder: Secondary | ICD-10-CM | POA: Diagnosis not present

## 2024-02-10 DIAGNOSIS — U071 COVID-19: Secondary | ICD-10-CM | POA: Diagnosis not present

## 2024-02-10 DIAGNOSIS — M7989 Other specified soft tissue disorders: Secondary | ICD-10-CM | POA: Diagnosis not present

## 2024-02-10 DIAGNOSIS — G912 (Idiopathic) normal pressure hydrocephalus: Secondary | ICD-10-CM | POA: Diagnosis not present

## 2024-02-10 DIAGNOSIS — G20A1 Parkinson's disease without dyskinesia, without mention of fluctuations: Secondary | ICD-10-CM | POA: Diagnosis not present

## 2024-02-12 DIAGNOSIS — G912 (Idiopathic) normal pressure hydrocephalus: Secondary | ICD-10-CM | POA: Diagnosis not present

## 2024-02-13 DIAGNOSIS — E039 Hypothyroidism, unspecified: Secondary | ICD-10-CM | POA: Diagnosis not present

## 2024-02-13 DIAGNOSIS — G912 (Idiopathic) normal pressure hydrocephalus: Secondary | ICD-10-CM | POA: Diagnosis not present

## 2024-02-13 DIAGNOSIS — N39498 Other specified urinary incontinence: Secondary | ICD-10-CM | POA: Diagnosis not present

## 2024-02-13 DIAGNOSIS — K219 Gastro-esophageal reflux disease without esophagitis: Secondary | ICD-10-CM | POA: Diagnosis not present

## 2024-02-13 DIAGNOSIS — M80061D Age-related osteoporosis with current pathological fracture, right lower leg, subsequent encounter for fracture with routine healing: Secondary | ICD-10-CM | POA: Diagnosis not present

## 2024-02-13 DIAGNOSIS — G629 Polyneuropathy, unspecified: Secondary | ICD-10-CM | POA: Diagnosis not present

## 2024-02-13 DIAGNOSIS — G20A1 Parkinson's disease without dyskinesia, without mention of fluctuations: Secondary | ICD-10-CM | POA: Diagnosis not present

## 2024-02-13 DIAGNOSIS — D649 Anemia, unspecified: Secondary | ICD-10-CM | POA: Diagnosis not present

## 2024-02-14 ENCOUNTER — Ambulatory Visit: Admitting: Adult Health

## 2024-02-16 DIAGNOSIS — G912 (Idiopathic) normal pressure hydrocephalus: Secondary | ICD-10-CM | POA: Diagnosis not present

## 2024-02-21 DIAGNOSIS — S82201D Unspecified fracture of shaft of right tibia, subsequent encounter for closed fracture with routine healing: Secondary | ICD-10-CM | POA: Diagnosis not present

## 2024-02-21 DIAGNOSIS — Z930 Tracheostomy status: Secondary | ICD-10-CM | POA: Diagnosis not present

## 2024-02-21 DIAGNOSIS — M25571 Pain in right ankle and joints of right foot: Secondary | ICD-10-CM | POA: Diagnosis not present

## 2024-02-22 DIAGNOSIS — S82831D Other fracture of upper and lower end of right fibula, subsequent encounter for closed fracture with routine healing: Secondary | ICD-10-CM | POA: Diagnosis not present

## 2024-02-22 DIAGNOSIS — Z9181 History of falling: Secondary | ICD-10-CM | POA: Diagnosis not present

## 2024-02-22 DIAGNOSIS — J3800 Paralysis of vocal cords and larynx, unspecified: Secondary | ICD-10-CM | POA: Diagnosis not present

## 2024-02-22 DIAGNOSIS — I10 Essential (primary) hypertension: Secondary | ICD-10-CM | POA: Diagnosis not present

## 2024-02-22 DIAGNOSIS — Z930 Tracheostomy status: Secondary | ICD-10-CM | POA: Diagnosis not present

## 2024-02-22 DIAGNOSIS — G912 (Idiopathic) normal pressure hydrocephalus: Secondary | ICD-10-CM | POA: Diagnosis not present

## 2024-02-22 DIAGNOSIS — S82301D Unspecified fracture of lower end of right tibia, subsequent encounter for closed fracture with routine healing: Secondary | ICD-10-CM | POA: Diagnosis not present

## 2024-02-22 DIAGNOSIS — G20A1 Parkinson's disease without dyskinesia, without mention of fluctuations: Secondary | ICD-10-CM | POA: Diagnosis not present

## 2024-02-22 DIAGNOSIS — Z604 Social exclusion and rejection: Secondary | ICD-10-CM | POA: Diagnosis not present

## 2024-02-23 DIAGNOSIS — E039 Hypothyroidism, unspecified: Secondary | ICD-10-CM | POA: Diagnosis not present

## 2024-02-24 DIAGNOSIS — M80061D Age-related osteoporosis with current pathological fracture, right lower leg, subsequent encounter for fracture with routine healing: Secondary | ICD-10-CM | POA: Diagnosis not present

## 2024-02-24 DIAGNOSIS — E039 Hypothyroidism, unspecified: Secondary | ICD-10-CM | POA: Diagnosis not present

## 2024-02-24 DIAGNOSIS — S82101D Unspecified fracture of upper end of right tibia, subsequent encounter for closed fracture with routine healing: Secondary | ICD-10-CM | POA: Diagnosis not present

## 2024-02-24 DIAGNOSIS — K219 Gastro-esophageal reflux disease without esophagitis: Secondary | ICD-10-CM | POA: Diagnosis not present

## 2024-02-24 DIAGNOSIS — G20A1 Parkinson's disease without dyskinesia, without mention of fluctuations: Secondary | ICD-10-CM | POA: Diagnosis not present

## 2024-02-24 DIAGNOSIS — G629 Polyneuropathy, unspecified: Secondary | ICD-10-CM | POA: Diagnosis not present

## 2024-02-24 DIAGNOSIS — D649 Anemia, unspecified: Secondary | ICD-10-CM | POA: Diagnosis not present

## 2024-02-24 DIAGNOSIS — N39498 Other specified urinary incontinence: Secondary | ICD-10-CM | POA: Diagnosis not present

## 2024-02-24 DIAGNOSIS — G912 (Idiopathic) normal pressure hydrocephalus: Secondary | ICD-10-CM | POA: Diagnosis not present

## 2024-02-27 DIAGNOSIS — G20A1 Parkinson's disease without dyskinesia, without mention of fluctuations: Secondary | ICD-10-CM | POA: Diagnosis not present

## 2024-02-27 DIAGNOSIS — J3800 Paralysis of vocal cords and larynx, unspecified: Secondary | ICD-10-CM | POA: Diagnosis not present

## 2024-02-27 DIAGNOSIS — Z9181 History of falling: Secondary | ICD-10-CM | POA: Diagnosis not present

## 2024-02-27 DIAGNOSIS — Z604 Social exclusion and rejection: Secondary | ICD-10-CM | POA: Diagnosis not present

## 2024-02-27 DIAGNOSIS — G912 (Idiopathic) normal pressure hydrocephalus: Secondary | ICD-10-CM | POA: Diagnosis not present

## 2024-02-27 DIAGNOSIS — S82301D Unspecified fracture of lower end of right tibia, subsequent encounter for closed fracture with routine healing: Secondary | ICD-10-CM | POA: Diagnosis not present

## 2024-02-27 DIAGNOSIS — I10 Essential (primary) hypertension: Secondary | ICD-10-CM | POA: Diagnosis not present

## 2024-02-27 DIAGNOSIS — S82831D Other fracture of upper and lower end of right fibula, subsequent encounter for closed fracture with routine healing: Secondary | ICD-10-CM | POA: Diagnosis not present

## 2024-02-27 DIAGNOSIS — Z930 Tracheostomy status: Secondary | ICD-10-CM | POA: Diagnosis not present

## 2024-02-29 DIAGNOSIS — Z01810 Encounter for preprocedural cardiovascular examination: Secondary | ICD-10-CM | POA: Diagnosis not present

## 2024-02-29 DIAGNOSIS — Z01818 Encounter for other preprocedural examination: Secondary | ICD-10-CM | POA: Diagnosis not present

## 2024-02-29 DIAGNOSIS — E039 Hypothyroidism, unspecified: Secondary | ICD-10-CM | POA: Diagnosis not present

## 2024-03-01 DIAGNOSIS — Z430 Encounter for attention to tracheostomy: Secondary | ICD-10-CM | POA: Diagnosis not present

## 2024-03-02 DIAGNOSIS — G912 (Idiopathic) normal pressure hydrocephalus: Secondary | ICD-10-CM | POA: Diagnosis not present

## 2024-03-02 DIAGNOSIS — I10 Essential (primary) hypertension: Secondary | ICD-10-CM | POA: Diagnosis not present

## 2024-03-02 DIAGNOSIS — Z604 Social exclusion and rejection: Secondary | ICD-10-CM | POA: Diagnosis not present

## 2024-03-02 DIAGNOSIS — S82831D Other fracture of upper and lower end of right fibula, subsequent encounter for closed fracture with routine healing: Secondary | ICD-10-CM | POA: Diagnosis not present

## 2024-03-02 DIAGNOSIS — Z930 Tracheostomy status: Secondary | ICD-10-CM | POA: Diagnosis not present

## 2024-03-02 DIAGNOSIS — J3800 Paralysis of vocal cords and larynx, unspecified: Secondary | ICD-10-CM | POA: Diagnosis not present

## 2024-03-02 DIAGNOSIS — Z9181 History of falling: Secondary | ICD-10-CM | POA: Diagnosis not present

## 2024-03-02 DIAGNOSIS — G20A1 Parkinson's disease without dyskinesia, without mention of fluctuations: Secondary | ICD-10-CM | POA: Diagnosis not present

## 2024-03-05 ENCOUNTER — Encounter: Payer: Self-pay | Admitting: Adult Health

## 2024-03-05 ENCOUNTER — Ambulatory Visit (INDEPENDENT_AMBULATORY_CARE_PROVIDER_SITE_OTHER): Admitting: Adult Health

## 2024-03-05 DIAGNOSIS — F411 Generalized anxiety disorder: Secondary | ICD-10-CM | POA: Diagnosis not present

## 2024-03-05 DIAGNOSIS — Z930 Tracheostomy status: Secondary | ICD-10-CM | POA: Diagnosis not present

## 2024-03-05 DIAGNOSIS — Z9181 History of falling: Secondary | ICD-10-CM | POA: Diagnosis not present

## 2024-03-05 DIAGNOSIS — S82301D Unspecified fracture of lower end of right tibia, subsequent encounter for closed fracture with routine healing: Secondary | ICD-10-CM | POA: Diagnosis not present

## 2024-03-05 DIAGNOSIS — G20A1 Parkinson's disease without dyskinesia, without mention of fluctuations: Secondary | ICD-10-CM | POA: Diagnosis not present

## 2024-03-05 DIAGNOSIS — I10 Essential (primary) hypertension: Secondary | ICD-10-CM | POA: Diagnosis not present

## 2024-03-05 DIAGNOSIS — G912 (Idiopathic) normal pressure hydrocephalus: Secondary | ICD-10-CM | POA: Diagnosis not present

## 2024-03-05 DIAGNOSIS — Z604 Social exclusion and rejection: Secondary | ICD-10-CM | POA: Diagnosis not present

## 2024-03-05 DIAGNOSIS — J3800 Paralysis of vocal cords and larynx, unspecified: Secondary | ICD-10-CM | POA: Diagnosis not present

## 2024-03-05 DIAGNOSIS — S82831D Other fracture of upper and lower end of right fibula, subsequent encounter for closed fracture with routine healing: Secondary | ICD-10-CM | POA: Diagnosis not present

## 2024-03-05 MED ORDER — GABAPENTIN 100 MG PO CAPS: ORAL_CAPSULE | ORAL | 1 refills | Status: AC

## 2024-03-05 NOTE — Progress Notes (Signed)
 [name] [id number] 06-12-1950 73 y.o.  Subjective:   Patient ID:  Morgan Reese is a 73 y.o. (DOB [birth year]) female.  Chief Complaint: No chief complaint on file.   HPI JAONNA WORD presents to the office today for follow-up of anxiety disorder.  Accompanied by husband.  Describes mood today as ok. Pleasant. Denies tearfulness. Mood symptoms - reports some depression, anxiety and irritability. Reports stable interest, but lacks motivation. Reports occasional panic attacks - I had one yesterday. Reports some worry, rumination and over thinking. Reports mood as stable. Stating I feel like I'm doing alright. Feels like medications are helpful. Taking medications as prescribed. Energy levels lower. Active, does not a regular exercise routine. Enjoys some usual interests and activities. Married - lives with husband. Has 2 adult children. Spending time with family.  Appetite adequate. Weight stable - 120 pounds. Sleeps well most nights. Averages 6 hours. Reports evening napping for an hour. Focus and concentration stable. Completing tasks. Managing aspects of household. Retired. Denies SI or HI.  Denies AH or VH. Denies self harm. Denies substance use. Denies paranoia.   Flowsheet Row Admission (Discharged) from 12/01/2023 in [facility] from 05/10/2023 in [facility] Emergency Department at [facility] ED from 04/21/2022 in [facility] Emergency Department at [facility]  C-SSRS RISK CATEGORY No Risk No Risk No Risk     Review of Systems:  Review of Systems  Musculoskeletal:  Negative for gait problem.  Neurological:  Negative for tremors.  Psychiatric/Behavioral:         Please refer to HPI    Medications: I have reviewed the patient's current medications.  Current Outpatient Medications  Medication Sig Dispense Refill   acetaminophen  (TYLENOL ) 500 MG tablet Take 2 tablets (1,000 mg total) by mouth  every 8 (eight) hours as needed.     Ascorbic Acid (VITAMIN C) 1000 MG tablet Take 1,000 mg by mouth daily.     Cholecalciferol (VITAMIN D3 PO) Take by mouth. Alternates 10,000 units with 5,000 units each day.     COPPER PO Take 30 mg by mouth 2 (two) times a week.     cyclobenzaprine  (FLEXERIL ) 10 MG tablet Take 10 mg by mouth 3 (three) times daily as needed.     estradiol  (ESTRACE ) 0.1 MG/GM vaginal cream Place 1 Applicatorful vaginally at bedtime.     estradiol  (VIVELLE -DOT) 0.0375 MG/24HR Place 1 patch onto the skin 2 (two) times a week.     gabapentin  (NEURONTIN ) 100 MG capsule Take two capsules three times daily. 540 capsule 1   levothyroxine  (SYNTHROID ) 50 MCG tablet Take 50 mcg by mouth daily before breakfast.     Magnesium Citrate 200 MG TABS Take 600 mg by mouth daily.     melatonin 3 MG TABS tablet Take 6-9 mg by mouth at bedtime.     Omega-3 Fatty Acids (FISH OIL) 1000 MG CAPS Take 1 capsule by mouth at bedtime.     phenazopyridine (PYRIDIUM) 200 MG tablet Take 200 mg by mouth 3 (three) times daily as needed (burning on urination).     polyethylene glycol (MIRALAX  / GLYCOLAX ) 17 g packet Take 17 g by mouth 2 (two) times daily.     polyethylene glycol (MIRALAX  / GLYCOLAX ) 17 g packet Take 17 g by mouth daily as needed for mild constipation. 14 each 0   pramipexole  (MIRAPEX ) 1 MG tablet Take 1 tablet (1 mg total) by mouth 3 (three) times daily.  progesterone  (PROMETRIUM ) 100 MG capsule Take 200 mg by mouth at bedtime.     pyridOXINE  (B-6) 50 MG tablet Take 50 mg by mouth daily.     thiamine 100 MG tablet Take 100 mg by mouth daily.     VITAMIN K PO Take by mouth daily. MK7     zinc  gluconate 50 MG tablet Take 50 mg by mouth daily.     No current facility-administered medications for this visit.    Medication Side Effects: None  Allergies:  Allergies  Allergen Reactions   Amoxicillin Swelling and Other (See Comments)    REACTION: Head felt over-heated  Other reaction(s):  Other (See Comments)  REACTION: Head felt over-heated    REACTION: Head felt over-heated Other reaction(s): Other (See Comments) REACTION: Head felt over-heated   Gluten Meal Other (See Comments)   Latex Other (See Comments)    Skin irritation   Serotonin Other (See Comments)    Other reaction(s): hyponatremia SSRI's are contraindicated, causes hyponatremia    SSRI's are contraindicated, causes hyponatremia  Other reaction(s): hyponatremia SSRI's are contraindicated, causes hyponatremia  Other reaction(s): hyponatremia SSRI's are contraindicated, causes hyponatremia  SSRI's are contraindicated, causes hyponatremia  Other reaction(s): hyponatremia SSRI's are contraindicated, causes hyponatremia   Serotonin Reuptake Inhibitors (Ssris) Other (See Comments)    Other reaction(s): hyponatremia SSRI's are contraindicated, causes hyponatremia     Past Medical History:  Diagnosis Date   Hypertension    Multiple system atrophy (HCC)    Parkinson's disease (HCC)    Sinus congestion    Thyroid  disease     Past Medical History, Surgical history, Social history, and Family history were reviewed and updated as appropriate.   Please see review of systems for further details on the patient's review from today.   Objective:   Physical Exam:  There were no vitals taken for this visit.  Physical Exam Constitutional:      General: She is not in acute distress. Musculoskeletal:        General: No deformity.  Neurological:     Mental Status: She is alert and oriented to person, place, and time.     Coordination: Coordination normal.  Psychiatric:        Attention and Perception: Attention and perception normal. She does not perceive auditory or visual hallucinations.        Mood and Affect: Mood normal. Mood is not anxious or depressed. Affect is not labile, blunt, angry or inappropriate.        Speech: Speech normal.        Behavior: Behavior normal.        Thought Content: Thought  content normal. Thought content is not paranoid or delusional. Thought content does not include homicidal or suicidal ideation. Thought content does not include homicidal or suicidal plan.        Cognition and Memory: Cognition and memory normal.        Judgment: Judgment normal.     Comments: Insight intact     Lab Review:     Component Value Date/Time   NA 137 12/07/2023 0700   K 3.8 12/07/2023 0700   CL 103 12/07/2023 0700   CO2 24 12/07/2023 0700   GLUCOSE 94 12/07/2023 0700   BUN 11 12/07/2023 0700   CREATININE 0.51 12/07/2023 0700   CALCIUM 8.6 (L) 12/07/2023 0700   PROT 5.3 (L) 12/04/2023 0818   ALBUMIN 2.8 (L) 12/04/2023 0818   AST 19 12/04/2023 0818   ALT 10 12/04/2023 0818  ALKPHOS 50 12/04/2023 0818   BILITOT 0.8 12/04/2023 0818   GFRNONAA >60 12/07/2023 0700       Component Value Date/Time   WBC 4.0 12/07/2023 0700   RBC 3.45 (L) 12/07/2023 0700   HGB 10.5 (L) 12/07/2023 0700   HCT 31.5 (L) 12/07/2023 0700   PLT 191 12/07/2023 0700   MCV 91.3 12/07/2023 0700   MCH 30.4 12/07/2023 0700   MCHC 33.3 12/07/2023 0700   RDW 13.0 12/07/2023 0700   LYMPHSABS 1.2 12/04/2023 0818   MONOABS 0.4 12/04/2023 0818   EOSABS 0.2 12/04/2023 0818   BASOSABS 0.1 12/04/2023 0818    No results found for: POCLITH, LITHIUM   No results found for: PHENYTOIN, PHENOBARB, VALPROATE, CBMZ   .res Assessment: Plan:    Plan:  Working with [facility] neurologist   Gabapentin  300mg  TID to 200mg  TID anxiety  PDMP reviewed  RTC 3 months  25 minutes spent dedicated to the care of this patient on the date of this encounter to include pre-visit review of records, ordering of medication, post visit documentation, and face-to-face time with the patient discussing GAD. Discussed continuing current medication regimen.  Diagnoses and all orders for this visit:  Generalized anxiety disorder -     gabapentin  (NEURONTIN ) 100 MG capsule; Take two capsules three times daily.      Please see After Visit Summary for patient specific instructions.  No future appointments.   No orders of the defined types were placed in this encounter.   -------------------------------

## 2024-03-06 DIAGNOSIS — R1312 Dysphagia, oropharyngeal phase: Secondary | ICD-10-CM | POA: Diagnosis not present

## 2024-03-07 DIAGNOSIS — J398 Other specified diseases of upper respiratory tract: Secondary | ICD-10-CM | POA: Diagnosis not present

## 2024-03-07 DIAGNOSIS — G629 Polyneuropathy, unspecified: Secondary | ICD-10-CM | POA: Diagnosis not present

## 2024-03-07 DIAGNOSIS — G20C Parkinsonism, unspecified: Secondary | ICD-10-CM | POA: Diagnosis not present

## 2024-03-07 DIAGNOSIS — E039 Hypothyroidism, unspecified: Secondary | ICD-10-CM | POA: Diagnosis not present

## 2024-03-07 DIAGNOSIS — J3089 Other allergic rhinitis: Secondary | ICD-10-CM | POA: Diagnosis not present

## 2024-03-07 DIAGNOSIS — Z930 Tracheostomy status: Secondary | ICD-10-CM | POA: Diagnosis not present

## 2024-03-07 DIAGNOSIS — G912 (Idiopathic) normal pressure hydrocephalus: Secondary | ICD-10-CM | POA: Diagnosis not present

## 2024-03-07 DIAGNOSIS — M8080XD Other osteoporosis with current pathological fracture, unspecified site, subsequent encounter for fracture with routine healing: Secondary | ICD-10-CM | POA: Diagnosis not present

## 2024-03-07 DIAGNOSIS — N951 Menopausal and female climacteric states: Secondary | ICD-10-CM | POA: Diagnosis not present

## 2024-03-08 DIAGNOSIS — I10 Essential (primary) hypertension: Secondary | ICD-10-CM | POA: Diagnosis not present

## 2024-03-08 DIAGNOSIS — G912 (Idiopathic) normal pressure hydrocephalus: Secondary | ICD-10-CM | POA: Diagnosis not present

## 2024-03-08 DIAGNOSIS — Z604 Social exclusion and rejection: Secondary | ICD-10-CM | POA: Diagnosis not present

## 2024-03-08 DIAGNOSIS — Z930 Tracheostomy status: Secondary | ICD-10-CM | POA: Diagnosis not present

## 2024-03-08 DIAGNOSIS — S82831D Other fracture of upper and lower end of right fibula, subsequent encounter for closed fracture with routine healing: Secondary | ICD-10-CM | POA: Diagnosis not present

## 2024-03-08 DIAGNOSIS — J3800 Paralysis of vocal cords and larynx, unspecified: Secondary | ICD-10-CM | POA: Diagnosis not present

## 2024-03-08 DIAGNOSIS — G20A1 Parkinson's disease without dyskinesia, without mention of fluctuations: Secondary | ICD-10-CM | POA: Diagnosis not present

## 2024-03-08 DIAGNOSIS — Z9181 History of falling: Secondary | ICD-10-CM | POA: Diagnosis not present

## 2024-03-08 DIAGNOSIS — S82301D Unspecified fracture of lower end of right tibia, subsequent encounter for closed fracture with routine healing: Secondary | ICD-10-CM | POA: Diagnosis not present

## 2024-03-12 DIAGNOSIS — S82831D Other fracture of upper and lower end of right fibula, subsequent encounter for closed fracture with routine healing: Secondary | ICD-10-CM | POA: Diagnosis not present

## 2024-03-12 DIAGNOSIS — Z604 Social exclusion and rejection: Secondary | ICD-10-CM | POA: Diagnosis not present

## 2024-03-12 DIAGNOSIS — S82301D Unspecified fracture of lower end of right tibia, subsequent encounter for closed fracture with routine healing: Secondary | ICD-10-CM | POA: Diagnosis not present

## 2024-03-12 DIAGNOSIS — I10 Essential (primary) hypertension: Secondary | ICD-10-CM | POA: Diagnosis not present

## 2024-03-12 DIAGNOSIS — Z9181 History of falling: Secondary | ICD-10-CM | POA: Diagnosis not present

## 2024-03-12 DIAGNOSIS — G20A1 Parkinson's disease without dyskinesia, without mention of fluctuations: Secondary | ICD-10-CM | POA: Diagnosis not present

## 2024-03-12 DIAGNOSIS — J3800 Paralysis of vocal cords and larynx, unspecified: Secondary | ICD-10-CM | POA: Diagnosis not present

## 2024-03-12 DIAGNOSIS — Z930 Tracheostomy status: Secondary | ICD-10-CM | POA: Diagnosis not present

## 2024-03-12 DIAGNOSIS — G912 (Idiopathic) normal pressure hydrocephalus: Secondary | ICD-10-CM | POA: Diagnosis not present

## 2024-03-14 DIAGNOSIS — G912 (Idiopathic) normal pressure hydrocephalus: Secondary | ICD-10-CM | POA: Diagnosis not present

## 2024-03-14 DIAGNOSIS — G9389 Other specified disorders of brain: Secondary | ICD-10-CM | POA: Diagnosis not present

## 2024-03-14 DIAGNOSIS — I10 Essential (primary) hypertension: Secondary | ICD-10-CM | POA: Diagnosis not present

## 2024-03-14 DIAGNOSIS — N3941 Urge incontinence: Secondary | ICD-10-CM | POA: Diagnosis not present

## 2024-03-14 DIAGNOSIS — Z79890 Hormone replacement therapy (postmenopausal): Secondary | ICD-10-CM | POA: Diagnosis not present

## 2024-03-14 DIAGNOSIS — G910 Communicating hydrocephalus: Secondary | ICD-10-CM | POA: Diagnosis not present

## 2024-03-14 DIAGNOSIS — Z982 Presence of cerebrospinal fluid drainage device: Secondary | ICD-10-CM | POA: Diagnosis not present

## 2024-03-14 DIAGNOSIS — I872 Venous insufficiency (chronic) (peripheral): Secondary | ICD-10-CM | POA: Diagnosis not present

## 2024-03-14 DIAGNOSIS — E039 Hypothyroidism, unspecified: Secondary | ICD-10-CM | POA: Diagnosis not present

## 2024-03-15 NOTE — Discharge Summary (Signed)
 Neurosurgery Discharge Summary  Patient ID: Morgan Reese [id number] 73 y.o. 1951/05/08  Admit date: 03/14/2024 Admitting Physician: Toribio Dallas Snuffer, MD Admission Diagnoses:   NPH (normal pressure hydrocephalus) [G91.2] Normal pressure hydrocephalus    (CMD) [G91.2] S/p tracheostomy  Discharge date: 03/15/24  Problem List[1]  Medical History[2]  Discharged Condition: stable  Indications for Admission: Morgan Reese has worsening gait instability, significant urinary incontinence and bladder urgency, and short-term and long-term memory difficulties. Neuroimaging studies demonstrate ventriculomegaly consistent with normal pressure hydrocephalus. A high volume LP was performed with good results. She was admitted for planned surgical intervention.   Hospital Course:  Morgan Reese has a history of communicating normal-pressure hydrocephalus and was admitted to [facility] on 03/14/2024. The patient was taken to the operating room for insertion of right frontal ventriculoperitoneal shunt. She tolerated the procedure well and without complications. She recovered in Postanesthesia Care Unit and was transferred to the Neurosurgery floor. She was started on appropriate pain medications and bowel protocol. She continued to progress well and meet surgical milestones. On the day of discharge, she reports adequate pain control with an oral pain medication regimen, is ambulating with a walker, voiding, and tolerating house diet without difficulty. She is amenable to the plan for discharge. She was discharged on 03/15/24.   Procedures/Surgeries performed during hospitalization: 1) Insertion of right frontal ventriculoperitoneal shunt (Codman Certas Plus -- programmed at setting 4 of 1-8 possible). 2) Use of the neuropen endoscope for placement of the proximal catheter With Dr. Toribio Dallas Snuffer on 03/14/24.   Shunt setting: Certas at 4  Discharge Exam: Alert and oriented x4, NAD,  cooperative  Speech is hypophonic and appropriate to context PERRL, EOMI Facial symmetry on activation Tongue midline Hearing grossly intact   Delt Bi Tri Gr  L 5 5 5 5   R 5 5 3 5      HF KE DF PF  L 5 5 0 *0  R 5 5 5 5   *baseline right foot drop No pronator drift Sensation intact to light touch on face, hands, and feet Head incision c/d/I with headwrap and absorbable sutures Abdominal incision c/d/I with primapore and absorbable sutures  Disposition: home  Patient Instructions:    Discharge Medications     New Medications      Sig Disp Refill Start End  HYDROcodone-acetaminophen  5-325 mg per tablet Commonly known as: NORCO  Take 1-2 tablets by mouth every 6 (six) hours as needed (Take 1 tablet for moderate (4-6) or take 2 tablets for severe (7-10) post op pain).  25 tablet  0     methocarbamoL 750 mg tablet Commonly known as: ROBAXIN  Take 1 tablet (750 mg total) by mouth 3 (three) times a day as needed for muscle spasms.  20 tablet  0         Modified Medications      Sig Disp Refill Start End  acetaminophen  500 mg tablet Commonly known as: TYLENOL  What changed:  when to take this reasons to take this  Take 2 tablets (1,000 mg total) by mouth every 6 (six) hours as needed for mild pain (1-3), headaches or fever 100.4 F or GREATER.   0         Medications To Continue      Sig Disp Refill Start End  ALPRAZolam  0.5 mg tablet Commonly known as: XANAX   Take 0.5 mg by mouth as needed in the morning and 0.5 mg as needed in the evening.   0  ascorbic acid 1,000 mg tablet Commonly known as: VITAMIN C  Take 500 mg by mouth 2 (two) times a day.   0     cholecalciferol 125 mcg (5,000 unit) capsule Commonly known as: VITAMIN D3  Take 5,000 Units by mouth daily.   0     * estradioL  0.0375 mg/24 hr patch Commonly known as: CLIMARA   Place 1 patch on the skin 2 (two) times a week.   0     * estradioL  0.01 % (0.1 mg/gram) vaginal cream Commonly  known as: ESTRACE   Insert 0.5 g into the vagina Once Daily.  42 g  2     Fish OiL 340-1,000 mg Cap capsule Generic drug: omega-3 fatty acids-fish oil  Take 1 g by mouth nightly.   0     fluticasone  propionate 50 mcg/spray nasal spray Commonly known as: FLONASE   Administer 1 spray into each nostril daily as needed for allergies.   0     gabapentin  300 mg capsule Commonly known as: NEURONTIN   Take 300 mg by mouth 3 (three) times a day Indications: neuropathic pain. 1 tab TID   0     levothyroxine  50 mcg tablet Commonly known as: SYNTHROID   Take 50 mcg by mouth daily.   0     melatonin 10 mg tablet  Take 1 tablet (10 mg total) by mouth at bedtime. Can take 2 tablets at night   0     mfolate calcium-mecobalamin 25,000 mcg DFE- 2,000 mcg Cap  Take 1 capsule by mouth daily.   0     polyethylene glycol 17 gram packet Commonly known as: GLYCOLAX   Take 17 g by mouth daily as needed for constipation.   0     pramipexole  0.5 mg tablet Commonly known as: MIRAPEX   Take 1 mg by mouth 3 (three) times a day Indications: Parkinson's disease. 2 tabs TID   0     progesterone  100 mg Cap capsule Commonly known as: PROMETRIUM   Take 200 mg by mouth at bedtime.   0     pyridoxine  50 mg tablet Commonly known as: VITAMIN B6  Take 50 mg by mouth daily.   0     thiamine 100 mg tablet Commonly known as: VITAMIN B1  Take 100 mg by mouth daily.   0     vitamin K2 100 mcg Cap  Take 100 mcg by mouth daily.   0     zinc  gluconate 50 mg Tab tablet  Take 50 mg by mouth daily.   0        * * There are duplicate medications prescribed to the patient          Stopped Medications    traMADoL 50 mg tablet Commonly known as: ULTRAM       Discharge Orders     Activity Instructions:     Details:    Activity Limits:  No biking, ATVs, or contact sports See Patient Instructions     Patient Instructions: No Strenuous activity   Discharge Instructions - Bathing-Showering      Details:    Tub Bath or Shower Instructions: Do not soak in tub   Comments: Your incision is closed with  absorbable sutures. You may shower and get the sutures wet 72 hours after surgery but do not submerge/soak your incision into water ie bath tub, swimming pool etc until after your sutures have absorbed and you have your follow up visit.   Discharge instructions - Other (specify)  Comments: Call the doctor if you have:  - Problems moving your arm(s), leg(s), or both.  - Problems speaking or understanding what is said to you.  - Severe headaches not relieved with pain medicines.  - Persistent nausea, vomiting, or both. - Temperature greater than 101.5 - Changes in mental status, increased confusion or sleepiness.   Neurosurgery Clinic number: [phone]   Driving Limits:     Comments: No driving while on narcotic pain medication or until released at your follow up visit   Full Code     Lifting Limits:     Details:    Lifting Limits: Do NOT lift more than 10 lbs for:   Lifting Limit Duration: 2 weeks   Other Restrictions:     Comments: Avoid mixing alcohol  and narcotic pain medication.   You may need a stool softer while on narcotic pain medication to avoid constipation.   It is normal to feel tired after surgery, slowly increase your activity daily.   Return to previous diet     Details:    Diet type: Return to previous diet   Wound or Incision Care Instructions     Details:    Wound or Incision Site 1: Head   Wound or Incision Care Instruction Site 1:  Call doctor if incision is red or drains Bruising is normal Clean with soap and water No powders or deodorants No creams, ointments, or lotions     Comments: If you are discharged with surgical dressing in place it can be removed 2 days after your surgery and left open to air      Scheduled Future Appointments       Provider Department Dept Phone Center   03/28/2024 10:30 AM Bernarda JAYSON Louder [facility] [place] [address] Neurosurgery [phone] [facility] [place]   04/26/2024 1:00 PM [facility] VASC US  ROOM 4 [facility]  - Vascular and Endovascular Surgery [phone] [facility] [place]   04/26/2024 3:00 PM Pavel JINNY Lipps [facility] [place] 92 Vascular Medicine [phone] [facility] [place]   05/02/2024 10:00 AM Gustav Gosselin [facility] [place] 95 Neurosurgery [phone] [facility] [place]   05/02/2024 1:40 PM Garnette JAYSON Brien Mickey. [facility] [place] - MPM ENT [phone] [facility] [place]   09/05/2024 1:30 PM Recardo Damien Dames [facility] - MPM Outpatient Speech Language Pathology [phone] [facility] [place]   09/05/2024 1:30 PM SLP MPM 02 FEES RM 3 [facility] - [place]PM Outpatient Speech Language Pathology [phone] [facility] [place]      Total time spent on discharge: 35 minutes       [1] Patient Active Problem List Diagnosis  . Hoarseness  . Multiple system atrophy P  . NPH (normal pressure hydrocephalus)  . Pharyngeal dysphagia  . Parkinson's disease without dyskinesia or fluctuating manifestations    (CMD)  . Hypothyroidism  . Generalized anxiety disorder  . Tracheostomy dependence    (CMD)  . Stenosis of trachea  . COVID  . Swollen leg  . Venous insufficiency  . Autonomic dysfunction  . Labile blood pressure  . Hypotension  . Normal pressure hydrocephalus    (CMD)  [2] Past Medical History: Diagnosis Date  . Cancer    (CMD) Skin most recent this spring 2024  . Cluster headache [employer] 4th grade  . Difficulty walking   . Head injury Childhood falls  .  Headache, tension-type 4th grade  . Hypertension   . Movement disorder   . Peripheral neuropathy Spring2023  . Shingles Fall 2022  . Thyroid  disease    Thyroiditis  . Vision loss   *Some images could not be shown.

## 2024-03-16 DIAGNOSIS — Z604 Social exclusion and rejection: Secondary | ICD-10-CM | POA: Diagnosis not present

## 2024-03-16 DIAGNOSIS — G20A1 Parkinson's disease without dyskinesia, without mention of fluctuations: Secondary | ICD-10-CM | POA: Diagnosis not present

## 2024-03-16 DIAGNOSIS — S82301D Unspecified fracture of lower end of right tibia, subsequent encounter for closed fracture with routine healing: Secondary | ICD-10-CM | POA: Diagnosis not present

## 2024-03-16 DIAGNOSIS — S82831D Other fracture of upper and lower end of right fibula, subsequent encounter for closed fracture with routine healing: Secondary | ICD-10-CM | POA: Diagnosis not present

## 2024-03-16 DIAGNOSIS — Z930 Tracheostomy status: Secondary | ICD-10-CM | POA: Diagnosis not present

## 2024-03-16 DIAGNOSIS — J3800 Paralysis of vocal cords and larynx, unspecified: Secondary | ICD-10-CM | POA: Diagnosis not present

## 2024-03-16 DIAGNOSIS — Z9181 History of falling: Secondary | ICD-10-CM | POA: Diagnosis not present

## 2024-03-16 DIAGNOSIS — I10 Essential (primary) hypertension: Secondary | ICD-10-CM | POA: Diagnosis not present

## 2024-03-16 DIAGNOSIS — G912 (Idiopathic) normal pressure hydrocephalus: Secondary | ICD-10-CM | POA: Diagnosis not present

## 2024-03-21 DIAGNOSIS — R399 Unspecified symptoms and signs involving the genitourinary system: Secondary | ICD-10-CM | POA: Diagnosis not present

## 2024-03-22 DIAGNOSIS — Z930 Tracheostomy status: Secondary | ICD-10-CM | POA: Diagnosis not present

## 2024-03-22 DIAGNOSIS — I10 Essential (primary) hypertension: Secondary | ICD-10-CM | POA: Diagnosis not present

## 2024-03-22 DIAGNOSIS — G912 (Idiopathic) normal pressure hydrocephalus: Secondary | ICD-10-CM | POA: Diagnosis not present

## 2024-03-22 DIAGNOSIS — Z604 Social exclusion and rejection: Secondary | ICD-10-CM | POA: Diagnosis not present

## 2024-03-22 DIAGNOSIS — G20A1 Parkinson's disease without dyskinesia, without mention of fluctuations: Secondary | ICD-10-CM | POA: Diagnosis not present

## 2024-03-22 DIAGNOSIS — J3800 Paralysis of vocal cords and larynx, unspecified: Secondary | ICD-10-CM | POA: Diagnosis not present

## 2024-03-22 DIAGNOSIS — Z9181 History of falling: Secondary | ICD-10-CM | POA: Diagnosis not present

## 2024-03-22 DIAGNOSIS — S82301D Unspecified fracture of lower end of right tibia, subsequent encounter for closed fracture with routine healing: Secondary | ICD-10-CM | POA: Diagnosis not present

## 2024-03-22 DIAGNOSIS — S82831D Other fracture of upper and lower end of right fibula, subsequent encounter for closed fracture with routine healing: Secondary | ICD-10-CM | POA: Diagnosis not present

## 2024-03-23 DIAGNOSIS — Z930 Tracheostomy status: Secondary | ICD-10-CM | POA: Diagnosis not present

## 2024-03-26 DIAGNOSIS — S82101D Unspecified fracture of upper end of right tibia, subsequent encounter for closed fracture with routine healing: Secondary | ICD-10-CM | POA: Diagnosis not present

## 2024-03-28 NOTE — Progress Notes (Addendum)
   Morgan Reese is a 73 y.o. female who presents for a post-op visit and wound check following a VP shunt placement on 03/14/24 with Dr. Vona. She denies any uncontrolled pain, redness, drainage, or swelling from the surgical wound.  Patient reports that she continues to have frequent episodes of dizziness after surgery. [name], NP notified and [facility] ordered to be done prior to her next follow visit in December. She follows cardiology and has an appointment scheduled with them in December as well.   Surgical incision noted is healing well, without evidence of infection.  1. wound closure: absorbable sutures noted to be clean, dry, and intact  2. Wound care discussed. Alerted to watch for any signs of infection (redness, pus, pain, increased swelling or fever) and call if such occurs.   3. Follow up as previously scheduled. Ok to wash and pat dry with a clean dry towel. No hair cut or coloring for the next 4 weeks.  Do not submerge incision.  Signs and symptoms to monitor for were reviewed. If any of these changes are demonstrated, her family will contact us  for reevaluation.      Electronically signed by: Bernarda JAYSON Louder, RN 03/28/2024 10:18 AM

## 2024-03-29 DIAGNOSIS — G20A1 Parkinson's disease without dyskinesia, without mention of fluctuations: Secondary | ICD-10-CM | POA: Diagnosis not present

## 2024-03-29 DIAGNOSIS — S82301D Unspecified fracture of lower end of right tibia, subsequent encounter for closed fracture with routine healing: Secondary | ICD-10-CM | POA: Diagnosis not present

## 2024-03-29 DIAGNOSIS — S82831D Other fracture of upper and lower end of right fibula, subsequent encounter for closed fracture with routine healing: Secondary | ICD-10-CM | POA: Diagnosis not present

## 2024-03-29 DIAGNOSIS — Z9181 History of falling: Secondary | ICD-10-CM | POA: Diagnosis not present

## 2024-03-29 DIAGNOSIS — Z930 Tracheostomy status: Secondary | ICD-10-CM | POA: Diagnosis not present

## 2024-03-29 DIAGNOSIS — Z604 Social exclusion and rejection: Secondary | ICD-10-CM | POA: Diagnosis not present

## 2024-03-29 DIAGNOSIS — J3800 Paralysis of vocal cords and larynx, unspecified: Secondary | ICD-10-CM | POA: Diagnosis not present

## 2024-03-29 DIAGNOSIS — I10 Essential (primary) hypertension: Secondary | ICD-10-CM | POA: Diagnosis not present

## 2024-03-29 DIAGNOSIS — G912 (Idiopathic) normal pressure hydrocephalus: Secondary | ICD-10-CM | POA: Diagnosis not present

## 2024-04-03 DIAGNOSIS — E039 Hypothyroidism, unspecified: Secondary | ICD-10-CM | POA: Diagnosis not present

## 2024-04-03 DIAGNOSIS — S82201D Unspecified fracture of shaft of right tibia, subsequent encounter for closed fracture with routine healing: Secondary | ICD-10-CM | POA: Diagnosis not present

## 2024-04-03 DIAGNOSIS — Z982 Presence of cerebrospinal fluid drainage device: Secondary | ICD-10-CM | POA: Diagnosis not present

## 2024-04-03 DIAGNOSIS — G912 (Idiopathic) normal pressure hydrocephalus: Secondary | ICD-10-CM | POA: Diagnosis not present

## 2024-04-05 DIAGNOSIS — Z604 Social exclusion and rejection: Secondary | ICD-10-CM | POA: Diagnosis not present

## 2024-04-05 DIAGNOSIS — Z930 Tracheostomy status: Secondary | ICD-10-CM | POA: Diagnosis not present

## 2024-04-05 DIAGNOSIS — S82831D Other fracture of upper and lower end of right fibula, subsequent encounter for closed fracture with routine healing: Secondary | ICD-10-CM | POA: Diagnosis not present

## 2024-04-05 DIAGNOSIS — S82301D Unspecified fracture of lower end of right tibia, subsequent encounter for closed fracture with routine healing: Secondary | ICD-10-CM | POA: Diagnosis not present

## 2024-04-05 DIAGNOSIS — I10 Essential (primary) hypertension: Secondary | ICD-10-CM | POA: Diagnosis not present

## 2024-04-05 DIAGNOSIS — J3800 Paralysis of vocal cords and larynx, unspecified: Secondary | ICD-10-CM | POA: Diagnosis not present

## 2024-04-05 DIAGNOSIS — Z9181 History of falling: Secondary | ICD-10-CM | POA: Diagnosis not present

## 2024-04-05 DIAGNOSIS — G20A1 Parkinson's disease without dyskinesia, without mention of fluctuations: Secondary | ICD-10-CM | POA: Diagnosis not present

## 2024-04-05 DIAGNOSIS — G912 (Idiopathic) normal pressure hydrocephalus: Secondary | ICD-10-CM | POA: Diagnosis not present

## 2024-04-11 DIAGNOSIS — Z430 Encounter for attention to tracheostomy: Secondary | ICD-10-CM | POA: Diagnosis not present

## 2024-04-12 DIAGNOSIS — J3800 Paralysis of vocal cords and larynx, unspecified: Secondary | ICD-10-CM | POA: Diagnosis not present

## 2024-04-12 DIAGNOSIS — I10 Essential (primary) hypertension: Secondary | ICD-10-CM | POA: Diagnosis not present

## 2024-04-12 DIAGNOSIS — Z9181 History of falling: Secondary | ICD-10-CM | POA: Diagnosis not present

## 2024-04-12 DIAGNOSIS — S82831D Other fracture of upper and lower end of right fibula, subsequent encounter for closed fracture with routine healing: Secondary | ICD-10-CM | POA: Diagnosis not present

## 2024-04-12 DIAGNOSIS — G912 (Idiopathic) normal pressure hydrocephalus: Secondary | ICD-10-CM | POA: Diagnosis not present

## 2024-04-12 DIAGNOSIS — S82301D Unspecified fracture of lower end of right tibia, subsequent encounter for closed fracture with routine healing: Secondary | ICD-10-CM | POA: Diagnosis not present

## 2024-04-12 DIAGNOSIS — Z604 Social exclusion and rejection: Secondary | ICD-10-CM | POA: Diagnosis not present

## 2024-04-12 DIAGNOSIS — Z930 Tracheostomy status: Secondary | ICD-10-CM | POA: Diagnosis not present

## 2024-04-12 DIAGNOSIS — G20A1 Parkinson's disease without dyskinesia, without mention of fluctuations: Secondary | ICD-10-CM | POA: Diagnosis not present

## 2024-04-18 DIAGNOSIS — R42 Dizziness and giddiness: Secondary | ICD-10-CM | POA: Diagnosis not present

## 2024-04-18 DIAGNOSIS — Z982 Presence of cerebrospinal fluid drainage device: Secondary | ICD-10-CM | POA: Diagnosis not present

## 2024-04-18 DIAGNOSIS — G912 (Idiopathic) normal pressure hydrocephalus: Secondary | ICD-10-CM | POA: Diagnosis not present

## 2024-04-22 DIAGNOSIS — Z930 Tracheostomy status: Secondary | ICD-10-CM | POA: Diagnosis not present

## 2024-04-25 DIAGNOSIS — S82101D Unspecified fracture of upper end of right tibia, subsequent encounter for closed fracture with routine healing: Secondary | ICD-10-CM | POA: Diagnosis not present

## 2024-04-26 DIAGNOSIS — I872 Venous insufficiency (chronic) (peripheral): Secondary | ICD-10-CM | POA: Diagnosis not present

## 2024-04-26 DIAGNOSIS — G20A1 Parkinson's disease without dyskinesia, without mention of fluctuations: Secondary | ICD-10-CM | POA: Diagnosis not present

## 2024-04-26 DIAGNOSIS — G909 Disorder of the autonomic nervous system, unspecified: Secondary | ICD-10-CM | POA: Diagnosis not present

## 2024-04-26 DIAGNOSIS — G232 Striatonigral degeneration: Secondary | ICD-10-CM | POA: Diagnosis not present

## 2024-04-26 DIAGNOSIS — I959 Hypotension, unspecified: Secondary | ICD-10-CM | POA: Diagnosis not present

## 2024-05-02 DIAGNOSIS — Z982 Presence of cerebrospinal fluid drainage device: Secondary | ICD-10-CM | POA: Diagnosis not present

## 2024-05-02 DIAGNOSIS — G912 (Idiopathic) normal pressure hydrocephalus: Secondary | ICD-10-CM | POA: Diagnosis not present

## 2024-06-07 ENCOUNTER — Telehealth: Admitting: Adult Health

## 2024-06-07 ENCOUNTER — Encounter: Payer: Self-pay | Admitting: Adult Health

## 2024-06-07 DIAGNOSIS — Z0389 Encounter for observation for other suspected diseases and conditions ruled out: Secondary | ICD-10-CM

## 2024-06-07 NOTE — Progress Notes (Signed)
 Patient unable to connect to video - tried phone call, but was unable to complete interview today with patient's inability to speak/communicate due to physical limitations. Spoke with husband and they will set up an in person appointment.

## 2024-09-04 ENCOUNTER — Ambulatory Visit: Admitting: Adult Health

## 2024-09-05 ENCOUNTER — Ambulatory Visit: Admitting: Adult Health
